# Patient Record
Sex: Female | Born: 1955
Health system: Southern US, Community
[De-identification: ages and names within clinical notes are randomized; demographics above are authoritative.]

## PROBLEM LIST (undated history)

## (undated) DIAGNOSIS — K219 Gastro-esophageal reflux disease without esophagitis: Secondary | ICD-10-CM

## (undated) DIAGNOSIS — I10 Essential (primary) hypertension: Secondary | ICD-10-CM

## (undated) DIAGNOSIS — K5900 Constipation, unspecified: Secondary | ICD-10-CM

## (undated) DIAGNOSIS — R002 Palpitations: Secondary | ICD-10-CM

## (undated) DIAGNOSIS — M549 Dorsalgia, unspecified: Secondary | ICD-10-CM

## (undated) DIAGNOSIS — E7800 Pure hypercholesterolemia, unspecified: Secondary | ICD-10-CM

## (undated) DIAGNOSIS — F32A Depression, unspecified: Secondary | ICD-10-CM

## (undated) DIAGNOSIS — N200 Calculus of kidney: Secondary | ICD-10-CM

## (undated) DIAGNOSIS — E559 Vitamin D deficiency, unspecified: Secondary | ICD-10-CM

## (undated) DIAGNOSIS — F419 Anxiety disorder, unspecified: Secondary | ICD-10-CM

## (undated) DIAGNOSIS — R609 Edema, unspecified: Secondary | ICD-10-CM

## (undated) DIAGNOSIS — R079 Chest pain, unspecified: Secondary | ICD-10-CM

## (undated) DIAGNOSIS — M2550 Pain in unspecified joint: Secondary | ICD-10-CM

## (undated) HISTORY — DX: Edema, unspecified: R60.9

## (undated) HISTORY — DX: Vitamin D deficiency, unspecified: E55.9

## (undated) HISTORY — DX: Constipation, unspecified: K59.00

## (undated) HISTORY — PX: NASAL SEPTUM SURGERY: SHX37

## (undated) HISTORY — DX: Dorsalgia, unspecified: M54.9

## (undated) HISTORY — DX: Gastro-esophageal reflux disease without esophagitis: K21.9

## (undated) HISTORY — DX: Palpitations: R00.2

## (undated) HISTORY — DX: Calculus of kidney: N20.0

## (undated) HISTORY — PX: ABDOMINAL HYSTERECTOMY: SHX81

## (undated) HISTORY — DX: Pure hypercholesterolemia, unspecified: E78.00

## (undated) HISTORY — PX: TONSILLECTOMY: SUR1361

## (undated) HISTORY — PX: FOOT SURGERY: SHX648

## (undated) HISTORY — DX: Chest pain, unspecified: R07.9

## (undated) HISTORY — DX: Essential (primary) hypertension: I10

## (undated) HISTORY — DX: Pain in unspecified joint: M25.50

## (undated) HISTORY — DX: Anxiety disorder, unspecified: F41.9

## (undated) HISTORY — DX: Depression, unspecified: F32.A

---

## 1997-06-26 ENCOUNTER — Other Ambulatory Visit: Admission: RE | Admit: 1997-06-26 | Discharge: 1997-06-26 | Payer: Self-pay | Admitting: Gynecology

## 1998-04-09 ENCOUNTER — Inpatient Hospital Stay (HOSPITAL_COMMUNITY): Admission: RE | Admit: 1998-04-09 | Discharge: 1998-04-11 | Payer: Self-pay | Admitting: Gynecology

## 2000-05-03 ENCOUNTER — Other Ambulatory Visit: Admission: RE | Admit: 2000-05-03 | Discharge: 2000-05-03 | Payer: Self-pay | Admitting: Gynecology

## 2001-05-24 ENCOUNTER — Other Ambulatory Visit: Admission: RE | Admit: 2001-05-24 | Discharge: 2001-05-24 | Payer: Self-pay | Admitting: Gynecology

## 2002-06-27 ENCOUNTER — Other Ambulatory Visit: Admission: RE | Admit: 2002-06-27 | Discharge: 2002-06-27 | Payer: Self-pay | Admitting: Gynecology

## 2004-06-05 ENCOUNTER — Other Ambulatory Visit: Admission: RE | Admit: 2004-06-05 | Discharge: 2004-06-05 | Payer: Self-pay | Admitting: Gynecology

## 2004-10-08 ENCOUNTER — Ambulatory Visit (HOSPITAL_COMMUNITY): Admission: RE | Admit: 2004-10-08 | Discharge: 2004-10-08 | Payer: Self-pay | Admitting: Urology

## 2004-10-15 ENCOUNTER — Ambulatory Visit (HOSPITAL_COMMUNITY): Admission: RE | Admit: 2004-10-15 | Discharge: 2004-10-15 | Payer: Self-pay | Admitting: Urology

## 2005-01-29 ENCOUNTER — Ambulatory Visit (HOSPITAL_COMMUNITY): Admission: RE | Admit: 2005-01-29 | Discharge: 2005-01-29 | Payer: Self-pay | Admitting: Urology

## 2005-01-30 ENCOUNTER — Ambulatory Visit: Payer: Self-pay | Admitting: Cardiology

## 2005-02-05 ENCOUNTER — Ambulatory Visit (HOSPITAL_COMMUNITY): Admission: RE | Admit: 2005-02-05 | Discharge: 2005-02-05 | Payer: Self-pay | Admitting: Urology

## 2006-03-18 ENCOUNTER — Other Ambulatory Visit: Admission: RE | Admit: 2006-03-18 | Discharge: 2006-03-18 | Payer: Self-pay | Admitting: Family Medicine

## 2006-04-06 ENCOUNTER — Ambulatory Visit: Payer: Self-pay | Admitting: Cardiology

## 2006-04-19 ENCOUNTER — Ambulatory Visit: Payer: Self-pay

## 2006-05-17 ENCOUNTER — Ambulatory Visit: Payer: Self-pay | Admitting: Cardiology

## 2006-09-27 IMAGING — CR DG ABDOMEN 1V
2 series · 2 of 2 positions shown · non-contrast
Comparison: none available

HISTORY: left renal calculus, pre lithotripsy

ABDOMEN ONE VIEW:

[view not recorded (1 of 2)]
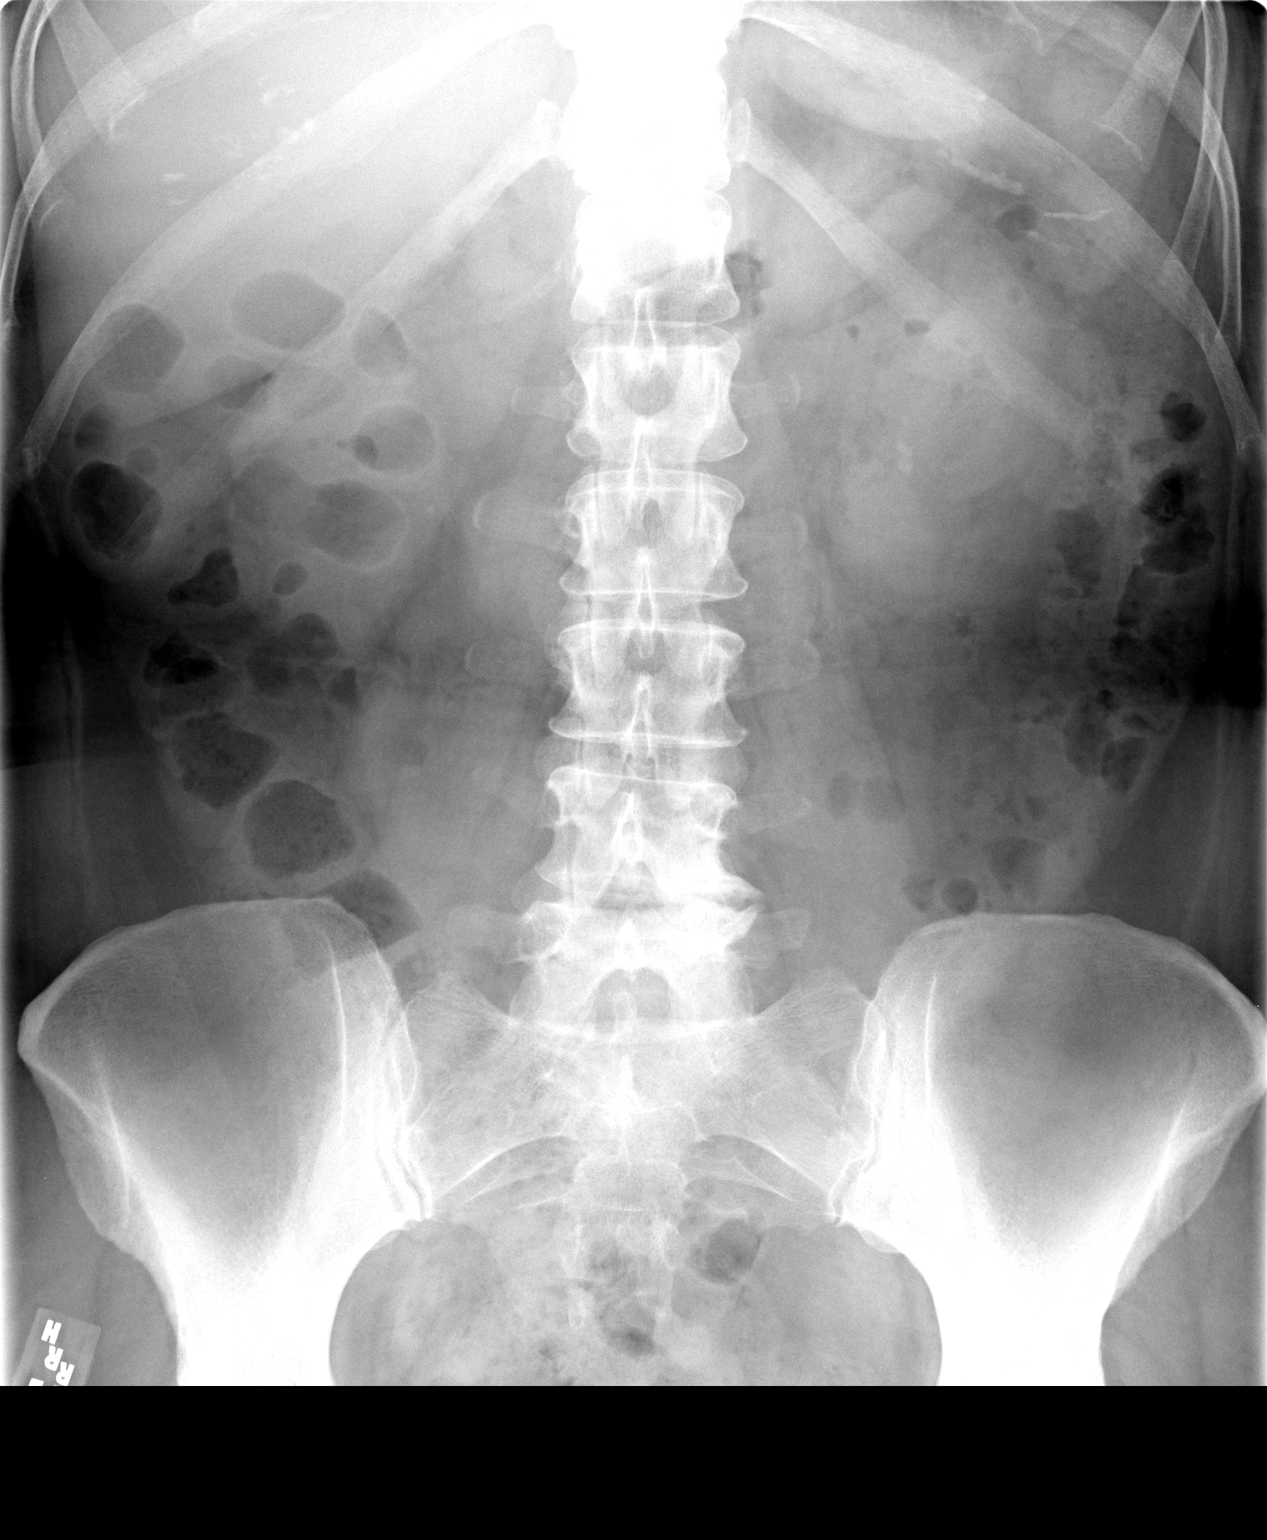

[view not recorded (2 of 2)]
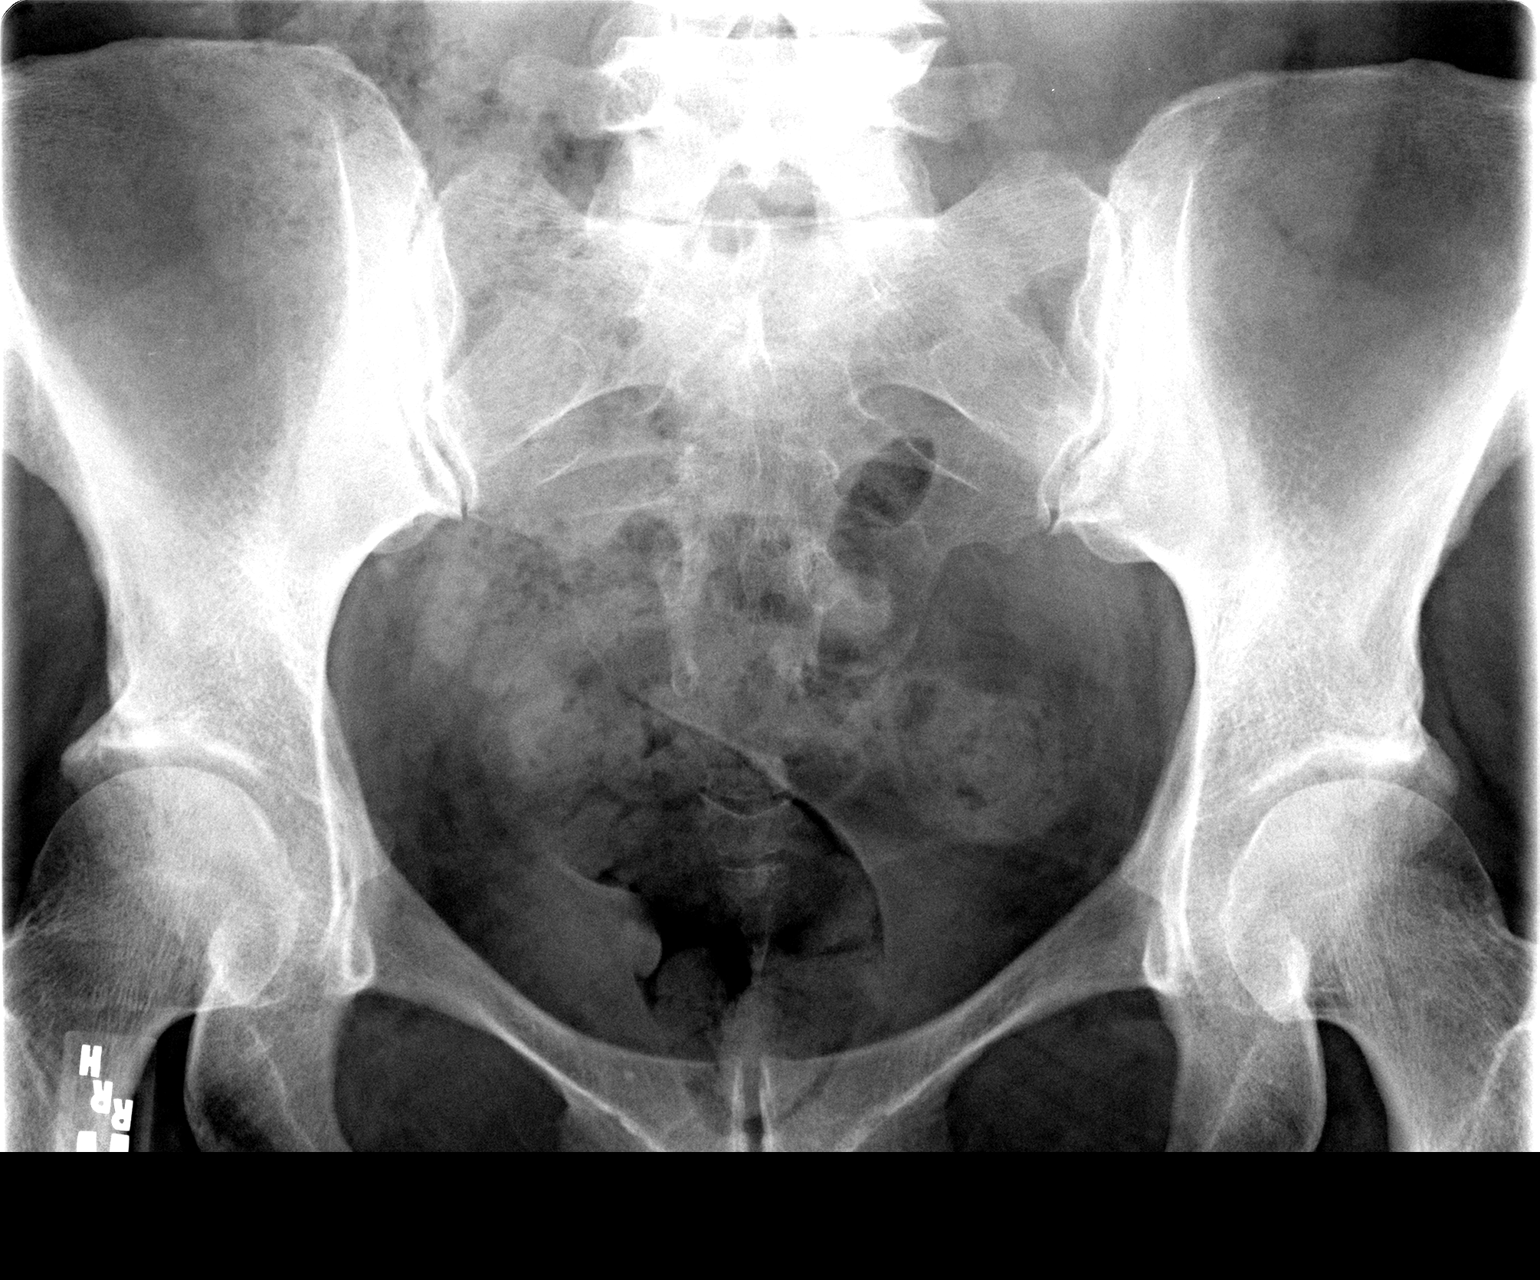

[2 of 2 positions shown; findings below may reference images not displayed]

10 x 4 mm diameter calculus lower pole left kidney.
No ureteral calcification.
Questionable tiny calcification at right renal pelvis, 1 to 2 mm diameter.
Bowel gas pattern normal.
Degenerative disc disease changes lumbar spine at L4-L5.
No acute bone lesion.
IMPRESSION: 10 x 4 mm diameter left renal calculus.
Question tiny right renal calculus.

## 2007-01-18 IMAGING — CR DG ABDOMEN 1V
1 series · 1 of 1 positions shown · non-contrast
Comparison: none

CLINICAL DATA: Left ureteral calculus.  Pre-op lithotripsy.
 ABDOMEN ? 1 VIEW ? 01/29/05:

[t abdomen supine]
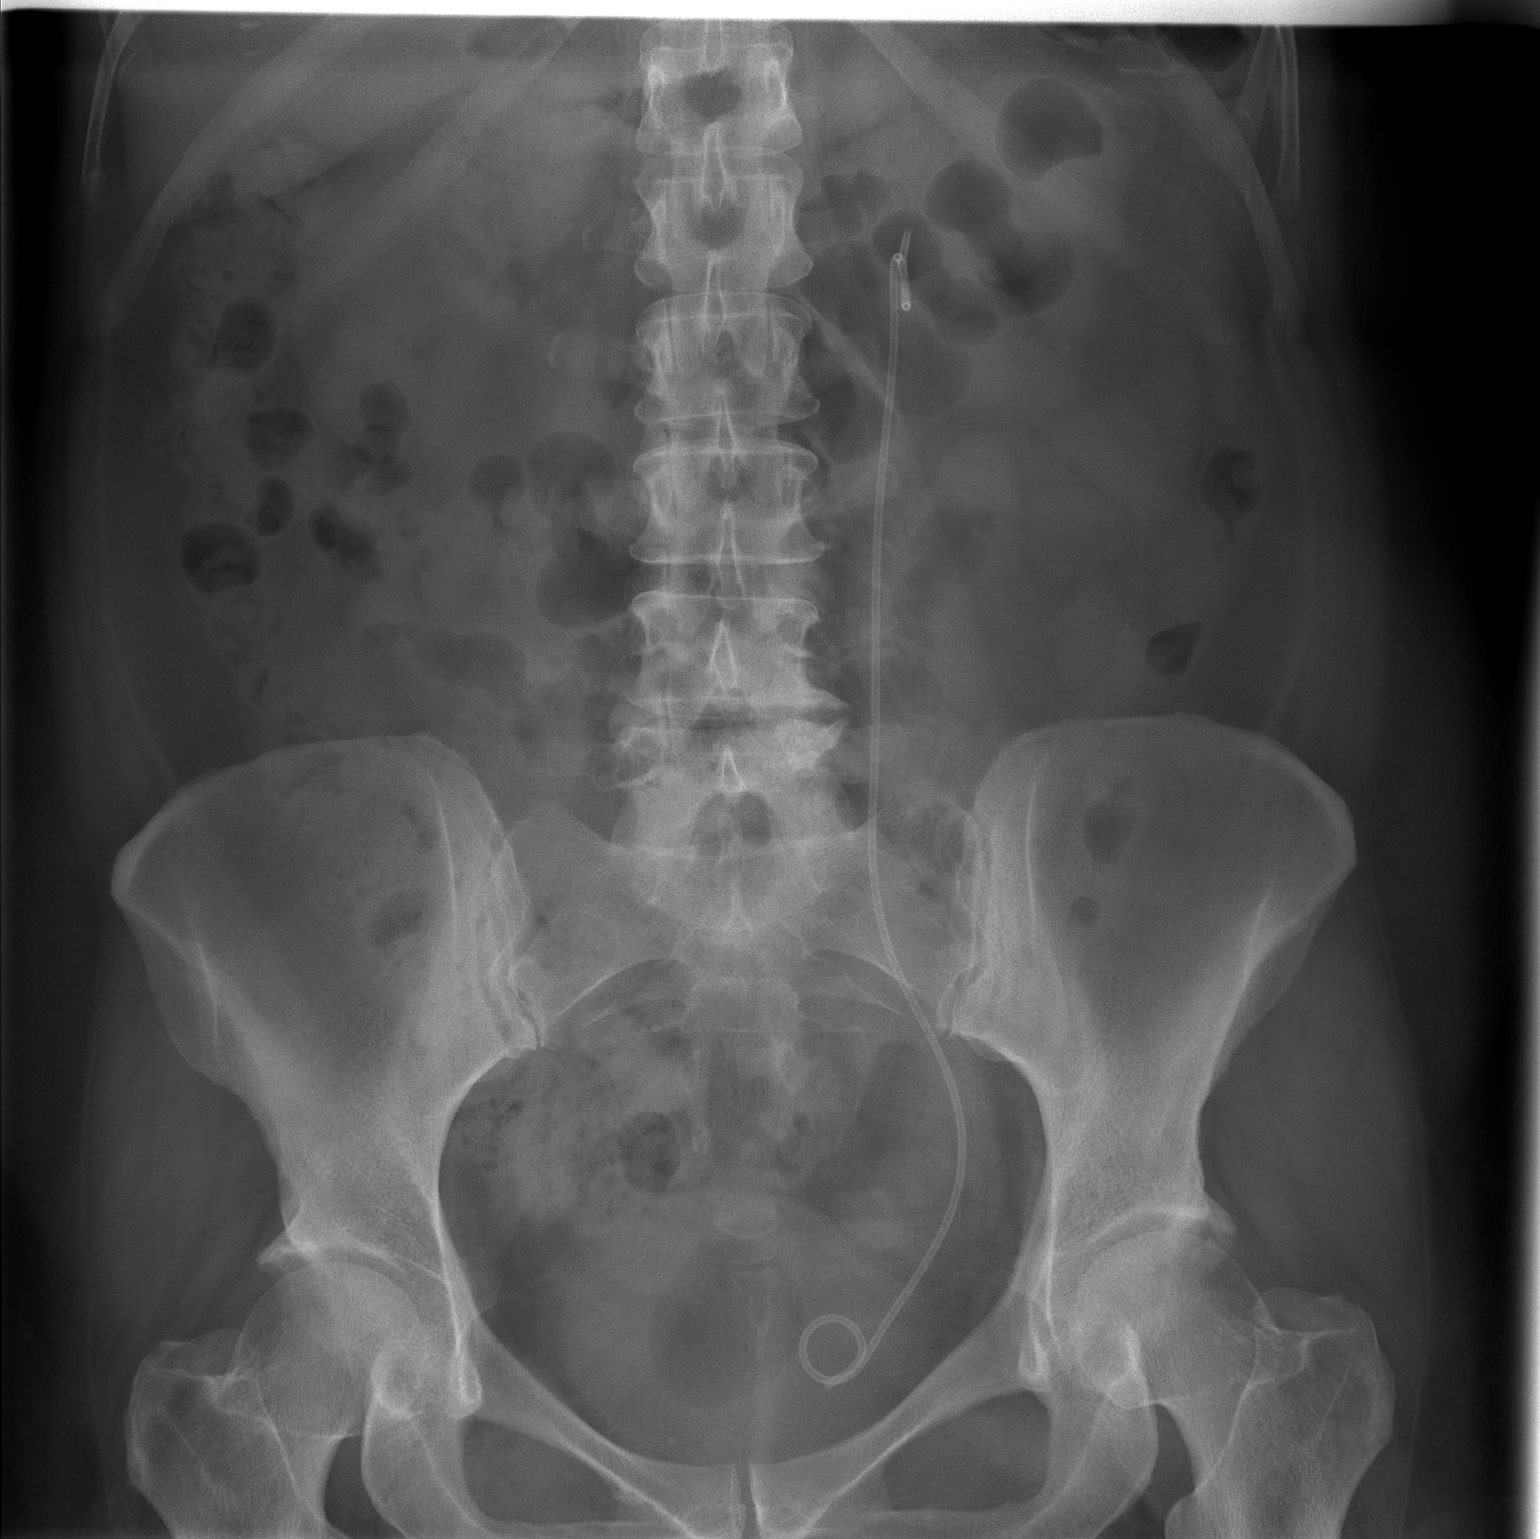

[1 of 1 positions shown; findings below may reference images not displayed]

FINDINGS: AP view of the abdomen reveals a Double-J catheter to be in position on the left.
IMPRESSION: Left ureteral catheter.

## 2007-01-18 IMAGING — RF DG RETROGRADE PYELOGRAM
1 series · 10 of 10 positions shown · non-contrast
Comparison: none

CLINICAL DATA: Left ureteral calculus.
 RETROGRADE PYELOGRAM:

[Series 1: run · 4 acquisitions, 10 frames shown]
[im 1/4]
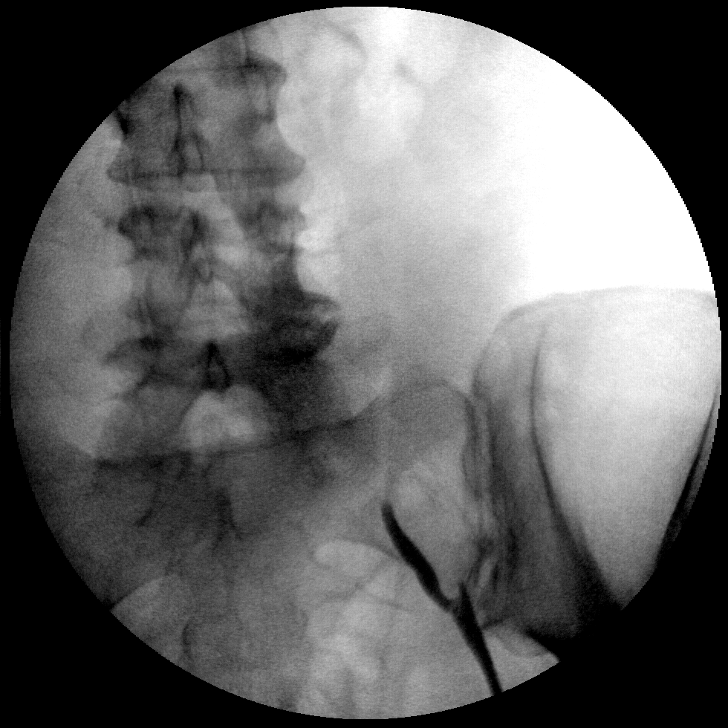
[im 1/4]
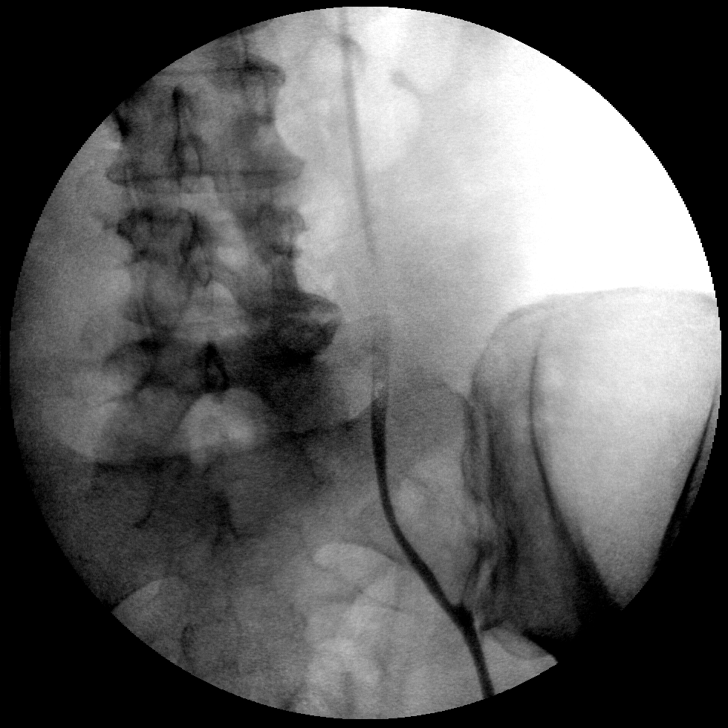
[im 1/4]
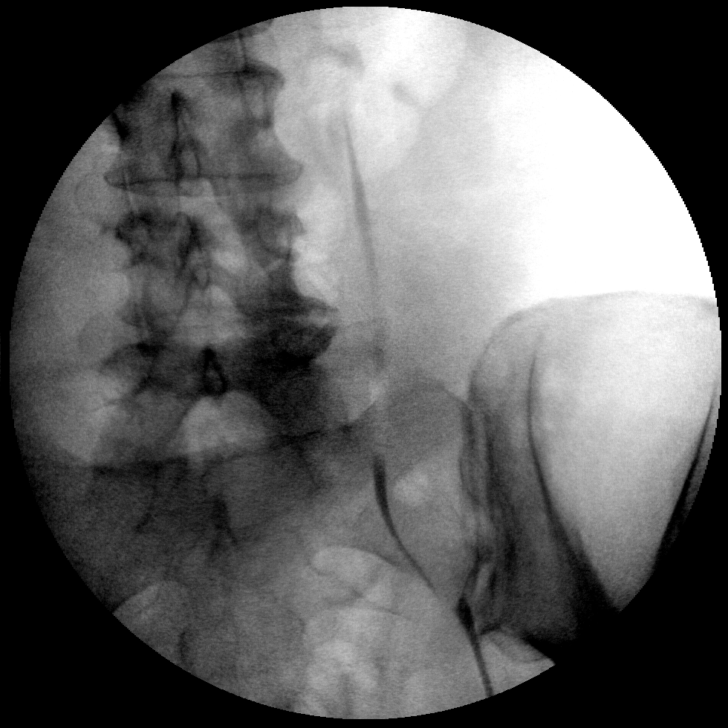
[im 1/4]
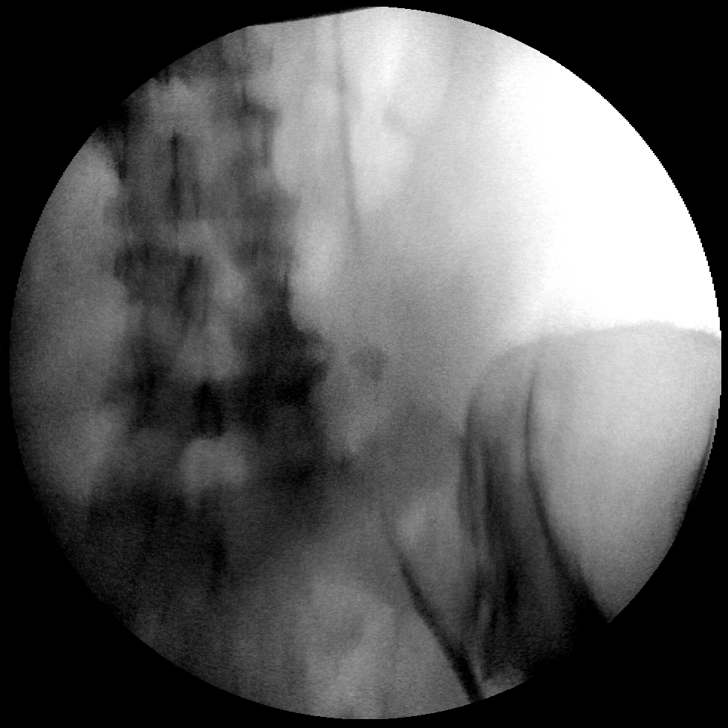
[im 2/4]
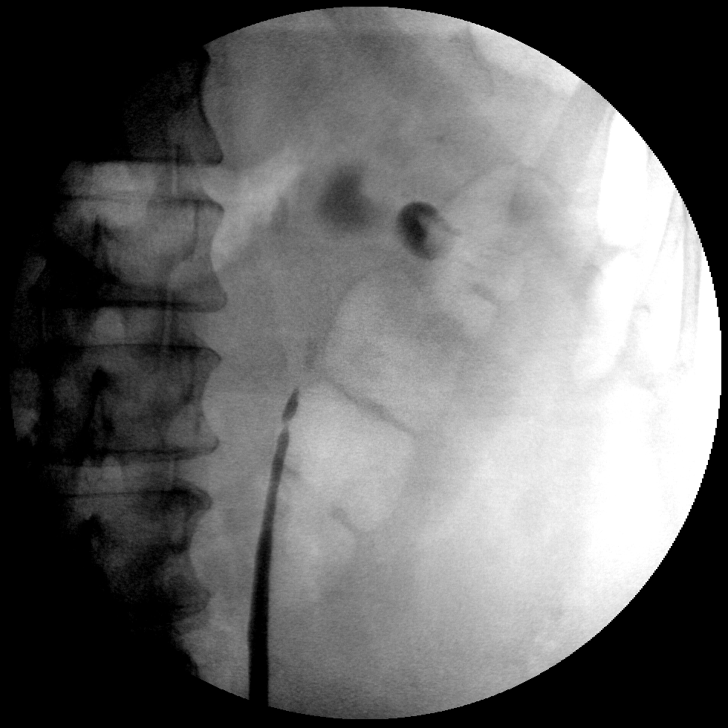
[im 2/4]
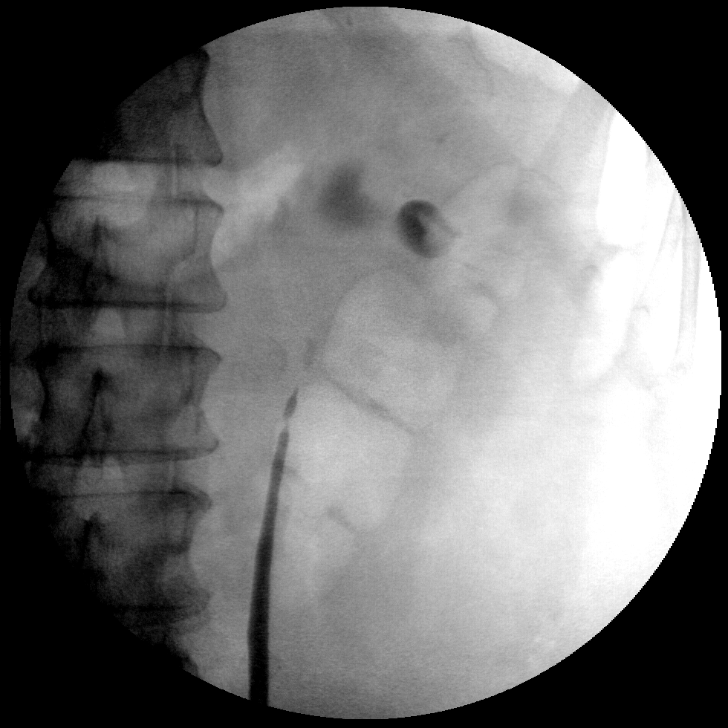
[im 2/4]
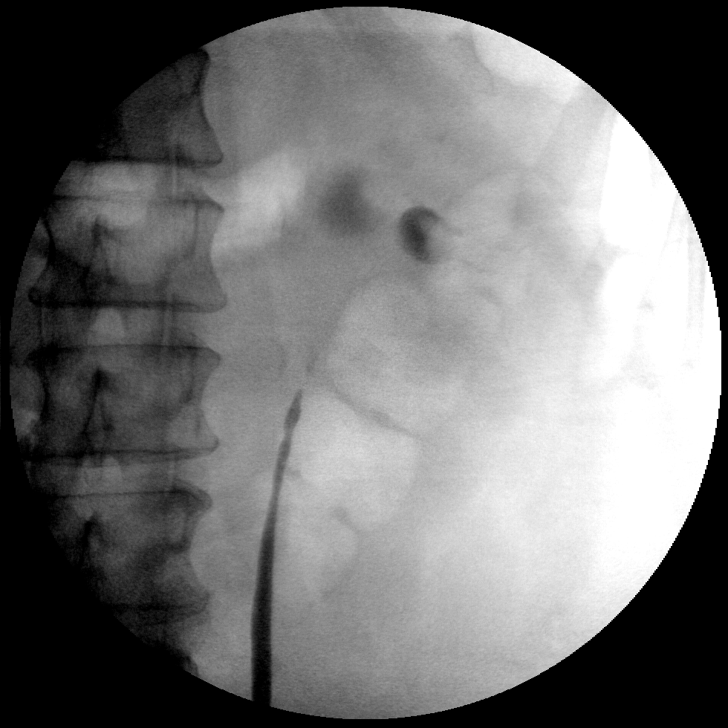
[im 2/4]
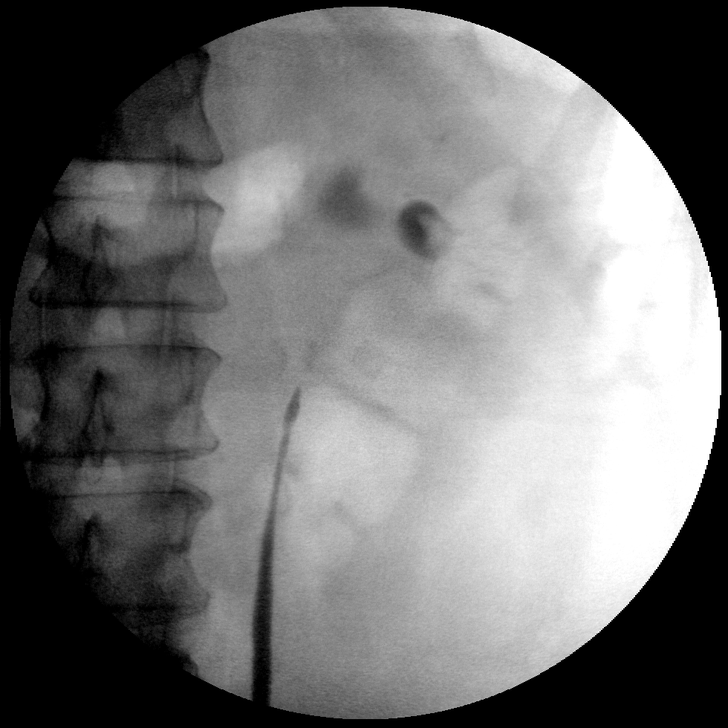
[im 3/4]
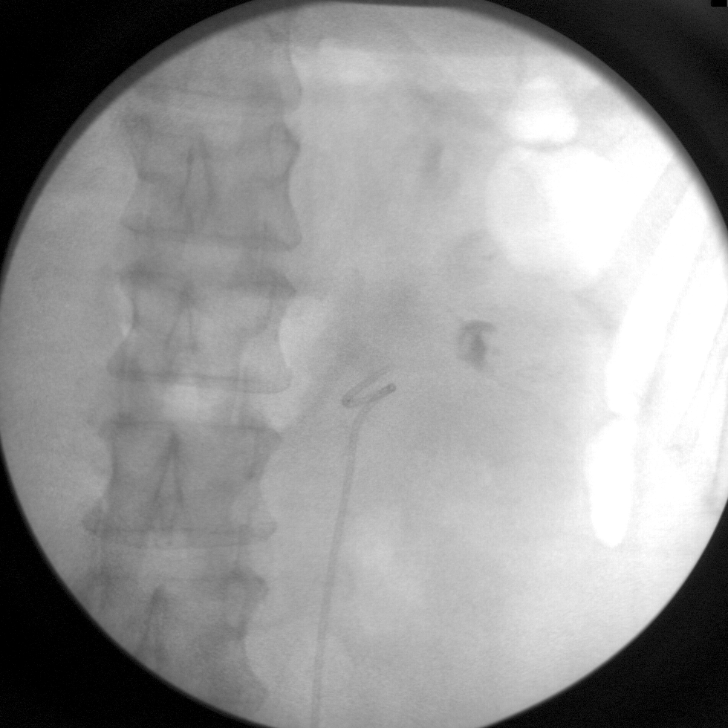
[im 4/4]
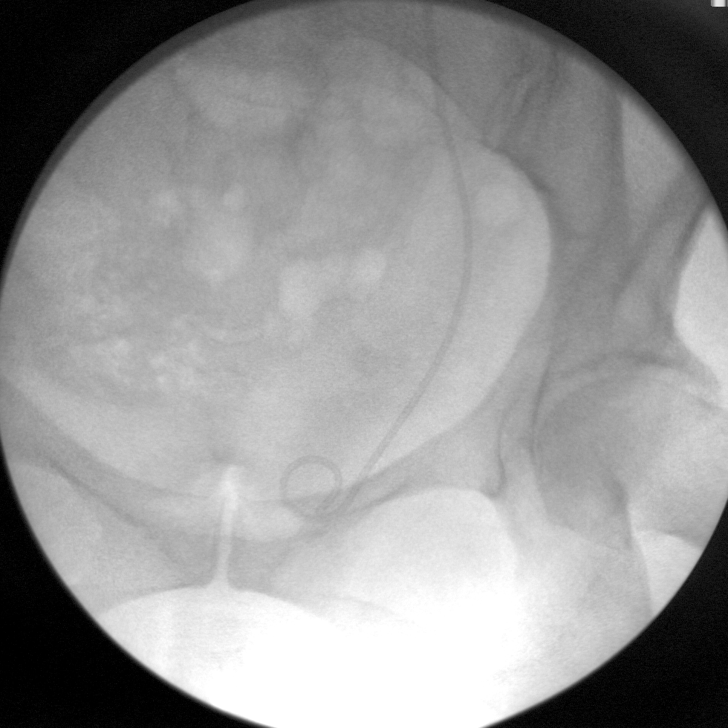

[10 of 10 positions shown; findings below may reference images not displayed]

FINDINGS: Left double-J catheter is positioned with the proximal portion of the cath being in the region of the left renal pelvis.  Contrast is noted in the left renal calyces with questionable stones.
IMPRESSION: Left double-J catheter is visualized.

## 2007-01-18 IMAGING — CR DG ABDOMEN 2V
2 series · 2 of 2 positions shown · non-contrast
Comparison: none

CLINICAL DATA: Left renal calculus.
 ABDOMEN ? 2 VIEW:

[view not recorded (1 of 2)]
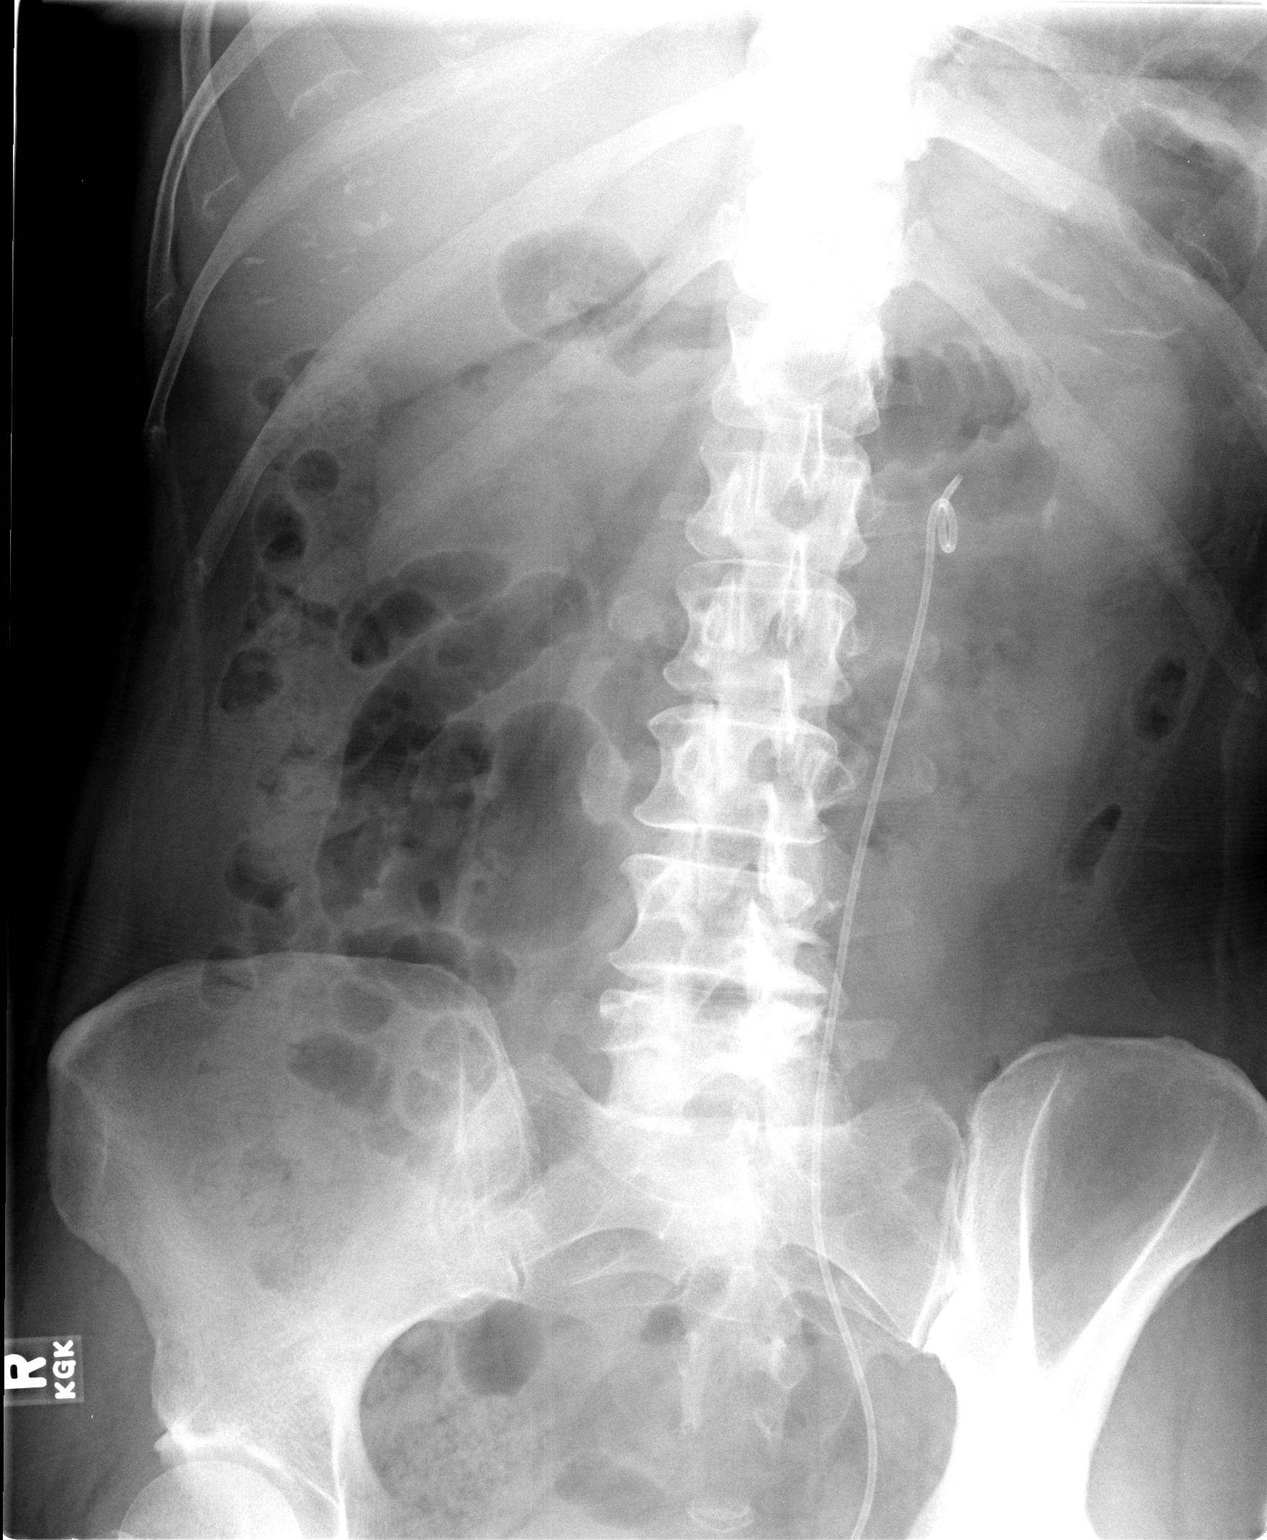

[view not recorded (2 of 2)]
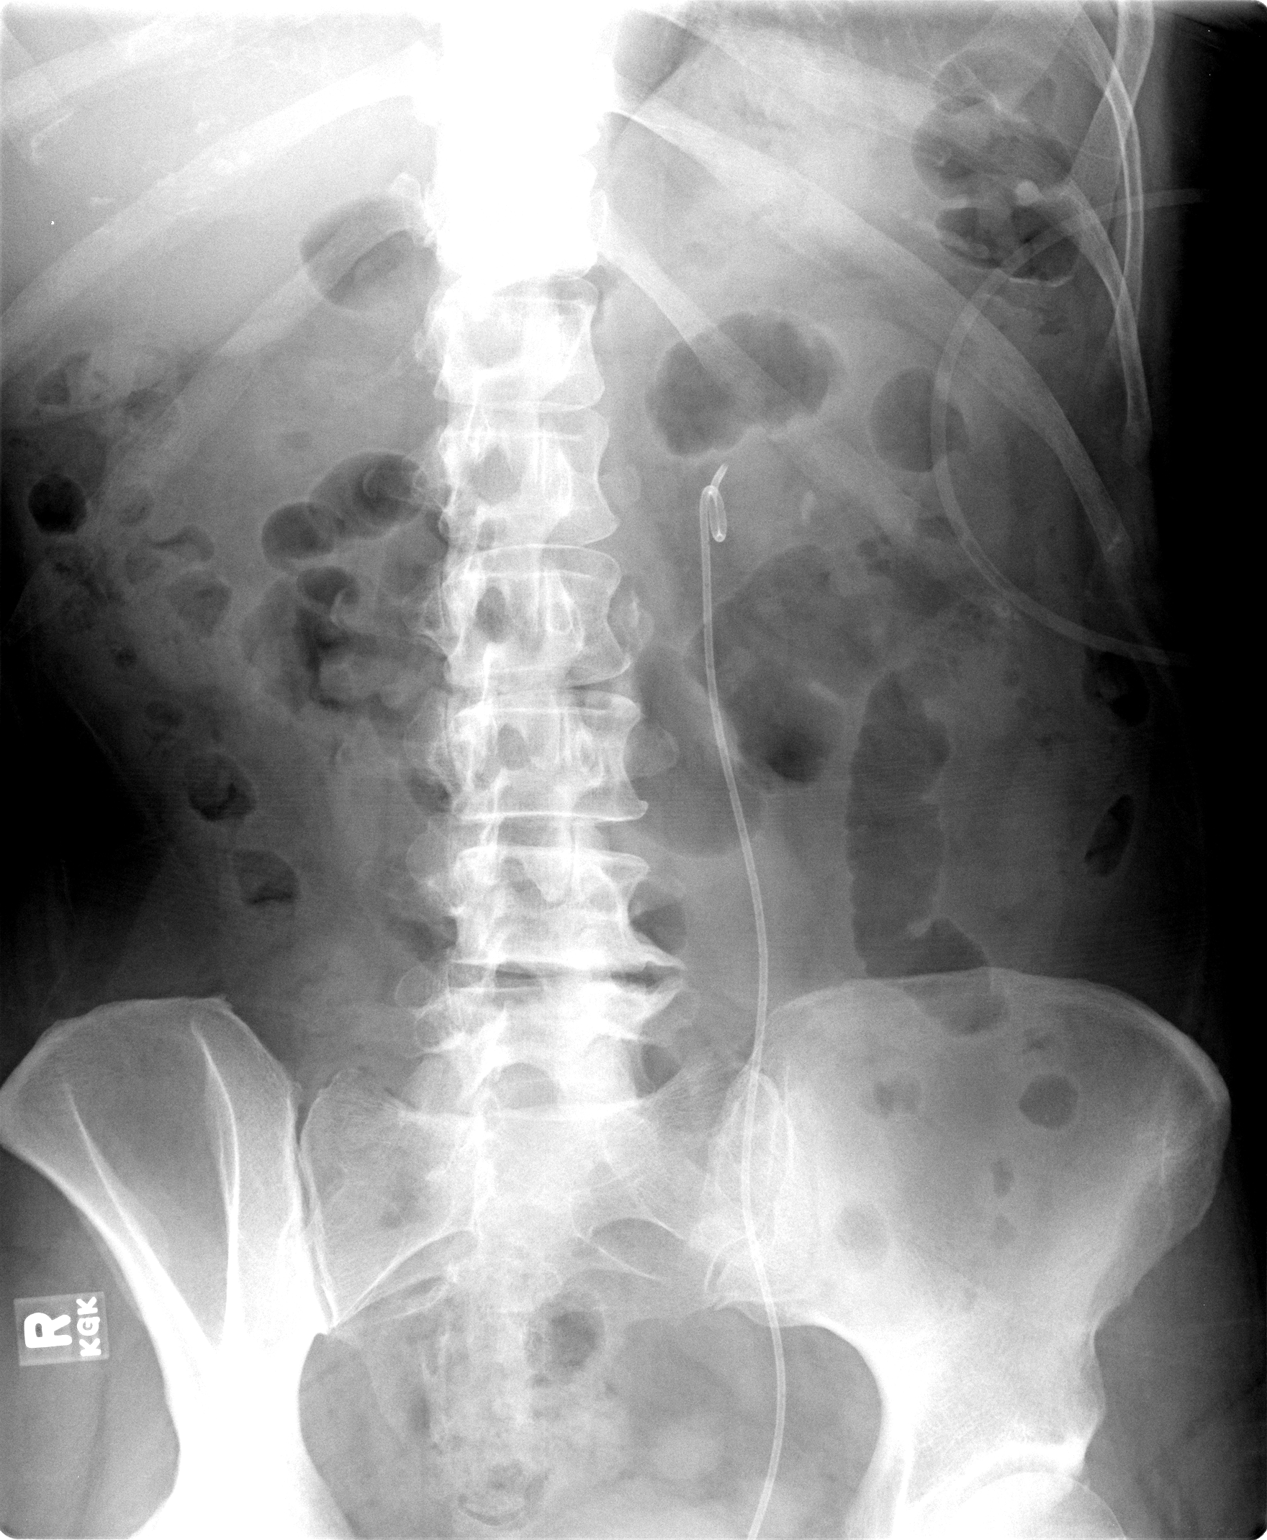

[2 of 2 positions shown; findings below may reference images not displayed]

FINDINGS: AP and left posterior oblique views reveal a 1 cm calculus overlying the left kidney.
IMPRESSION: Left renal calculus.

## 2008-03-26 ENCOUNTER — Ambulatory Visit (HOSPITAL_COMMUNITY): Admission: RE | Admit: 2008-03-26 | Discharge: 2008-03-26 | Payer: Self-pay | Admitting: Urology

## 2010-06-27 NOTE — Op Note (Signed)
NAME:  Jane Howard, Jane Howard NO.:  [id number]   MEDICAL RECORD NO.:  [id number]          PATIENT TYPE:  AMB   LOCATION:  DAY                          FACILITY:  [facility]   PHYSICIAN:  Claudette Laws, M.D.  DATE OF BIRTH:  08-16-1955   DATE OF PROCEDURE:  01/29/2005  DATE OF DISCHARGE:  01/29/2005                                 OPERATIVE REPORT   PREOPERATIVE DIAGNOSIS:  A 6 mm obstructing proximal left ureteral stone.   POSTOPERATIVE DIAGNOSIS:  A 6 mm obstructing proximal left ureteral stone.   OPERATION:  1.  Cystoscopy.  2.  Left retrograde pyeloureterogram.  3.  Insertion of a 6-French 26 cm double-J stent.   SURGEON:  Dr. Etta Grandchild   PROCEDURE:  The patient was prepped and draped in the dorsolithotomy  position under LMA anesthesia.  Cystoscopy was performed with a 22-French  rigid cystoscope.  Bladder urine was submitted for a culture.  The bladder  itself was grossly normal, no tumors, no calculi.  The left ureteral orifice  was somewhat displaced laterally.  However, I was able to intubate it with a  6-French open-ended ureteral catheter, and we took retrograde studies which  showed what appeared to be a rather high grade hydronephrosis.  Initially, I  tried to pass up a 0.038 [place] wire, and it appeared that the stone was  impacted.  Also, I switched to a 0.038 Glidewire without much success.  Therefore. I injected approximately 3-4 mL of a mixture of Xylocaine and  saline, and eventually I was able to negotiate the Glidewire by the stone  using fluoroscopic control.  I then passed up a 6-French 26 cm double-J  stent, and the double-J stent seemed to settle in the dependent portion of  her pelvis.  The distal end was curled up in the bladder.  The bladder was  emptied.  All instruments were removed, and she was taken back to the  recovery room in satisfactory condition.  She was given a B&O suppository.  She is now going to the lithotripsy truck for attempt  at lithotripsy.      Claudette Laws, M.D.  Electronically Signed     RFS/MEDQ  D:  01/29/2005  T:  02/02/2005  Job:  [id number]

## 2010-06-27 NOTE — H&P (Signed)
NAME:  JAHLISA, ROSSITTO               ACCOUNT NO.:  [id number]   MEDICAL RECORD NO.:  [id number]          PATIENT TYPE:  AMB   LOCATION:                                FACILITY:  [facility]   PHYSICIAN:  Ky Barban, M.D.DATE OF BIRTH:  02/28/1955   DATE OF ADMISSION:  DATE OF DISCHARGE:  LH                                HISTORY & PHYSICAL   CHIEF COMPLAINT:  Left renal colic.   HISTORY:  A 55 year old female was seen by me August 17 with on and off left  renal colic.  CT scan shows 8 mm stone in the left ureteropelvic junction  causing hydronephrosis.  The patient was advised to have ESL; however, she  is coming as outpatient.  Procedure, its nature, how it works, and need for  additional procedure was discussed. She understands and wants me to go ahead  and schedule.   PAST MEDICAL HISTORY:  1.  Hysterectomy for prolapse.  2.  Hypertension.   No diabetes.   FAMILY HISTORY:  One brother has kidney stones.   SOCIAL HISTORY:  Does not smoke.  Drinks occasionally.   REVIEW OF SYSTEMS:  Unremarkable.   PHYSICAL EXAMINATION:  VITAL SIGNS:  Blood pressure 116/75, temperature  98.2.  ABDOMEN:  Soft, flat.  Liver, spleen, kidneys not palpable, 1+ left CVA  tenderness.  PELVIC:  Exam is unremarkable.  CENTRAL NERVOUS SYSTEM:  No gross neurological deficits.  HEAD AND NECK:  Normal.  CHEST:  Symmetrical.   IMPRESSION:  Left renal calculus.   PLAN:  ESL left renal calculus.      Ky Barban, M.D.  Electronically Signed     MIJ/MEDQ  D:  10/07/2004  T:  10/07/2004  Job:  [id number]   cc:   Ernestina Penna, M.D.  [address] [place]  [place] [address]  Fax: [phone]

## 2010-06-27 NOTE — Assessment & Plan Note (Signed)
[facility]                            CARDIOLOGY OFFICE NOTE   NAME:Steppe, ARMONI KLUDT                      MRN:          [id number]  DATE:04/06/2006                            DOB:          22-May-1955    Mrs. Aguado is a very pleasant 55 year old female with past medical  history of hypertension, recently diagnosed hyperlipidemia,  nephrolithiasis, who presents for evaluation of an irregular heartbeat  and palpitations.  She has been seen in this office in the past for a  left bundle branch block.  She had a Myoview performed in July of 2005.  There was an ejection fraction of 69%.  There was a defect felt  consistent with patient's left bundle branch block, but there was no  ischemia.  An echocardiogram was also performed which showed normal LV  function and trace mitral and tricuspid regurgitation.  She recently  gave blood in December.  She was told that she had an irregular  heartbeat.  She subsequently noticed that she occasionally felt her  heart skip.  There is no associated syncope, chest pain or shortness  of breath.  She saw Dr. Christell Constant recently for routine evaluation and  mentioned the irregular heartbeat, and we were asked to further  evaluate.  Of note, she does not have orthopnea or PND, but she does  have a long history of mild pedal edema.  She can have pain in her arms  bilaterally when she exerts herself that is relieved with rest.  There  is no exertional chest pain.  She also has mild dyspnea on exertion.   MEDICATIONS:  1. Benicar 40 mg p.o. daily.  2. Aspirin 81 mg p.o. daily.  3. Lexapro 10 mg p.o. daily.   SHE IS ALLERGIC TO MACRODANTIN AND VICODIN.   SOCIAL HISTORY:  She does not smoke, and only rarely consumes alcohol.   FAMILY HISTORY:  Her family history is positive for coronary artery  disease in both her mother and her father.   PAST MEDICAL HISTORY:  Significant for hypertension as well as  hyperlipidemia.  There is  no diabetes mellitus.  She has a history of  nephrolithiasis.  She has had a previous hysterectomy and a  tonsillectomy.   REVIEW OF SYSTEMS:  She denies any headaches, or fevers and chills.  There is no productive cough or hemoptysis.  There is no dysphagia,  odynophagia, melena or hematochezia.  There is no dysuria, hematuria.  There is no rash or seizure activity.  There is no orthopnea or PND, but  there is mild pedal edema which is a chronic issue.  The remaining  systems are negative.   PHYSICAL EXAMINATION:  Shows a blood pressure of 136/80, and her pulse  is 91, she weighs 184 pounds.  She is well developed to mildly obese.  She is in no acute distress.  Her skin is warm and dry.  She does not appear depressed, and there is  no peripheral clubbing.  Her HEENT is unremarkable with normal eyelids.  Her neck is supple with a normal upstroke bilaterally, and I cannot  appreciate bruits.  There is no jugular venous distension, and  thyromegaly is noted.  Her chest is clear to auscultation with normal expansion.  CARDIOVASCULAR EXAM:  Reveals a regular rate and rhythm.  There are  occasional skipped beats that sound to be PVCs.  There is a 1/6 systolic  ejection murmur.  There is no S3 or S4.  There is no rub noted.  PMI is  nondisplaced.  ABDOMINAL EXAM:  Not tender or distended.  Positive bowel sounds.  No  hepatosplenomegaly, no mass appreciated.  There is no abdominal bruit.  She has 2+ femoral pulses bilaterally and no bruits.  EXTREMITIES:  Show no edema and I could palpate no cords.  She has 2+  posterior tibial pulses bilaterally.  NEUROLOGIC EXAM:  Grossly intact.   Her electrocardiogram shows a sinus rhythm with occasional PVCs.  There  is a left bundle branch block.   DIAGNOSES:  1. Palpitations secondary to PVCs.  2. Exertional arm discomfort.  3. Hypertension.  4. Hyperlipidemia.  5. History of left bundle branch block.   PLAN:  Mrs. Fischman presents for  evaluation of palpitations.  She is noted  to have PVCs on an electrocardiogram.  I think this is responsible for  what was described as an irregular heart rhythm at the time she gave  blood.  She really is not extremely symptomatic from this, as she has  only describing an occasional skipping since she was told that she had  an irregular heart rhythm.  There is no syncope, dyspnea, or chest pain  associated with these palpitations.  She does have exertional bilateral  arm discomfort that is somewhat concerning.  We will plan to risk  stratify with an adenosine Myoview.  If it shows normal perfusion, then  we will continue with medical therapy.  She will need to continue with  risk factor modification, including diet and exercise.  We will see her  back in 6 weeks to make sure that she is stable.     Madolyn Frieze Jens Som, MD, [facility]  Electronically Signed    BSC/MedQ  DD: 04/06/2006  DT: 04/06/2006  Job #: [id number]   cc:   Ernestina Penna, M.D.

## 2010-06-27 NOTE — Assessment & Plan Note (Signed)
[facility]                            CARDIOLOGY OFFICE NOTE   NAME:Schoeppner, RONYA GILCREST                      MRN:          [id number]  DATE:05/17/2006                            DOB:          October 07, 1955    Mrs. Steinhauser is a 55 year old female with hypertension, hyperlipidemia, who  I recently saw for palpitations that did not seem to be bothersome. She  also is complaining of bilateral arm discomfort with exertion that has  been going on for several years. We did schedule her to have a Myoview  which was performed on April 19, 2006. Her ejection fraction was noted  to be 68% with normal LV function and wall motion. There was a distal  anterior, mid, and distal interseptal and atypical defect that partially  improved towards the apex. This was felt to possibly be soft tissue  attenuation but as some possible small region of distal ischemia can not  be excluded. It was felt to be low risk. Since then she has not had  chest pain, dyspnea, palpitations, or syncope. She continues to have  occasional arm discomfort when she exerts herself and wonders if there  may be a neck issue. Her medications include;  1. Benicar 40 mg p.o. daily.  2. Aspirin 81 mg p.o. daily.   PHYSICAL EXAMINATION:  Shows a blood pressure of 123/77, her pulse is  89.  CHEST: Clear.  CARDIOVASCULAR EXAM: Reveals a regular rate and rhythm.  EXTREMITIES: Show no edema.   DIAGNOSES:  1. Palpitations felt secondary to PVCs improved.  2. Exertional  arm discomfort.  3. Mild abnormal nuclear study.  4. Hypertension.  5. Hyperlipidemia.   PLAN:  Mrs. Luera is continuing to have some exertional bilateral arm  discomfort. Her Myoview was low risk but there was a question of distal  anterior/apical ischemia. I feel that she would best be served by a  cardiac CT to exclude coronary disease. She is agreeable to this and we  will arrange this. Note her palpitations have improved. We will see  her  back in 6 months. She will continue the risk factor modification.     Madolyn Frieze Jens Som, MD, [facility]  Electronically Signed    BSC/MedQ  DD: 05/17/2006  DT: 05/17/2006  Job #: [id number]   cc:   Ernestina Penna, M.D.

## 2012-08-30 ENCOUNTER — Ambulatory Visit (INDEPENDENT_AMBULATORY_CARE_PROVIDER_SITE_OTHER): Payer: 59 | Admitting: Family Medicine

## 2012-08-30 ENCOUNTER — Encounter: Payer: Self-pay | Admitting: Family Medicine

## 2012-08-30 VITALS — BP 132/83 | HR 80 | Temp 98.7°F | Ht 66.0 in | Wt 190.0 lb

## 2012-08-30 DIAGNOSIS — F411 Generalized anxiety disorder: Secondary | ICD-10-CM

## 2012-08-30 DIAGNOSIS — F4321 Adjustment disorder with depressed mood: Secondary | ICD-10-CM

## 2012-08-30 MED ORDER — CLONAZEPAM 0.5 MG PO TABS: 0.5000 mg | ORAL_TABLET | Freq: Two times a day (BID) | ORAL | Status: DC | PRN

## 2012-08-30 NOTE — Progress Notes (Signed)
  Subjective:    Patient ID: Jane Howard, female    DOB: May 20, 1955, 57 y.o.   MRN: [id number]  HPI Pt presents today with chie complaint of anxiety  Pt states that her father died last week.  Since this point she has had trouble sleeping as well as generalized agitation.  Prior hx/o anxiety and depression in the past that has been asymptomatic for many years.  Has taken lexapro in the remote past but has not used.  Had to use her mother's xanax over the weekend to help with nerves.  No HI/SI    Review of Systems  All other systems reviewed and are negative.       Objective:   Physical Exam  Constitutional: She appears well-developed and well-nourished.  Mildly tearful    HENT:  Head: Normocephalic and atraumatic.  Eyes: Conjunctivae are normal. Pupils are equal, round, and reactive to light.  Neck: Normal range of motion. Neck supple.  Cardiovascular: Normal rate, regular rhythm and normal heart sounds.   Pulmonary/Chest: Effort normal and breath sounds normal.  Abdominal: Soft.  Musculoskeletal: Normal range of motion.  Neurological: She is alert.  Skin: Skin is warm.          Assessment & Plan:  Anxiety state, unspecified - Plan: clonazePAM (KLONOPIN) 0.5 MG tablet  Grief reaction  Will start on klonopin for acute treatment Discussed restarting lexapro/SSRI  Will hold off on this pending follo wup in 2 weeks.  Discussed psych red flags.

## 2012-09-13 ENCOUNTER — Ambulatory Visit: Payer: 59 | Admitting: Family Medicine

## 2012-09-16 ENCOUNTER — Telehealth: Payer: Self-pay | Admitting: Nurse Practitioner

## 2012-09-16 NOTE — Telephone Encounter (Signed)
Patient told will need to be seen

## 2012-09-17 ENCOUNTER — Ambulatory Visit (INDEPENDENT_AMBULATORY_CARE_PROVIDER_SITE_OTHER): Payer: 59 | Admitting: General Practice

## 2012-09-17 VITALS — BP 137/74 | HR 79 | Temp 99.2°F | Ht 66.0 in | Wt 192.0 lb

## 2012-09-17 DIAGNOSIS — L0390 Cellulitis, unspecified: Secondary | ICD-10-CM

## 2012-09-17 DIAGNOSIS — L0291 Cutaneous abscess, unspecified: Secondary | ICD-10-CM

## 2012-09-17 MED ORDER — CEPHALEXIN 500 MG PO CAPS: 500.0000 mg | ORAL_CAPSULE | Freq: Three times a day (TID) | ORAL | Status: DC

## 2012-09-17 NOTE — Patient Instructions (Signed)
Cellulitis Cellulitis is an infection of the skin and the tissue beneath it. The infected area is usually red and tender. Cellulitis occurs most often in the arms and lower legs.  CAUSES  Cellulitis is caused by bacteria that enter the skin through cracks or cuts in the skin. The most common types of bacteria that cause cellulitis are Staphylococcus and Streptococcus. SYMPTOMS   Redness and warmth.  Swelling.  Tenderness or pain.  Fever. DIAGNOSIS  Your caregiver can usually determine what is wrong based on a physical exam. Blood tests may also be done. TREATMENT  Treatment usually involves taking an antibiotic medicine. HOME CARE INSTRUCTIONS   Take your antibiotics as directed. Finish them even if you start to feel better.  Keep the infected arm or leg elevated to reduce swelling.  Apply a warm cloth to the affected area up to 4 times per day to relieve pain.  Only take over-the-counter or prescription medicines for pain, discomfort, or fever as directed by your caregiver.  Keep all follow-up appointments as directed by your caregiver. SEEK MEDICAL CARE IF:   You notice red streaks coming from the infected area.  Your red area gets larger or turns dark in color.  Your bone or joint underneath the infected area becomes painful after the skin has healed.  Your infection returns in the same area or another area.  You notice a swollen bump in the infected area.  You develop new symptoms. SEEK IMMEDIATE MEDICAL CARE IF:   You have a fever.  You feel very sleepy.  You develop vomiting or diarrhea.  You have a general ill feeling (malaise) with muscle aches and pains. MAKE SURE YOU:   Understand these instructions.  Will watch your condition.  Will get help right away if you are not doing well or get worse. Document Released: 11/05/2004 Document Revised: 07/28/2011 Document Reviewed: 04/13/2011 Exit[employer] Patient Information 2014 [employer].  

## 2012-09-17 NOTE — Progress Notes (Signed)
  Subjective:    Patient ID: Jane Howard, female    DOB: 26-Dec-1955, 57 y.o.   MRN: [id number]  HPI Patient presents today with complaints of redness and burning to left foot and lower leg. Reports onset was two days ago and worsening. She reports possibly being bitten on her great toe one week ago, but unsure of insect. She denies OTC medications. She reports having cellulitis in this foot in the past.     Review of Systems  Constitutional: Negative for fever and chills.  Respiratory: Negative for chest tightness and shortness of breath.   Cardiovascular: Negative for chest pain and palpitations.  Neurological: Negative for dizziness, weakness and headaches.       Objective:   Physical Exam  Constitutional: She is oriented to person, place, and time. She appears well-developed and well-nourished.  Cardiovascular: Normal rate, regular rhythm and normal heart sounds.   Pulmonary/Chest: Effort normal and breath sounds normal. No respiratory distress. She has no rales.  Neurological: She is alert and oriented to person, place, and time.  Skin: Skin is warm and dry. There is erythema.  Left lower leg are 1/2 inch x 1/2 inch, erythematous and warm to touch negative for drainage. Also left outer foot same measurement and description. Both tender to touch.  Psychiatric: She has a normal mood and affect.          Assessment & Plan:  1. Cellulitis - cephALEXin (KEFLEX) 500 MG capsule; Take 1 capsule (500 mg total) by mouth 3 (three) times daily.  Dispense: 30 capsule; Refill: 0 -discussed keep skin clean and dry and taking medications as prescribed -Cellulitis information sheets provided and discussed -RTO if symptoms worsen or unresolved -Patient verbalized understanding -Coralie Keens, FNP-C

## 2013-01-26 ENCOUNTER — Other Ambulatory Visit: Payer: Self-pay | Admitting: Obstetrics and Gynecology

## 2013-01-26 DIAGNOSIS — R928 Other abnormal and inconclusive findings on diagnostic imaging of breast: Secondary | ICD-10-CM

## 2013-02-01 ENCOUNTER — Ambulatory Visit (INDEPENDENT_AMBULATORY_CARE_PROVIDER_SITE_OTHER): Payer: 59 | Admitting: Family Medicine

## 2013-02-01 VITALS — BP 142/81 | HR 82 | Temp 99.9°F | Ht 66.0 in | Wt 201.0 lb

## 2013-02-01 DIAGNOSIS — L0390 Cellulitis, unspecified: Secondary | ICD-10-CM

## 2013-02-01 DIAGNOSIS — L0291 Cutaneous abscess, unspecified: Secondary | ICD-10-CM

## 2013-02-01 LAB — POCT GLYCOSYLATED HEMOGLOBIN (HGB A1C): Hemoglobin A1C: 5.6

## 2013-02-01 MED ORDER — DOXYCYCLINE HYCLATE 100 MG PO CAPS: 100.0000 mg | ORAL_CAPSULE | Freq: Two times a day (BID) | ORAL | Status: DC

## 2013-02-01 NOTE — Patient Instructions (Signed)
Cellulitis Cellulitis is an infection of the skin and the tissue beneath it. The infected area is usually red and tender. Cellulitis occurs most often in the arms and lower legs.  CAUSES  Cellulitis is caused by bacteria that enter the skin through cracks or cuts in the skin. The most common types of bacteria that cause cellulitis are Staphylococcus and Streptococcus. SYMPTOMS   Redness and warmth.  Swelling.  Tenderness or pain.  Fever. DIAGNOSIS  Your caregiver can usually determine what is wrong based on a physical exam. Blood tests may also be done. TREATMENT  Treatment usually involves taking an antibiotic medicine. HOME CARE INSTRUCTIONS   Take your antibiotics as directed. Finish them even if you start to feel better.  Keep the infected arm or leg elevated to reduce swelling.  Apply a warm cloth to the affected area up to 4 times per day to relieve pain.  Only take over-the-counter or prescription medicines for pain, discomfort, or fever as directed by your caregiver.  Keep all follow-up appointments as directed by your caregiver. SEEK MEDICAL CARE IF:   You notice red streaks coming from the infected area.  Your red area gets larger or turns dark in color.  Your bone or joint underneath the infected area becomes painful after the skin has healed.  Your infection returns in the same area or another area.  You notice a swollen bump in the infected area.  You develop new symptoms. SEEK IMMEDIATE MEDICAL CARE IF:   You have a fever.  You feel very sleepy.  You develop vomiting or diarrhea.  You have a general ill feeling (malaise) with muscle aches and pains. MAKE SURE YOU:   Understand these instructions.  Will watch your condition.  Will get help right away if you are not doing well or get worse. Document Released: 11/05/2004 Document Revised: 07/28/2011 Document Reviewed: 04/13/2011 Exit[employer] Patient Information 2014 [employer].  

## 2013-02-01 NOTE — Progress Notes (Signed)
   Subjective:    Patient ID: Jane Howard, female    DOB: 05/10/1955, 57 y.o.   MRN: [id number]  HPI Pt presents today with chief complaint of cellulitis L lower leg Has hx/o cellulitis in this leg in the past.  Non diabetic.  No fever or chills.  Noticed erythema and mild tenderness yesterday.  L ankle mildly swollen.  Baseline hx/o gout. Ankle pain not consistent with previous got flares.     Review of Systems  All other systems reviewed and are negative.       Objective:   Physical Exam  Constitutional: She appears well-developed and well-nourished.  HENT:  Head: Normocephalic and atraumatic.  Eyes: Conjunctivae are normal. Pupils are equal, round, and reactive to light.  Neck: Normal range of motion. Neck supple.  Cardiovascular: Normal rate and regular rhythm.   Pulmonary/Chest: Effort normal and breath sounds normal.  Abdominal: Soft.  Skin:  Mild erythema on medial aspect of L ankle. Mild TTP.            Assessment & Plan:  Cellulitis - Plan: POCT A1C, Comprehensive metabolic panel, CBC, doxycycline (VIBRAMYCIN) 100 MG capsule  Will place on doxy for soft tissue coverage  Will also check baseline metabolic labs including a1c to rule out underlying derangement.  Ddx also includes atypical gout flare.  COnsider course of prednisone if pain worsens despite treatment vs. ER evaluation over the holidays.  Discussed infectious and derm red flags.  Follow up as needed.

## 2013-02-02 LAB — CBC
Hemoglobin: 14.8 g/dL (ref 11.1–15.9)
MCH: 29.6 pg (ref 26.6–33.0)
MCHC: 34.4 g/dL (ref 31.5–35.7)
Platelets: 283 10*3/uL (ref 150–379)
RDW: 12.9 % (ref 12.3–15.4)

## 2013-02-02 LAB — COMPREHENSIVE METABOLIC PANEL
ALT: 33 IU/L — ABNORMAL HIGH (ref 0–32)
AST: 23 IU/L (ref 0–40)
Albumin/Globulin Ratio: 1.6 (ref 1.1–2.5)
Alkaline Phosphatase: 95 IU/L (ref 39–117)
BUN/Creatinine Ratio: 21 (ref 9–23)
Calcium: 10 mg/dL (ref 8.7–10.2)
GFR calc non Af Amer: 93 mL/min/{1.73_m2} (ref >59)
Globulin, Total: 2.7 g/dL (ref 1.5–4.5)
Potassium: 4.5 mmol/L (ref 3.5–5.2)
Sodium: 142 mmol/L (ref 134–144)
Total Bilirubin: <0.2 mg/dL (ref 0.0–1.2)

## 2013-02-03 ENCOUNTER — Ambulatory Visit
Admission: RE | Admit: 2013-02-03 | Discharge: 2013-02-03 | Disposition: A | Discharge status: Home / Self Care | Payer: 59 | Source: Ambulatory Visit | Attending: Obstetrics and Gynecology | Admitting: Obstetrics and Gynecology

## 2013-02-03 DIAGNOSIS — R928 Other abnormal and inconclusive findings on diagnostic imaging of breast: Secondary | ICD-10-CM

## 2013-07-24 ENCOUNTER — Other Ambulatory Visit: Payer: Self-pay | Admitting: Family Medicine

## 2013-07-27 ENCOUNTER — Other Ambulatory Visit: Payer: Self-pay | Admitting: Family Medicine

## 2013-08-17 ENCOUNTER — Telehealth: Payer: Self-pay | Admitting: Family Medicine

## 2013-08-22 NOTE — Telephone Encounter (Signed)
Spoke with patient and informed her that Dr. Christell ConstantMoore is not taking any new patients but that we would be glad to get her in to se Dr. Hyacinth MeekerMiller or one of the other providers here at the office. Patient stated she would talk to her urologist and give us a call back

## 2013-09-21 ENCOUNTER — Telehealth: Payer: Self-pay | Admitting: Family Medicine

## 2013-09-21 NOTE — Telephone Encounter (Signed)
Patient has decided to go to urgent care tonight since we do not have any available appts 1st thing in the am

## 2013-11-17 ENCOUNTER — Ambulatory Visit (INDEPENDENT_AMBULATORY_CARE_PROVIDER_SITE_OTHER): Payer: 59 | Admitting: Nurse Practitioner

## 2013-11-17 ENCOUNTER — Encounter: Payer: Self-pay | Admitting: Nurse Practitioner

## 2013-11-17 VITALS — BP 159/87 | HR 84 | Temp 98.5°F | Ht 66.0 in | Wt 202.4 lb

## 2013-11-17 DIAGNOSIS — N3000 Acute cystitis without hematuria: Secondary | ICD-10-CM

## 2013-11-17 DIAGNOSIS — M109 Gout, unspecified: Secondary | ICD-10-CM | POA: Insufficient documentation

## 2013-11-17 DIAGNOSIS — R300 Dysuria: Secondary | ICD-10-CM

## 2013-11-17 DIAGNOSIS — Z6832 Body mass index (BMI) 32.0-32.9, adult: Secondary | ICD-10-CM

## 2013-11-17 DIAGNOSIS — F411 Generalized anxiety disorder: Secondary | ICD-10-CM

## 2013-11-17 DIAGNOSIS — I10 Essential (primary) hypertension: Secondary | ICD-10-CM

## 2013-11-17 DIAGNOSIS — M1A9XX0 Chronic gout, unspecified, without tophus (tophi): Secondary | ICD-10-CM

## 2013-11-17 LAB — POCT UA - MICROSCOPIC ONLY
Casts, Ur, LPF, POC: NEGATIVE
Crystals, Ur, HPF, POC: NEGATIVE
Mucus, UA: NEGATIVE
Yeast, UA: NEGATIVE

## 2013-11-17 LAB — POCT URINALYSIS DIPSTICK
BILIRUBIN UA: NEGATIVE
GLUCOSE UA: NEGATIVE
Ketones, UA: NEGATIVE
NITRITE UA: POSITIVE
SPEC GRAV UA: 1.015
UROBILINOGEN UA: NEGATIVE
pH, UA: 6.5

## 2013-11-17 MED ORDER — LISINOPRIL 20 MG PO TABS: 20.0000 mg | ORAL_TABLET | Freq: Every day | ORAL | Status: DC

## 2013-11-17 MED ORDER — ALLOPURINOL 100 MG PO TABS: 100.0000 mg | ORAL_TABLET | Freq: Every day | ORAL | Status: DC

## 2013-11-17 MED ORDER — CLONAZEPAM 0.5 MG PO TABS: 0.5000 mg | ORAL_TABLET | Freq: Two times a day (BID) | ORAL | Status: DC | PRN

## 2013-11-17 MED ORDER — SULFAMETHOXAZOLE-TMP DS 800-160 MG PO TABS: 1.0000 | ORAL_TABLET | Freq: Two times a day (BID) | ORAL | Status: DC

## 2013-11-17 NOTE — Patient Instructions (Signed)

## 2013-11-17 NOTE — Progress Notes (Signed)
Subjective:    Patient ID: Jane Howard, female    DOB: 12/04/1955, 58 y.o.   MRN: [id number]  Patient here today for follow up of chronic medical problems.   Hypertension This is a chronic problem. The problem is uncontrolled. Pertinent negatives include no anxiety, chest pain, headaches, palpitations, peripheral edema or shortness of breath. There are no associated agents to hypertension. Risk factors for coronary artery disease include dyslipidemia, obesity and post-menopausal state. Past treatments include diuretics. The current treatment provides moderate improvement. Compliance problems include diet and exercise.   GOUT No recent flareups- takes allopurinol on regular basis. GAD Has been having trouble with anxiety since her dad diae last year- gets anxious and cannot sleep- Klonopin works well to help her sleep.  * C/O slight dysuria and frequency   Review of Systems  Respiratory: Negative for shortness of breath.   Cardiovascular: Negative for chest pain and palpitations.  Genitourinary: Positive for dysuria, urgency and frequency.  Neurological: Negative for headaches.       Objective:   Physical Exam  Constitutional: She is oriented to person, place, and time. She appears well-developed and well-nourished.  HENT:  Nose: Nose normal.  Mouth/Throat: Oropharynx is clear and moist.  Eyes: EOM are normal.  Neck: Trachea normal, normal range of motion and full passive range of motion without pain. Neck supple. No JVD present. Carotid bruit is not present. No thyromegaly present.  Cardiovascular: Normal rate, regular rhythm, normal heart sounds and intact distal pulses.  Exam reveals no gallop and no friction rub.   No murmur heard. Pulmonary/Chest: Effort normal and breath sounds normal.  Abdominal: Soft. Bowel sounds are normal. She exhibits no distension and no mass. There is no tenderness.  Musculoskeletal: Normal range of motion.  Lymphadenopathy:    She has no  cervical adenopathy.  Neurological: She is alert and oriented to person, place, and time. She has normal reflexes.  Skin: Skin is warm and dry.  Psychiatric: She has a normal mood and affect. Her behavior is normal. Judgment and thought content normal.   BP 159/87  Pulse 84  Temp(Src) 98.5 F (36.9 C) (Oral)  Ht _0  (1.676 m)  Wt 202 lb 6.4 oz (91.808 kg)  BMI 32.68 kg/m2        Assessment & Plan:  1. Chronic gout without tophus, unspecified cause, unspecified site Low purine diet - allopurinol (ZYLOPRIM) 100 MG tablet; Take 1 tablet (100 mg total) by mouth daily.  Dispense: 90 tablet; Refill: 1  2. Essential hypertension, benign Low NA+ diet - lisinopril (PRINIVIL,ZESTRIL) 20 MG tablet; Take 1 tablet (20 mg total) by mouth daily.  Dispense: 90 tablet; Refill: 1 - CMP14+EGFR - NMR, lipoprofile  3. BMI 32.0-32.9,adult Discussed diet and exercise for person with BMI >25 Will recheck weight in 3-6 months  4. GAD (generalized anxiety disorder)  5. Anxiety state Stress management - clonazePAM (KLONOPIN) 0.5 MG tablet; Take 1 tablet (0.5 mg total) by mouth 2 (two) times daily as needed for anxiety.  Dispense: 30 tablet; Refill: 1  6. Dysuria - POCT urinalysis dipstick - POCT UA - Microscopic Only  7. Acute cystitis without hematuria Force fluids- cranberry juice - Urine culture - sulfamethoxazole-trimethoprim (BACTRIM DS) 800-160 MG per tablet; Take 1 tablet by mouth 2 (two) times daily.  Dispense: 20 tablet; Refill: 0   Labs pending Health maintenance reviewed Diet and exercise encouraged Continue all meds Follow up  In 3 month   Dora, FNP

## 2013-11-18 LAB — NMR, LIPOPROFILE
CHOLESTEROL: 221 mg/dL — AB (ref 100–199)
HDL Cholesterol by NMR: 41 mg/dL (ref >39)
HDL Particle Number: 32.9 umol/L (ref >=30.5)
LDL PARTICLE NUMBER: 1854 nmol/L — AB (ref <1000)
LDL SIZE: 20.6 nm (ref >20.5)
LDLC SERPL CALC-MCNC: 109 mg/dL — AB (ref 0–99)
LP-IR SCORE: 77 — AB (ref <=45)
Small LDL Particle Number: 834 nmol/L — ABNORMAL HIGH (ref <=527)
TRIGLYCERIDES BY NMR: 354 mg/dL — AB (ref 0–149)

## 2013-11-18 LAB — CMP14+EGFR
A/G RATIO: 1.6 (ref 1.1–2.5)
ALK PHOS: 96 IU/L (ref 39–117)
ALT: 28 IU/L (ref 0–32)
AST: 21 IU/L (ref 0–40)
Albumin: 4.2 g/dL (ref 3.5–5.5)
BUN / CREAT RATIO: 23 (ref 9–23)
BUN: 18 mg/dL (ref 6–24)
CALCIUM: 9 mg/dL (ref 8.7–10.2)
CO2: 25 mmol/L (ref 18–29)
CREATININE: 0.77 mg/dL (ref 0.57–1.00)
Chloride: 101 mmol/L (ref 97–108)
GFR, EST AFRICAN AMERICAN: 99 mL/min/{1.73_m2} (ref >59)
GFR, EST NON AFRICAN AMERICAN: 86 mL/min/{1.73_m2} (ref >59)
GLOBULIN, TOTAL: 2.6 g/dL (ref 1.5–4.5)
Glucose: 105 mg/dL — ABNORMAL HIGH (ref 65–99)
Potassium: 4.1 mmol/L (ref 3.5–5.2)
SODIUM: 141 mmol/L (ref 134–144)
Total Bilirubin: <0.2 mg/dL (ref 0.0–1.2)
Total Protein: 6.8 g/dL (ref 6.0–8.5)

## 2013-11-19 LAB — URINE CULTURE

## 2013-11-24 ENCOUNTER — Telehealth: Payer: Self-pay | Admitting: Nurse Practitioner

## 2013-11-24 NOTE — Telephone Encounter (Signed)
Results are not available at this time. Thyroid was added to labs yesterday evening. Explained that it will be Monday at the earliest before we have the results. Patient will f/u Monday evening if we haven't called.

## 2013-11-25 LAB — THYROID PANEL WITH TSH
Free Thyroxine Index: 2.5 (ref 1.2–4.9)
T3 Uptake Ratio: 29 % (ref 24–39)
T4 TOTAL: 8.5 ug/dL (ref 4.5–12.0)
TSH: 1.28 u[IU]/mL (ref 0.450–4.500)

## 2013-11-25 LAB — SPECIMEN STATUS REPORT

## 2013-11-27 ENCOUNTER — Telehealth: Payer: Self-pay | Admitting: *Deleted

## 2013-11-27 NOTE — Telephone Encounter (Signed)
Message copied by Almeta MonasSTONE, JANIE M on Mon Nov 27, 2013  4:47 PM ------      Message from: Bennie PieriniMARTIN, MARY-MARGARET      Created: Sun Nov 26, 2013  2:39 PM       Thyroid panel in normal range ------

## 2013-11-27 NOTE — Telephone Encounter (Signed)
Aware,wife's thyroid level is normal.

## 2013-11-29 ENCOUNTER — Telehealth: Payer: Self-pay | Admitting: Nurse Practitioner

## 2014-01-12 LAB — HM PAP SMEAR

## 2014-01-16 ENCOUNTER — Other Ambulatory Visit: Payer: Self-pay | Admitting: Obstetrics and Gynecology

## 2014-01-16 DIAGNOSIS — N951 Menopausal and female climacteric states: Secondary | ICD-10-CM

## 2014-01-25 ENCOUNTER — Other Ambulatory Visit: Payer: Self-pay

## 2014-01-25 DIAGNOSIS — F411 Generalized anxiety disorder: Secondary | ICD-10-CM

## 2014-01-25 MED ORDER — CLONAZEPAM 0.5 MG PO TABS: 0.5000 mg | ORAL_TABLET | Freq: Two times a day (BID) | ORAL | Status: DC | PRN

## 2014-01-25 NOTE — Telephone Encounter (Signed)
Last seen 11/17/13 MMM This is for mail order  IF approved print and route to nurse

## 2014-01-25 NOTE — Telephone Encounter (Signed)
Please call in xanax with 0 refills 

## 2014-02-08 ENCOUNTER — Other Ambulatory Visit: Payer: Self-pay | Admitting: Obstetrics and Gynecology

## 2014-02-08 DIAGNOSIS — Z1231 Encounter for screening mammogram for malignant neoplasm of breast: Secondary | ICD-10-CM

## 2014-02-13 ENCOUNTER — Other Ambulatory Visit: Payer: 59

## 2014-02-20 ENCOUNTER — Ambulatory Visit: Payer: 59

## 2014-02-21 ENCOUNTER — Ambulatory Visit
Admission: RE | Admit: 2014-02-21 | Discharge: 2014-02-21 | Disposition: A | Discharge status: Home / Self Care | Payer: 59 | Source: Ambulatory Visit | Attending: Obstetrics and Gynecology | Admitting: Obstetrics and Gynecology

## 2014-02-21 ENCOUNTER — Ambulatory Visit
Admission: RE | Admit: 2014-02-21 | Discharge: 2014-02-21 | Disposition: A | Discharge status: Home / Self Care | Payer: Self-pay | Source: Ambulatory Visit | Attending: Obstetrics and Gynecology | Admitting: Obstetrics and Gynecology

## 2014-02-21 DIAGNOSIS — N951 Menopausal and female climacteric states: Secondary | ICD-10-CM

## 2014-02-21 DIAGNOSIS — Z1231 Encounter for screening mammogram for malignant neoplasm of breast: Secondary | ICD-10-CM

## 2014-04-11 NOTE — Telephone Encounter (Signed)
Pt aware.

## 2014-04-13 ENCOUNTER — Telehealth: Payer: Self-pay | Admitting: Nurse Practitioner

## 2014-04-13 NOTE — Telephone Encounter (Signed)
Please advise 

## 2014-04-15 NOTE — Telephone Encounter (Signed)
Dr. Lupton 

## 2014-04-16 NOTE — Telephone Encounter (Signed)
Patient aware.

## 2014-04-25 ENCOUNTER — Other Ambulatory Visit: Payer: Self-pay | Admitting: Nurse Practitioner

## 2014-08-22 ENCOUNTER — Other Ambulatory Visit: Payer: Self-pay | Admitting: Nurse Practitioner

## 2014-08-22 NOTE — Telephone Encounter (Signed)
no more refills without being seen  

## 2014-08-22 NOTE — Telephone Encounter (Signed)
Pt notified [employer] before further refills

## 2014-09-17 ENCOUNTER — Encounter: Payer: Self-pay | Admitting: Family

## 2014-09-17 ENCOUNTER — Ambulatory Visit (INDEPENDENT_AMBULATORY_CARE_PROVIDER_SITE_OTHER): Payer: Commercial Managed Care - HMO | Admitting: Family

## 2014-09-17 VITALS — BP 113/70 | HR 82 | Temp 97.6°F | Ht 66.0 in | Wt 198.4 lb

## 2014-09-17 DIAGNOSIS — M1A9XX0 Chronic gout, unspecified, without tophus (tophi): Secondary | ICD-10-CM | POA: Diagnosis not present

## 2014-09-17 DIAGNOSIS — J069 Acute upper respiratory infection, unspecified: Secondary | ICD-10-CM

## 2014-09-17 DIAGNOSIS — F411 Generalized anxiety disorder: Secondary | ICD-10-CM | POA: Diagnosis not present

## 2014-09-17 DIAGNOSIS — I10 Essential (primary) hypertension: Secondary | ICD-10-CM

## 2014-09-17 MED ORDER — LISINOPRIL 20 MG PO TABS: 20.0000 mg | ORAL_TABLET | Freq: Every day | ORAL | Status: DC

## 2014-09-17 MED ORDER — HYDROCODONE-HOMATROPINE 5-1.5 MG/5ML PO SYRP: 5.0000 mL | ORAL_SOLUTION | Freq: Three times a day (TID) | ORAL | Status: DC | PRN

## 2014-09-17 MED ORDER — CLONAZEPAM 0.5 MG PO TABS: 0.5000 mg | ORAL_TABLET | Freq: Two times a day (BID) | ORAL | Status: DC | PRN

## 2014-09-17 MED ORDER — BENZONATATE 200 MG PO CAPS: 200.0000 mg | ORAL_CAPSULE | Freq: Three times a day (TID) | ORAL | Status: DC | PRN

## 2014-09-17 NOTE — Patient Instructions (Signed)

## 2014-09-17 NOTE — Progress Notes (Signed)
Subjective:    Patient ID: Jane Howard, female    DOB: 1955-05-18, 59 y.o.   MRN: [id number]  Pt presents to the office today for chronic follow up. Pt is currently taking allopurinol for kidney stones. Pt states she is followed by her Urologists yearly. Hypertension This is a chronic problem. The current episode started more than 1 year ago. The problem has been resolved since onset. The problem is controlled. Associated symptoms include anxiety and peripheral edema ("at times"). Pertinent negatives include no chest pain, headaches, malaise/fatigue, palpitations or shortness of breath. Risk factors for coronary artery disease include family history, obesity, post-menopausal state and sedentary lifestyle. Past treatments include ACE inhibitors. The current treatment provides significant improvement. There is no history of kidney disease, CAD/MI, CVA, heart failure or a thyroid problem. There is no history of sleep apnea.  Anxiety Presents for follow-up visit. Onset was 1 to 6 months ago. The problem has been waxing and waning. Patient reports no chest pain, depressed mood, excessive worry, insomnia, irritability, nervous/anxious behavior, palpitations or shortness of breath. Symptoms occur occasionally. The quality of sleep is good.   Her past medical history is significant for anxiety/panic attacks. Past treatments include benzodiazephines.  Cough This is a new problem. The current episode started 1 to 4 weeks ago. The problem has been gradually improving. The problem occurs every few hours. The cough is non-productive. Pertinent negatives include no chest pain, ear pain, headaches, postnasal drip or shortness of breath. The symptoms are aggravated by lying down. She has tried rest ([facility] was [facility] and given antibiotic and cough surup) for the symptoms. The treatment provided moderate relief.      Review of Systems  Constitutional: Negative.  Negative for malaise/fatigue  and irritability.  HENT: Negative.  Negative for ear pain and postnasal drip.   Eyes: Negative.   Respiratory: Positive for cough. Negative for shortness of breath.   Cardiovascular: Negative.  Negative for chest pain and palpitations.  Gastrointestinal: Negative.   Endocrine: Negative.   Genitourinary: Negative.   Musculoskeletal: Negative.   Neurological: Negative.  Negative for headaches.  Hematological: Negative.   Psychiatric/Behavioral: Negative.  The patient is not nervous/anxious and does not have insomnia.   All other systems reviewed and are negative.      Objective:   Physical Exam  Constitutional: She is oriented to person, place, and time. She appears well-developed and well-nourished. No distress.  HENT:  Head: Normocephalic and atraumatic.  Right Ear: External ear normal.  Left Ear: External ear normal.  Nose: Nose normal.  Mouth/Throat: Oropharynx is clear and moist.  Eyes: Pupils are equal, round, and reactive to light.  Neck: Normal range of motion. Neck supple. No thyromegaly present.  Cardiovascular: Normal rate, regular rhythm, normal heart sounds and intact distal pulses.   No murmur heard. Pulmonary/Chest: Effort normal and breath sounds normal. No respiratory distress. She has no wheezes.  Abdominal: Soft. Bowel sounds are normal. She exhibits no distension. There is no tenderness.  Musculoskeletal: Normal range of motion. She exhibits edema (trace amt in left foot). She exhibits no tenderness.  Neurological: She is alert and oriented to person, place, and time. She has normal reflexes. No cranial nerve deficit.  Skin: Skin is warm and dry.  Psychiatric: She has a normal mood and affect. Her behavior is normal. Judgment and thought content normal.  Vitals reviewed.   BP 113/70 mmHg  Pulse 82  Temp(Src) 97.6 F (36.4 C) (Oral)  Ht _0  (1.676 m)  Wt 198 lb 6.4 oz (89.994 kg)  BMI 32.04 kg/m2       Assessment & Plan:  1. GAD (generalized  anxiety disorder) - CMP14+EGFR - clonazePAM (KLONOPIN) 0.5 MG tablet; Take 1 tablet (0.5 mg total) by mouth 2 (two) times daily as needed for anxiety.  Dispense: 180 tablet; Refill: 0  2. Chronic gout without tophus, unspecified cause, unspecified site - CMP14+EGFR  3. Essential hypertension, benign  - CMP14+EGFR - lisinopril (PRINIVIL,ZESTRIL) 20 MG tablet; Take 1 tablet (20 mg total) by mouth daily.  Dispense: 90 tablet; Refill: 3  4. Anxiety state  5. Acute upper respiratory infection -- Take meds as prescribed - Use a cool mist humidifier  -Use saline nose sprays frequently -Saline irrigations of the nose can be very helpful if done frequently.  * 4X daily for 1 week*  * Use of a nettie pot can be helpful with this. Follow directions with this* -Force fluids -For any cough or congestion  Use plain Mucinex- regular strength or max strength is fine   * Children- consult with Pharmacist for dosing -For fever or aces or pains- take tylenol or ibuprofen appropriate for age and weight.  * for fevers greater than 101 orally you may alternate ibuprofen and tylenol every  3 hours. -Throat lozenges if help - HYDROcodone-homatropine (HYCODAN) 5-1.5 MG/5ML syrup; Take 5 mLs by mouth every 8 (eight) hours as needed for cough.  Dispense: 120 mL; Refill: 0 - benzonatate (TESSALON) 200 MG capsule; Take 1 capsule (200 mg total) by mouth 3 (three) times daily as needed.  Dispense: 30 capsule; Refill: 1   Continue all meds Labs pending Health Maintenance reviewed Diet and exercise encouraged RTO 1 year  Evelina Dun, FNP

## 2014-09-18 LAB — CMP14+EGFR
ALK PHOS: 84 IU/L (ref 39–117)
ALT: 27 IU/L (ref 0–32)
AST: 17 IU/L (ref 0–40)
Albumin/Globulin Ratio: 1.7 (ref 1.1–2.5)
Albumin: 3.9 g/dL (ref 3.5–5.5)
BUN/Creatinine Ratio: 23 (ref 9–23)
BUN: 18 mg/dL (ref 6–24)
CO2: 26 mmol/L (ref 18–29)
Calcium: 9.3 mg/dL (ref 8.7–10.2)
Chloride: 101 mmol/L (ref 97–108)
Creatinine, Ser: 0.77 mg/dL (ref 0.57–1.00)
GFR calc Af Amer: 98 mL/min/{1.73_m2} (ref >59)
GFR calc non Af Amer: 85 mL/min/{1.73_m2} (ref >59)
Globulin, Total: 2.3 g/dL (ref 1.5–4.5)
Glucose: 149 mg/dL — ABNORMAL HIGH (ref 65–99)
POTASSIUM: 4.4 mmol/L (ref 3.5–5.2)
Sodium: 142 mmol/L (ref 134–144)
Total Protein: 6.2 g/dL (ref 6.0–8.5)

## 2014-11-07 ENCOUNTER — Telehealth: Payer: Self-pay | Admitting: Family

## 2014-11-07 NOTE — Telephone Encounter (Signed)
Called and clarified

## 2014-11-16 ENCOUNTER — Encounter: Payer: Self-pay | Admitting: Family Medicine

## 2014-11-16 ENCOUNTER — Telehealth: Payer: Self-pay | Admitting: Nurse Practitioner

## 2014-11-16 ENCOUNTER — Ambulatory Visit (INDEPENDENT_AMBULATORY_CARE_PROVIDER_SITE_OTHER): Payer: Commercial Managed Care - HMO | Admitting: Family Medicine

## 2014-11-16 VITALS — BP 134/82 | HR 90 | Temp 98.8°F | Ht 66.0 in | Wt 191.0 lb

## 2014-11-16 DIAGNOSIS — L03116 Cellulitis of left lower limb: Secondary | ICD-10-CM | POA: Diagnosis not present

## 2014-11-16 MED ORDER — DOXYCYCLINE HYCLATE 100 MG PO TABS: 100.0000 mg | ORAL_TABLET | Freq: Two times a day (BID) | ORAL | Status: DC

## 2014-11-16 NOTE — Progress Notes (Signed)
   HPI  Patient presents today with concerns for cellulitis.  She explains that she developed left lower extremity tingling last night. Findings this morning she developed a small circular area with a few red bumps which has slowly worsened and continued throughout the day. She states that it is red, hot, and has a burning type pain.  She has had cellulitis at this site previously.  PMH: Smoking status noted ROS: Per HPI  Objective: BP 134/82 mmHg  Pulse 90  Temp(Src) 98.8 F (37.1 C) (Oral)  Ht  (1.676 m)  Wt 191 lb (86.637 kg)  BMI 30.84 kg/m2 Gen: NAD, alert, cooperative with exam HEENT: NCAT CV: RRR, good S1/S2, no murmur Resp: CTABL, no wheezes, non-labored Ext: Lower extremity with slight swelling, an area measuring 14 cm x 1.5 cm of warmth and erythematous papules on the background, no areas of fluctuance or induration Neuro: Alert and oriented, No gross deficits  Assessment and plan:  # Cellulitis Treatment doxycycline Consider recurrent shingles outbreak, however he does not strictly dermatomal distribution If these become more frequent would consider a trial of Valtrex suppression Red flags for return were provided Estimated illness  Meds ordered this encounter  Medications  . doxycycline (VIBRA-TABS) 100 MG tablet    Sig: Take 1 tablet (100 mg total) by mouth 2 (two) times daily. 1 po bid    Dispense:  20 tablet    Refill:  0    Murtis Sink, MD [facility] 11/16/2014, 6:57 PM

## 2014-11-16 NOTE — Telephone Encounter (Signed)
Appt made for evening clinic tonight so that the correct abx is chosen after being looked at to treat this.

## 2014-11-16 NOTE — Patient Instructions (Signed)
Great to meet you!  I have prescribed doxycycline for your infection.   Cellulitis Cellulitis is an infection of the skin and the tissue beneath it. The infected area is usually red and tender. Cellulitis occurs most often in the arms and lower legs.  CAUSES  Cellulitis is caused by bacteria that enter the skin through cracks or cuts in the skin. The most common types of bacteria that cause cellulitis are staphylococci and streptococci. SIGNS AND SYMPTOMS   Redness and warmth.  Swelling.  Tenderness or pain.  Fever. DIAGNOSIS  Your health care provider can usually determine what is wrong based on a physical exam. Blood tests may also be done. TREATMENT  Treatment usually involves taking an antibiotic medicine. HOME CARE INSTRUCTIONS   Take your antibiotic medicine as directed by your health care provider. Finish the antibiotic even if you start to feel better.  Keep the infected arm or leg elevated to reduce swelling.  Apply a warm cloth to the affected area up to 4 times per day to relieve pain.  Take medicines only as directed by your health care provider.  Keep all follow-up visits as directed by your health care provider. SEEK MEDICAL CARE IF:   You notice red streaks coming from the infected area.  Your red area gets larger or turns dark in color.  Your bone or joint underneath the infected area becomes painful after the skin has healed.  Your infection returns in the same area or another area.  You notice a swollen bump in the infected area.  You develop new symptoms.  You have a fever. SEEK IMMEDIATE MEDICAL CARE IF:   You feel very sleepy.  You develop vomiting or diarrhea.  You have a general ill feeling (malaise) with muscle aches and pains.   This information is not intended to replace advice given to you by your health care provider. Make sure you discuss any questions you have with your health care provider.   Document Released: 11/05/2004  Document Revised: 10/17/2014 Document Reviewed: 04/13/2011 Elsevier Interactive Patient Education [employer]

## 2014-11-29 ENCOUNTER — Ambulatory Visit (INDEPENDENT_AMBULATORY_CARE_PROVIDER_SITE_OTHER): Payer: Commercial Managed Care - HMO | Admitting: Nurse Practitioner

## 2014-11-29 ENCOUNTER — Encounter: Payer: Self-pay | Admitting: Nurse Practitioner

## 2014-11-29 ENCOUNTER — Telehealth: Payer: Self-pay | Admitting: Family Medicine

## 2014-11-29 VITALS — BP 128/77 | HR 80 | Temp 98.1°F | Ht 66.0 in | Wt 189.0 lb

## 2014-11-29 DIAGNOSIS — R609 Edema, unspecified: Secondary | ICD-10-CM

## 2014-11-29 DIAGNOSIS — M79605 Pain in left leg: Secondary | ICD-10-CM | POA: Diagnosis not present

## 2014-11-29 MED ORDER — CIPROFLOXACIN HCL 500 MG PO TABS: 500.0000 mg | ORAL_TABLET | Freq: Two times a day (BID) | ORAL | Status: DC

## 2014-11-29 NOTE — Telephone Encounter (Signed)
Patient states her leg is some better but still slightly red and swollen. She wants to know if she should take another round of antibiotics? Please advise

## 2014-11-29 NOTE — Progress Notes (Signed)
   Subjective:    Patient ID: Jane BroomsJeanette D Mccants, female    DOB: 09-06-1955, 59 y.o.   MRN: [id number]  HPI Patient called 11/16/14 say ing that she had cellulitis in left leg and could not come in for appointment. She was told she had to be seen- Came in and saw Dr. Ermalinda MemosBradshaw and was given doxycycline. Today she says that she has a burning sensation in the her calf and radiates up to groin area. Some pain with walking.    Review of Systems  Constitutional: Negative.   HENT: Negative.   Respiratory: Negative.   Cardiovascular: Negative.   Genitourinary: Negative.   Neurological: Negative.   Psychiatric/Behavioral: Negative.   All other systems reviewed and are negative.      Objective:   Physical Exam  Constitutional: She is oriented to person, place, and time. She appears well-developed and well-nourished.  Cardiovascular: Normal rate, regular rhythm and normal heart sounds.   Pulmonary/Chest: Effort normal and breath sounds normal.  Musculoskeletal: She exhibits edema (Mild edema-  non pitting- left lower leg- slight erythema but cool to touch.).  - homan sign  Neurological: She is alert and oriented to person, place, and time.  Skin: Skin is warm.  Psychiatric: She has a normal mood and affect. Her behavior is normal. Judgment and thought content normal.    BP 128/77 mmHg  Pulse 80  Temp(Src) 98.1 F (36.7 C) (Oral)  Ht 5\' 6"  (1.676 m)  Wt 189 lb (85.73 kg)  BMI 30.52 kg/m2       Assessment & Plan:   1. Left leg pain   2. Peripheral edema    Orders Placed This Encounter  Procedures  . US Venous Img Lower Unilateral Left    Standing Status: Future     Number of Occurrences:      Standing Expiration Date: 01/29/2016    Order Specific Question:  Reason for Exam (SYMPTOM  OR DIAGNOSIS REQUIRED)    Answer:  left leg edema    Order Specific Question:  Preferred imaging location?    Answer:  [facility]   Meds ordered this encounter  Medications  .  ciprofloxacin (CIPRO) 500 MG tablet    Sig: Take 1 tablet (500 mg total) by mouth 2 (two) times daily.    Dispense:  20 tablet    Refill:  0    Order Specific Question:  Supervising Provider    Answer:  Ernestina PennaMOORE, DONALD W [[id number]]    Rest Elevate leg Will call with doppler results Baby ASA as rx RTO prn  Mary-Margaret Daphine DeutscherMartin, FNP

## 2014-11-29 NOTE — Telephone Encounter (Signed)
If it is still tender and red she should likely be seen again, it could be that she needs more antibiotics but also could be that something different is going on.   Murtis SinkSam Bradshaw, MD [facility] 11/29/2014, 11:43 AM

## 2014-11-30 ENCOUNTER — Other Ambulatory Visit: Payer: Self-pay | Admitting: Nurse Practitioner

## 2014-11-30 ENCOUNTER — Ambulatory Visit (HOSPITAL_COMMUNITY)
Admission: RE | Admit: 2014-11-30 | Discharge: 2014-11-30 | Disposition: A | Discharge status: Home / Self Care | Payer: Commercial Managed Care - HMO | Source: Ambulatory Visit | Attending: Nurse Practitioner | Admitting: Nurse Practitioner

## 2014-11-30 ENCOUNTER — Telehealth: Payer: Self-pay | Admitting: Nurse Practitioner

## 2014-11-30 DIAGNOSIS — L03116 Cellulitis of left lower limb: Secondary | ICD-10-CM | POA: Diagnosis not present

## 2014-11-30 DIAGNOSIS — R609 Edema, unspecified: Secondary | ICD-10-CM

## 2014-11-30 DIAGNOSIS — R600 Localized edema: Secondary | ICD-10-CM | POA: Diagnosis not present

## 2014-11-30 NOTE — Telephone Encounter (Signed)
Pt aware of appointment date and time;  11/30/2014 at 4pm [facility]

## 2014-11-30 NOTE — Progress Notes (Signed)
*  PRELIMINARY RESULTS* Vascular Ultrasound Left lower extremity venous duplex has been completed.  Preliminary findings: No evidence of DVT or baker's cyst.  [facility]   Farrel DemarkJill Eunice, RDMS, RVT  11/30/2014, 4:21 PM

## 2015-03-05 ENCOUNTER — Other Ambulatory Visit: Payer: Self-pay | Admitting: *Deleted

## 2015-03-05 DIAGNOSIS — I10 Essential (primary) hypertension: Secondary | ICD-10-CM

## 2015-03-05 MED ORDER — LISINOPRIL 20 MG PO TABS: 20.0000 mg | ORAL_TABLET | Freq: Every day | ORAL | Status: DC

## 2015-03-28 ENCOUNTER — Ambulatory Visit (INDEPENDENT_AMBULATORY_CARE_PROVIDER_SITE_OTHER): Payer: Managed Care, Other (non HMO) | Admitting: Nurse Practitioner

## 2015-03-28 VITALS — BP 129/80 | HR 77 | Temp 97.0°F | Ht 66.0 in | Wt 186.0 lb

## 2015-03-28 DIAGNOSIS — R9389 Abnormal findings on diagnostic imaging of other specified body structures: Secondary | ICD-10-CM

## 2015-03-28 DIAGNOSIS — J069 Acute upper respiratory infection, unspecified: Secondary | ICD-10-CM

## 2015-03-28 DIAGNOSIS — H109 Unspecified conjunctivitis: Secondary | ICD-10-CM

## 2015-03-28 DIAGNOSIS — R938 Abnormal findings on diagnostic imaging of other specified body structures: Secondary | ICD-10-CM | POA: Diagnosis not present

## 2015-03-28 MED ORDER — TOBRAMYCIN-DEXAMETHASONE 0.3-0.1 % OP SUSP: 2.0000 [drp] | OPHTHALMIC | Status: DC

## 2015-03-28 MED ORDER — AZITHROMYCIN 250 MG PO TABS: ORAL_TABLET | ORAL | Status: DC

## 2015-03-28 NOTE — Patient Instructions (Signed)

## 2015-03-28 NOTE — Progress Notes (Signed)
   Subjective:    Patient ID: Jane Howard, female    DOB: Mar 10, 1955, 60 y.o.   MRN: [id number]  HPI Head congestion with red eyes. Discharge from right eye this morning yellowish in color. Dry cough. Right ear stopped up. Has had cough and congestion for over 3 weeks and has used several OTC meds with no rellief    Review of Systems  Constitutional: Positive for fatigue.  HENT: Positive for congestion, ear pain (right ear), mouth sores, postnasal drip, sinus pressure, sneezing and sore throat.   Eyes: Positive for pain, discharge, redness and itching.  Respiratory: Positive for cough and chest tightness.   Cardiovascular: Negative.   Gastrointestinal: Negative.   Endocrine: Negative.   Genitourinary: Negative.   Musculoskeletal: Negative.   Skin: Negative.   Allergic/Immunologic: Negative.   Neurological: Negative.   Hematological: Negative.   Psychiatric/Behavioral: Negative.   All other systems reviewed and are negative.      Objective:   Physical Exam  Constitutional: She is oriented to person, place, and time. She appears well-developed and well-nourished.  HENT:  Right Ear: Hearing, tympanic membrane, external ear and ear canal normal.  Left Ear: Hearing, tympanic membrane, external ear and ear canal normal.  Nose: Mucosal edema and rhinorrhea present. Right sinus exhibits no maxillary sinus tenderness and no frontal sinus tenderness. Left sinus exhibits no maxillary sinus tenderness and no frontal sinus tenderness.  Mouth/Throat: Uvula is midline, oropharynx is clear and moist and mucous membranes are normal.  Scratch in right ear canal  Eyes: Pupils are equal, round, and reactive to light. Right eye exhibits discharge. Left eye exhibits discharge.  Scleral injection with erythematous conjunctivia  Cardiovascular: Normal rate, regular rhythm and normal heart sounds.   Pulmonary/Chest: Effort normal and breath sounds normal.  Neurological: She is alert and oriented  to person, place, and time.  Skin: Skin is warm and dry.  Psychiatric: She has a normal mood and affect. Her behavior is normal. Judgment and thought content normal.   BP 129/80 mmHg  Pulse 77  Temp(Src) 97 F (36.1 C) (Oral)  Ht  (1.676 m)  Wt 186 lb (84.369 kg)  BMI 30.04 kg/m2        Assessment & Plan:  1. Bilateral conjunctivitis Good hand washing  avoid rubbing eyes No contacts until clears- 5 days - tobramycin-dexamethasone (TOBRADEX) ophthalmic solution; Place 2 drops into both eyes every 4 (four) hours while awake.  Dispense: 5 mL; Refill: 0  2. Acute upper respiratory infection 1. Take meds as prescribed 2. Use a cool mist humidifier especially during the winter months and when heat has been humid. 3. Use saline nose sprays frequently 4. Saline irrigations of the nose can be very helpful if done frequently.  * 4X daily for 1 week*  * Use of a nettie pot can be helpful with this. Follow directions with this* 5. Drink plenty of fluids 6. Keep thermostat turn down low 7.For any cough or congestion  Use plain Mucinex- regular strength or max strength is fine   * Children- consult with Pharmacist for dosing 8. For fever or aces or pains- take tylenol or ibuprofen appropriate for age and weight.  * for fevers greater than 101 orally you may alternate ibuprofen and tylenol every  3 hours.  - azithromycin (ZITHROMAX Z-PAK) 250 MG tablet; As directed  Dispense: 1 each; Refill: 0  Mary-Margaret Daphine Deutscher, FNP

## 2015-04-02 ENCOUNTER — Encounter: Payer: Self-pay | Admitting: *Deleted

## 2015-04-09 ENCOUNTER — Ambulatory Visit
Admission: RE | Admit: 2015-04-09 | Discharge: 2015-04-09 | Disposition: A | Discharge status: Home / Self Care | Payer: 59 | Source: Ambulatory Visit | Attending: Nurse Practitioner | Admitting: Nurse Practitioner

## 2015-04-09 DIAGNOSIS — R9389 Abnormal findings on diagnostic imaging of other specified body structures: Secondary | ICD-10-CM

## 2015-05-14 ENCOUNTER — Other Ambulatory Visit: Payer: Self-pay | Admitting: Nurse Practitioner

## 2015-07-05 ENCOUNTER — Other Ambulatory Visit: Payer: Self-pay | Admitting: Family

## 2015-07-05 MED ORDER — POLYMYXIN B-TRIMETHOPRIM 10000-0.1 UNIT/ML-% OP SOLN: 1.0000 [drp] | Freq: Four times a day (QID) | OPHTHALMIC | Status: DC

## 2015-07-05 NOTE — Telephone Encounter (Signed)
Patient aware.

## 2015-07-05 NOTE — Telephone Encounter (Signed)
Prescription sent to pharmacy.

## 2015-08-20 ENCOUNTER — Other Ambulatory Visit: Payer: Self-pay | Admitting: Family

## 2015-11-06 ENCOUNTER — Ambulatory Visit (INDEPENDENT_AMBULATORY_CARE_PROVIDER_SITE_OTHER): Payer: Managed Care, Other (non HMO) | Admitting: Family

## 2015-11-06 ENCOUNTER — Encounter: Payer: Self-pay | Admitting: Family

## 2015-11-06 VITALS — BP 127/78 | HR 73 | Temp 98.0°F | Ht 66.0 in | Wt 190.0 lb

## 2015-11-06 DIAGNOSIS — E669 Obesity, unspecified: Secondary | ICD-10-CM

## 2015-11-06 DIAGNOSIS — Z13220 Encounter for screening for lipoid disorders: Secondary | ICD-10-CM

## 2015-11-06 DIAGNOSIS — M1A9XX0 Chronic gout, unspecified, without tophus (tophi): Secondary | ICD-10-CM

## 2015-11-06 DIAGNOSIS — Z136 Encounter for screening for cardiovascular disorders: Secondary | ICD-10-CM | POA: Diagnosis not present

## 2015-11-06 DIAGNOSIS — I10 Essential (primary) hypertension: Secondary | ICD-10-CM | POA: Diagnosis not present

## 2015-11-06 DIAGNOSIS — IMO0001 Reserved for inherently not codable concepts without codable children: Secondary | ICD-10-CM

## 2015-11-06 DIAGNOSIS — F411 Generalized anxiety disorder: Secondary | ICD-10-CM

## 2015-11-06 DIAGNOSIS — Z1159 Encounter for screening for other viral diseases: Secondary | ICD-10-CM

## 2015-11-06 DIAGNOSIS — Z23 Encounter for immunization: Secondary | ICD-10-CM | POA: Diagnosis not present

## 2015-11-06 MED ORDER — LISINOPRIL-HYDROCHLOROTHIAZIDE 20-12.5 MG PO TABS: 1.0000 | ORAL_TABLET | Freq: Every day | ORAL | 3 refills | Status: DC

## 2015-11-06 NOTE — Addendum Note (Signed)
Addended by: Almeta MonasSTONE, Omesha Bowerman M on: 11/06/2015 10:36 AM   Modules accepted: Orders

## 2015-11-06 NOTE — Progress Notes (Addendum)
Subjective:    Patient ID: Jane Howard, female    DOB: Dec 16, 1955, 60 y.o.   MRN: [id number]  Pt presents to the office today for chronic follow up. Pt is currently taking allopurinol for kidney stones. Pt states she is followed by her Urologists yearly. Pt states her last visit it was recommended that she start on a small dose fluid pill with her BP med.  Medication Refill   Hypertension  This is a chronic problem. The current episode started more than 1 year ago. The problem has been resolved since onset. The problem is controlled. Associated symptoms include anxiety and peripheral edema ("at times"). Pertinent negatives include no malaise/fatigue or palpitations. Risk factors for coronary artery disease include family history, obesity, post-menopausal state and sedentary lifestyle. Past treatments include ACE inhibitors. The current treatment provides significant improvement. There is no history of kidney disease, CAD/MI, CVA, heart failure or a thyroid problem. There is no history of sleep apnea.  Anxiety  Presents for follow-up visit. Onset was 1 to 6 months ago. The problem has been waxing and waning. Symptoms include nervous/anxious behavior. Patient reports no depressed mood, excessive worry, insomnia, irritability or palpitations. Symptoms occur occasionally. The quality of sleep is good.   Her past medical history is significant for anxiety/panic attacks. Past treatments include benzodiazephines.      Review of Systems  Constitutional: Negative.  Negative for irritability and malaise/fatigue.  HENT: Negative.   Eyes: Negative.   Cardiovascular: Negative.  Negative for palpitations.  Gastrointestinal: Negative.   Endocrine: Negative.   Genitourinary: Negative.   Musculoskeletal: Negative.   Neurological: Negative.   Hematological: Negative.   Psychiatric/Behavioral: The patient is nervous/anxious. The patient does not have insomnia.   All other systems reviewed and are  negative.      Objective:   Physical Exam  Constitutional: She is oriented to person, place, and time. She appears well-developed and well-nourished. No distress.  HENT:  Head: Normocephalic and atraumatic.  Right Ear: External ear normal.  Left Ear: External ear normal.  Nose: Nose normal.  Mouth/Throat: Oropharynx is clear and moist.  Eyes: Pupils are equal, round, and reactive to light.  Neck: Normal range of motion. Neck supple. No thyromegaly present.  Cardiovascular: Normal rate, regular rhythm, normal heart sounds and intact distal pulses.   No murmur heard. Pulmonary/Chest: Effort normal and breath sounds normal. No respiratory distress. She has no wheezes.  Abdominal: Soft. Bowel sounds are normal. She exhibits no distension. There is no tenderness.  Musculoskeletal: Normal range of motion. She exhibits edema (trace amt in left foot). She exhibits no tenderness.  Neurological: She is alert and oriented to person, place, and time. She has normal reflexes. No cranial nerve deficit.  Skin: Skin is warm and dry.  Psychiatric: She has a normal mood and affect. Her behavior is normal. Judgment and thought content normal.  Vitals reviewed.   BP 127/78   Pulse 73   Temp 98 F (36.7 C) (Oral)   Ht '5\' 6"'  (1.676 m)   Wt 190 lb (86.2 kg)   BMI 30.67 kg/m        Assessment & Plan:  1. Essential hypertension, benign -Added HCTZ 12.5 mg today per Urologists  -Pt told to monitor BP at home - CMP14+EGFR - lisinopril-hydrochlorothiazide (ZESTORETIC) 20-12.5 MG tablet; Take 1 tablet by mouth daily.  Dispense: 90 tablet; Refill: 3  2. GAD (generalized anxiety disorder) - CMP14+EGFR  3. Chronic gout without tophus, unspecified cause, unspecified site -  CMP14+EGFR  4. Obesity (BMI 30-39.9) - CMP14+EGFR  5. Encounter for cholesteral screening for cardiovascular disease - CMP14+EGFR - Lipid panel  6. Need for hepatitis C screening test - Hepatitis C antibody  Continue  all meds Labs pending Health Maintenance reviewed Diet and exercise encouraged RTO 6 months  Evelina Dun, FNP

## 2015-11-06 NOTE — Addendum Note (Signed)
Addended by: Jannifer RodneyHAWKS, Loralie Malta A on: 11/06/2015 09:25 AM   Modules accepted: Orders

## 2015-11-06 NOTE — Patient Instructions (Signed)
Health Maintenance, Female Adopting a healthy lifestyle and getting preventive care can go a long way to promote health and wellness. Talk with your health care provider about what schedule of regular examinations is right for you. This is a good chance for you to check in with your provider about disease prevention and staying healthy. In between checkups, there are plenty of things you can do on your own. Experts have done a lot of research about which lifestyle changes and preventive measures are most likely to keep you healthy. Ask your health care provider for more information. WEIGHT AND DIET  Eat a healthy diet  Be sure to include plenty of vegetables, fruits, low-fat dairy products, and lean protein.  Do not eat a lot of foods high in solid fats, added sugars, or salt.  Get regular exercise. This is one of the most important things you can do for your health.  Most adults should exercise for at least 150 minutes each week. The exercise should increase your heart rate and make you sweat (moderate-intensity exercise).  Most adults should also do strengthening exercises at least twice a week. This is in addition to the moderate-intensity exercise.  Maintain a healthy weight  Body mass index (BMI) is a measurement that can be used to identify possible weight problems. It estimates body fat based on height and weight. Your health care provider can help determine your BMI and help you achieve or maintain a healthy weight.  For females 20 years of age and older:   A BMI below 18.5 is considered underweight.  A BMI of 18.5 to 24.9 is normal.  A BMI of 25 to 29.9 is considered overweight.  A BMI of 30 and above is considered obese.  Watch levels of cholesterol and blood lipids  You should start having your blood tested for lipids and cholesterol at 60 years of age, then have this test every 5 years.  You may need to have your cholesterol levels checked more often if:  Your lipid  or cholesterol levels are high.  You are older than 60 years of age.  You are at high risk for heart disease.  CANCER SCREENING   Lung Cancer  Lung cancer screening is recommended for adults 55-80 years old who are at high risk for lung cancer because of a history of smoking.  A yearly low-dose CT scan of the lungs is recommended for people who:  Currently smoke.  Have quit within the past 15 years.  Have at least a 30-pack-year history of smoking. A pack year is smoking an average of one pack of cigarettes a day for 1 year.  Yearly screening should continue until it has been 15 years since you quit.  Yearly screening should stop if you develop a health problem that would prevent you from having lung cancer treatment.  Breast Cancer  Practice breast self-awareness. This means understanding how your breasts normally appear and feel.  It also means doing regular breast self-exams. Let your health care provider know about any changes, no matter how small.  If you are in your 20s or 30s, you should have a clinical breast exam (CBE) by a health care provider every 1-3 years as part of a regular health exam.  If you are 40 or older, have a CBE every year. Also consider having a breast X-ray (mammogram) every year.  If you have a family history of breast cancer, talk to your health care provider about genetic screening.  If you   are at high risk for breast cancer, talk to your health care provider about having an MRI and a mammogram every year.  Breast cancer gene (BRCA) assessment is recommended for women who have family members with BRCA-related cancers. BRCA-related cancers include:  Breast.  Ovarian.  Tubal.  Peritoneal cancers.  Results of the assessment will determine the need for genetic counseling and BRCA1 and BRCA2 testing. Cervical Cancer Your health care provider may recommend that you be screened regularly for cancer of the pelvic organs (ovaries, uterus, and  vagina). This screening involves a pelvic examination, including checking for microscopic changes to the surface of your cervix (Pap test). You may be encouraged to have this screening done every 3 years, beginning at age 21.  For women ages 30-65, health care providers may recommend pelvic exams and Pap testing every 3 years, or they may recommend the Pap and pelvic exam, combined with testing for human papilloma virus (HPV), every 5 years. Some types of HPV increase your risk of cervical cancer. Testing for HPV may also be done on women of any age with unclear Pap test results.  Other health care providers may not recommend any screening for nonpregnant women who are considered low risk for pelvic cancer and who do not have symptoms. Ask your health care provider if a screening pelvic exam is right for you.  If you have had past treatment for cervical cancer or a condition that could lead to cancer, you need Pap tests and screening for cancer for at least 20 years after your treatment. If Pap tests have been discontinued, your risk factors (such as having a new sexual partner) need to be reassessed to determine if screening should resume. Some women have medical problems that increase the chance of getting cervical cancer. In these cases, your health care provider may recommend more frequent screening and Pap tests. Colorectal Cancer  This type of cancer can be detected and often prevented.  Routine colorectal cancer screening usually begins at 60 years of age and continues through 60 years of age.  Your health care provider may recommend screening at an earlier age if you have risk factors for colon cancer.  Your health care provider may also recommend using home test kits to check for hidden blood in the stool.  A small camera at the end of a tube can be used to examine your colon directly (sigmoidoscopy or colonoscopy). This is done to check for the earliest forms of colorectal  cancer.  Routine screening usually begins at age 50.  Direct examination of the colon should be repeated every 5-10 years through 60 years of age. However, you may need to be screened more often if early forms of precancerous polyps or small growths are found. Skin Cancer  Check your skin from head to toe regularly.  Tell your health care provider about any new moles or changes in moles, especially if there is a change in a mole's shape or color.  Also tell your health care provider if you have a mole that is larger than the size of a pencil eraser.  Always use sunscreen. Apply sunscreen liberally and repeatedly throughout the day.  Protect yourself by wearing long sleeves, pants, a wide-brimmed hat, and sunglasses whenever you are outside. HEART DISEASE, DIABETES, AND HIGH BLOOD PRESSURE   High blood pressure causes heart disease and increases the risk of stroke. High blood pressure is more likely to develop in:  People who have blood pressure in the high end   of the normal range (130-139/85-89 mm Hg).  People who are overweight or obese.  People who are African American.  If you are 38-23 years of age, have your blood pressure checked every 3-5 years. If you are 61 years of age or older, have your blood pressure checked every year. You should have your blood pressure measured twice--once when you are at a hospital or clinic, and once when you are not at a hospital or clinic. Record the average of the two measurements. To check your blood pressure when you are not at a hospital or clinic, you can use:  An automated blood pressure machine at a pharmacy.  A home blood pressure monitor.  If you are between 45 years and 39 years old, ask your health care provider if you should take aspirin to prevent strokes.  Have regular diabetes screenings. This involves taking a blood sample to check your fasting blood sugar level.  If you are at a normal weight and have a low risk for diabetes,  have this test once every three years after 60 years of age.  If you are overweight and have a high risk for diabetes, consider being tested at a younger age or more often. PREVENTING INFECTION  Hepatitis B  If you have a higher risk for hepatitis B, you should be screened for this virus. You are considered at high risk for hepatitis B if:  You were born in a country where hepatitis B is common. Ask your health care provider which countries are considered high risk.  Your parents were born in a high-risk country, and you have not been immunized against hepatitis B (hepatitis B vaccine).  You have HIV or AIDS.  You use needles to inject street drugs.  You live with someone who has hepatitis B.  You have had sex with someone who has hepatitis B.  You get hemodialysis treatment.  You take certain medicines for conditions, including cancer, organ transplantation, and autoimmune conditions. Hepatitis C  Blood testing is recommended for:  Everyone born from [birth year] through 1965.  Anyone with known risk factors for hepatitis C. Sexually transmitted infections (STIs)  You should be screened for sexually transmitted infections (STIs) including gonorrhea and chlamydia if:  You are sexually active and are younger than 60 years of age.  You are older than 60 years of age and your health care provider tells you that you are at risk for this type of infection.  Your sexual activity has changed since you were last screened and you are at an increased risk for chlamydia or gonorrhea. Ask your health care provider if you are at risk.  If you do not have HIV, but are at risk, it may be recommended that you take a prescription medicine daily to prevent HIV infection. This is called pre-exposure prophylaxis (PrEP). You are considered at risk if:  You are sexually active and do not regularly use condoms or know the HIV status of your partner(s).  You take drugs by injection.  You are sexually  active with a partner who has HIV. Talk with your health care provider about whether you are at high risk of being infected with HIV. If you choose to begin PrEP, you should first be tested for HIV. You should then be tested every 3 months for as long as you are taking PrEP.  PREGNANCY   If you are premenopausal and you may become pregnant, ask your health care provider about preconception counseling.  If you may  become pregnant, take 400 to 800 micrograms (mcg) of folic acid every day.  If you want to prevent pregnancy, talk to your health care provider about birth control (contraception). OSTEOPOROSIS AND MENOPAUSE   Osteoporosis is a disease in which the bones lose minerals and strength with aging. This can result in serious bone fractures. Your risk for osteoporosis can be identified using a bone density scan.  If you are 61 years of age or older, or if you are at risk for osteoporosis and fractures, ask your health care provider if you should be screened.  Ask your health care provider whether you should take a calcium or vitamin D supplement to lower your risk for osteoporosis.  Menopause may have certain physical symptoms and risks.  Hormone replacement therapy may reduce some of these symptoms and risks. Talk to your health care provider about whether hormone replacement therapy is right for you.  HOME CARE INSTRUCTIONS   Schedule regular health, dental, and eye exams.  Stay current with your immunizations.   Do not use any tobacco products including cigarettes, chewing tobacco, or electronic cigarettes.  If you are pregnant, do not drink alcohol.  If you are breastfeeding, limit how much and how often you drink alcohol.  Limit alcohol intake to no more than 1 drink per day for nonpregnant women. One drink equals 12 ounces of beer, 5 ounces of wine, or 1 ounces of hard liquor.  Do not use street drugs.  Do not share needles.  Ask your health care provider for help if  you need support or information about quitting drugs.  Tell your health care provider if you often feel depressed.  Tell your health care provider if you have ever been abused or do not feel safe at home.   This information is not intended to replace advice given to you by your health care provider. Make sure you discuss any questions you have with your health care provider.   Document Released: 08/11/2010 Document Revised: 02/16/2014 Document Reviewed: 12/28/2012 Elsevier Interactive Patient Education [employer].

## 2015-11-07 ENCOUNTER — Other Ambulatory Visit: Payer: Self-pay | Admitting: Family

## 2015-11-07 ENCOUNTER — Telehealth: Payer: Self-pay | Admitting: Family

## 2015-11-07 LAB — CMP14+EGFR
A/G RATIO: 1.6 (ref 1.2–2.2)
ALT: 19 IU/L (ref 0–32)
AST: 19 IU/L (ref 0–40)
Albumin: 4.3 g/dL (ref 3.5–5.5)
Alkaline Phosphatase: 76 IU/L (ref 39–117)
BUN / CREAT RATIO: 19 (ref 9–23)
BUN: 13 mg/dL (ref 6–24)
Bilirubin Total: 0.3 mg/dL (ref 0.0–1.2)
CALCIUM: 9.3 mg/dL (ref 8.7–10.2)
CO2: 23 mmol/L (ref 18–29)
CREATININE: 0.7 mg/dL (ref 0.57–1.00)
Chloride: 100 mmol/L (ref 96–106)
GFR, EST AFRICAN AMERICAN: 110 mL/min/{1.73_m2} (ref >59)
GFR, EST NON AFRICAN AMERICAN: 95 mL/min/{1.73_m2} (ref >59)
GLUCOSE: 92 mg/dL (ref 65–99)
Globulin, Total: 2.7 g/dL (ref 1.5–4.5)
POTASSIUM: 4.7 mmol/L (ref 3.5–5.2)
SODIUM: 139 mmol/L (ref 134–144)
Total Protein: 7 g/dL (ref 6.0–8.5)

## 2015-11-07 LAB — HEPATITIS C ANTIBODY: HEP C VIRUS AB: 0.1 {s_co_ratio} (ref 0.0–0.9)

## 2015-11-07 LAB — LIPID PANEL
CHOL/HDL RATIO: 6 ratio — AB (ref 0.0–4.4)
Cholesterol, Total: 268 mg/dL — ABNORMAL HIGH (ref 100–199)
HDL: 45 mg/dL (ref >39)
LDL CALC: 150 mg/dL — AB (ref 0–99)
Triglycerides: 363 mg/dL — ABNORMAL HIGH (ref 0–149)
VLDL Cholesterol Cal: 73 mg/dL — ABNORMAL HIGH (ref 5–40)

## 2015-11-07 MED ORDER — ATORVASTATIN CALCIUM 20 MG PO TABS: 20.0000 mg | ORAL_TABLET | Freq: Every day | ORAL | 3 refills | Status: DC

## 2015-11-19 ENCOUNTER — Telehealth: Payer: Self-pay | Admitting: Family

## 2015-11-20 NOTE — Telephone Encounter (Signed)
Labs mailed

## 2015-12-27 ENCOUNTER — Other Ambulatory Visit: Payer: Self-pay | Admitting: Family

## 2015-12-27 MED ORDER — SULFAMETHOXAZOLE-TRIMETHOPRIM 800-160 MG PO TABS: 1.0000 | ORAL_TABLET | Freq: Two times a day (BID) | ORAL | 0 refills | Status: DC

## 2015-12-27 NOTE — Telephone Encounter (Signed)
Patient aware.

## 2015-12-27 NOTE — Telephone Encounter (Signed)
Patient is having back pain, burning with urination and strong urine odor beginning yesterday.  We have no appointments available today and so she has [facility]  Please advise.

## 2015-12-27 NOTE — Telephone Encounter (Signed)
Bactrim Prescription sent to pharmacy  °

## 2015-12-28 ENCOUNTER — Ambulatory Visit: Payer: Managed Care, Other (non HMO) | Admitting: Family

## 2016-02-24 ENCOUNTER — Encounter: Payer: Self-pay | Admitting: Family Medicine

## 2016-02-24 ENCOUNTER — Ambulatory Visit (INDEPENDENT_AMBULATORY_CARE_PROVIDER_SITE_OTHER): Payer: Managed Care, Other (non HMO) | Admitting: Family Medicine

## 2016-02-24 VITALS — BP 117/67 | HR 78 | Temp 97.3°F | Ht 66.0 in | Wt 194.0 lb

## 2016-02-24 DIAGNOSIS — N3000 Acute cystitis without hematuria: Secondary | ICD-10-CM

## 2016-02-24 LAB — URINALYSIS, COMPLETE
Bilirubin, UA: NEGATIVE
Glucose, UA: NEGATIVE
Nitrite, UA: NEGATIVE
PH UA: 5.5 (ref 5.0–7.5)
PROTEIN UA: NEGATIVE
RBC UA: NEGATIVE
Specific Gravity, UA: 1.02 (ref 1.005–1.030)
Urobilinogen, Ur: 0.2 mg/dL (ref 0.2–1.0)

## 2016-02-24 LAB — MICROSCOPIC EXAMINATION: WBC, UA: >30 /hpf — AB (ref 0–?)

## 2016-02-24 MED ORDER — SULFAMETHOXAZOLE-TRIMETHOPRIM 800-160 MG PO TABS: 1.0000 | ORAL_TABLET | Freq: Two times a day (BID) | ORAL | 0 refills | Status: DC

## 2016-02-24 NOTE — Progress Notes (Signed)
BP 117/67 (BP Location: Left Arm)   Pulse 78   Temp 97.3 F (36.3 C) (Oral)   Ht 5\' 6"  (1.676 m)   Wt 194 lb (88 kg)   BMI 31.31 kg/m    Subjective:    Patient ID: Jane Howard, female    DOB: 1955-06-02, 61 y.o.   MRN: [id number]  HPI: Jane Howard is a 61 y.o. female presenting on 02/24/2016 for Urinary Tract Infection   HPI Dysuria Patient has been having dysuria that's been going on for the past 2 days. She also complains of nocturia and urinary frequency. She denies any blood or abdominal pain or fevers. She has been getting these frequently about every couple of months. She is most recently treated 1 month ago with Bactrim and that cleared up. She has been having darker urine more recently. She is also been trying to use some cranberry supplements and Tylenol but have not been helping much.  Relevant past medical, surgical, family and social history reviewed and updated as indicated. Interim medical history since our last visit reviewed. Allergies and medications reviewed and updated.  Review of Systems  Constitutional: Negative for chills and fever.  Respiratory: Negative for chest tightness and shortness of breath.   Cardiovascular: Negative for chest pain and leg swelling.  Genitourinary: Positive for dysuria, frequency and urgency. Negative for difficulty urinating, hematuria, vaginal bleeding, vaginal discharge and vaginal pain.  Musculoskeletal: Negative for back pain and gait problem.  Skin: Negative for rash.  Neurological: Negative for light-headedness and headaches.  Psychiatric/Behavioral: Negative for agitation and behavioral problems.  All other systems reviewed and are negative.   Per HPI unless specifically indicated above     Objective:    BP 117/67 (BP Location: Left Arm)   Pulse 78   Temp 97.3 F (36.3 C) (Oral)   Ht 5\' 6"  (1.676 m)   Wt 194 lb (88 kg)   BMI 31.31 kg/m   Wt Readings from Last 3 Encounters:  02/24/16 194 lb (88 kg)    11/06/15 190 lb (86.2 kg)  03/28/15 186 lb (84.4 kg)    Physical Exam  Constitutional: She is oriented to person, place, and time. She appears well-developed and well-nourished. No distress.  Eyes: Conjunctivae are normal.  Cardiovascular: Normal rate, regular rhythm, normal heart sounds and intact distal pulses.   No murmur heard. Pulmonary/Chest: Effort normal and breath sounds normal. No respiratory distress. She has no wheezes. She has no rales.  Abdominal: Soft. Bowel sounds are normal. She exhibits no distension. There is no tenderness (No abdominal tenderness or CVA tenderness on exam). There is no rebound and no guarding.  Musculoskeletal: Normal range of motion. She exhibits no edema or tenderness.  Neurological: She is alert and oriented to person, place, and time. Coordination normal.  Skin: Skin is warm and dry. No rash noted. She is not diaphoretic.  Psychiatric: She has a normal mood and affect. Her behavior is normal.  Nursing note and vitals reviewed.  Urinalysis: Greater than 30 WBCs, 0-2 RBCs, 0-10 epithelial cells, 0-10 renal epithelial cells, few bacteria    Assessment & Plan:   Problem List Items Addressed This Visit    None    Visit Diagnoses    Acute cystitis without hematuria    -  Primary   Relevant Medications   sulfamethoxazole-trimethoprim (BACTRIM DS) 800-160 MG tablet   Other Relevant Orders   Urinalysis, Complete (Completed)   Urine culture       Follow  up plan: Return if symptoms worsen or fail to improve.  Counseling provided for all of the vaccine components Orders Placed This Encounter  Procedures  . Microscopic Examination  . Urine culture  . Urinalysis, Complete    Arville Care, MD [facility] 02/24/2016, 5:43 PM

## 2016-02-26 LAB — URINE CULTURE

## 2016-06-02 ENCOUNTER — Ambulatory Visit (INDEPENDENT_AMBULATORY_CARE_PROVIDER_SITE_OTHER): Payer: PRIVATE HEALTH INSURANCE | Admitting: Family Medicine

## 2016-06-02 ENCOUNTER — Encounter: Payer: Self-pay | Admitting: Family Medicine

## 2016-06-02 VITALS — BP 113/67 | HR 90 | Temp 98.5°F | Ht 66.0 in | Wt 192.0 lb

## 2016-06-02 DIAGNOSIS — N3090 Cystitis, unspecified without hematuria: Secondary | ICD-10-CM | POA: Diagnosis not present

## 2016-06-02 DIAGNOSIS — R399 Unspecified symptoms and signs involving the genitourinary system: Secondary | ICD-10-CM | POA: Diagnosis not present

## 2016-06-02 LAB — URINALYSIS
Bilirubin, UA: NEGATIVE
GLUCOSE, UA: NEGATIVE
Ketones, UA: NEGATIVE
Nitrite, UA: POSITIVE — AB
PH UA: 6 (ref 5.0–7.5)
PROTEIN UA: NEGATIVE
Specific Gravity, UA: <1.005 — ABNORMAL LOW (ref 1.005–1.030)
Urobilinogen, Ur: 0.2 mg/dL (ref 0.2–1.0)

## 2016-06-02 MED ORDER — CIPROFLOXACIN HCL 250 MG PO TABS: 250.0000 mg | ORAL_TABLET | Freq: Two times a day (BID) | ORAL | 0 refills | Status: DC

## 2016-06-02 NOTE — Addendum Note (Signed)
Addended by: Margurite Auerbach on: 06/02/2016 06:12 PM   Modules accepted: Orders

## 2016-06-02 NOTE — Progress Notes (Signed)
Subjective:  Patient ID: Jane Howard, female    DOB: 18-Dec-1955  Age: 61 y.o. MRN: [id number]  CC: Urinary Tract Infection (pt here today c/o dysuria and urinary urgency)   HPI Jane Howard presents for burning with urination and frequency for several days. Denies fever . No flank pain. No nausea, vomiting.   History Jane Howard has a past medical history of Anxiety and Nephrolithiasis.   Jane Howard has a past surgical history that includes Tonsillectomy; Foot surgery (Bilateral); Abdominal hysterectomy; and Nasal septum surgery.   Her family history includes Cancer in her father; Heart disease in her father and mother.Jane Howard reports that Jane Howard has never smoked. Jane Howard has never used smokeless tobacco. Jane Howard reports that Jane Howard does not drink alcohol or use drugs.  Current Outpatient Prescriptions on File Prior to Visit  Medication Sig Dispense Refill  . allopurinol (ZYLOPRIM) 100 MG tablet Take 1 tablet (100 mg total) by mouth daily. 90 tablet 1  . atorvastatin (LIPITOR) 20 MG tablet Take 1 tablet (20 mg total) by mouth daily. 90 tablet 3  . clonazePAM (KLONOPIN) 0.5 MG tablet Take 1 tablet (0.5 mg total) by mouth 2 (two) times daily as needed for anxiety. 180 tablet 0  . lisinopril-hydrochlorothiazide (ZESTORETIC) 20-12.5 MG tablet Take 1 tablet by mouth daily. 90 tablet 3   No current facility-administered medications on file prior to visit.     ROS Review of Systems  Constitutional: Negative for chills, diaphoresis and fever.  HENT: Negative for congestion.   Eyes: Negative for visual disturbance.  Respiratory: Negative for cough and shortness of breath.   Cardiovascular: Negative for chest pain and palpitations.  Gastrointestinal: Negative for constipation, diarrhea and nausea.  Genitourinary: Positive for dysuria, frequency and urgency. Negative for decreased urine volume, flank pain, hematuria, menstrual problem and pelvic pain.  Musculoskeletal: Negative for arthralgias and joint  swelling.  Skin: Negative for rash.  Neurological: Negative for dizziness and numbness.    Objective:  BP 113/67   Pulse 90   Temp 98.5 F (36.9 C) (Oral)   Ht  (1.676 m)   Wt 192 lb (87.1 kg)   BMI 30.99 kg/m   Physical Exam  Constitutional: Jane Howard is oriented to person, place, and time. Jane Howard appears well-developed and well-nourished.  HENT:  Head: Normocephalic and atraumatic.  Cardiovascular: Normal rate and regular rhythm.   No murmur heard. Pulmonary/Chest: Effort normal and breath sounds normal.  Abdominal: Soft. Bowel sounds are normal. Jane Howard exhibits no mass. There is no tenderness. There is no rebound and no guarding.  Musculoskeletal: Jane Howard exhibits no tenderness.  Neurological: Jane Howard is alert and oriented to person, place, and time.  Skin: Skin is warm and dry.  Psychiatric: Jane Howard has a normal mood and affect. Her behavior is normal.    Assessment & Plan:   Jane Howard was seen today for urinary tract infection.  Diagnoses and all orders for this visit:  UTI symptoms -     Urinalysis  Cystitis -     ciprofloxacin (CIPRO) 250 MG tablet; Take 1 tablet (250 mg total) by mouth 2 (two) times daily.   I have discontinued Jane Howard sulfamethoxazole-trimethoprim. I am also having her start on ciprofloxacin. Additionally, I am having her maintain her allopurinol, clonazePAM, lisinopril-hydrochlorothiazide, and atorvastatin.  Meds ordered this encounter  Medications  . ciprofloxacin (CIPRO) 250 MG tablet    Sig: Take 1 tablet (250 mg total) by mouth 2 (two) times daily.    Dispense:  10 tablet  Refill:  0     Follow-up: Return if symptoms worsen or fail to improve.  Claretta Fraise, M.D.

## 2016-06-05 ENCOUNTER — Telehealth: Payer: Self-pay | Admitting: Family Medicine

## 2016-06-05 ENCOUNTER — Other Ambulatory Visit: Payer: Self-pay | Admitting: Family Medicine

## 2016-06-05 LAB — URINE CULTURE

## 2016-06-05 MED ORDER — SULFAMETHOXAZOLE-TRIMETHOPRIM 800-160 MG PO TABS: 1.0000 | ORAL_TABLET | Freq: Two times a day (BID) | ORAL | 0 refills | Status: DC

## 2016-06-05 NOTE — Telephone Encounter (Signed)
Med was changed and pt called - we will call her when culture is back as well.

## 2016-06-05 NOTE — Telephone Encounter (Signed)
What symptoms do you have? Going to the bathroom a lot and pain  How long have you been sick? Since last Saturday  Have you been seen for this problem? yes  If your provider decides to give you a prescription, which pharmacy would you like for it to be sent to? [facility] Will finish ABX 4-28 and the symptoms are the same. No better. Wants another ABX called in and something for the burning.   Patient informed that this information will be sent to the clinical staff for review and that they should receive a follow up call.

## 2016-06-05 NOTE — Telephone Encounter (Signed)
Should have 2 days of cipro left  Final culture not back yet

## 2016-06-05 NOTE — Telephone Encounter (Signed)
Please contact the patient : I sent in a replacement antibiotic. Hopefully will have the culture back soon.

## 2016-07-10 ENCOUNTER — Telehealth: Payer: Self-pay | Admitting: Family

## 2016-07-10 NOTE — Telephone Encounter (Signed)
[facility] has at [facility]

## 2016-07-25 ENCOUNTER — Telehealth: Payer: Self-pay | Admitting: Family

## 2016-07-25 ENCOUNTER — Other Ambulatory Visit: Payer: Self-pay | Admitting: Family Medicine

## 2016-07-25 MED ORDER — CIPROFLOXACIN HCL 250 MG PO TABS
250.0000 mg | ORAL_TABLET | Freq: Two times a day (BID) | ORAL | 0 refills | Status: DC
Start: 2016-07-25 — End: 2016-11-27

## 2016-07-25 NOTE — Telephone Encounter (Signed)
Pt aware.

## 2016-07-25 NOTE — Telephone Encounter (Signed)
[facility]

## 2016-07-25 NOTE — Telephone Encounter (Signed)
What symptoms do you have? Starting to burn, feeling like she needs to go to restroom every few minutes, urine smell  How long have you been sick? Smell for a few days, and feeling like she needs to go frequently just started this morning  Have you been seen for this problem? No, she is at work wants to know if med can be sent to pharmacy  If your provider decides to give you a prescription, which pharmacy would you [facility] be [facility]   Patient informed that this information will be sent to the clinical staff for review and that they should receive a follow up call.

## 2016-11-27 ENCOUNTER — Encounter: Payer: Self-pay | Admitting: Family

## 2016-11-27 ENCOUNTER — Ambulatory Visit (INDEPENDENT_AMBULATORY_CARE_PROVIDER_SITE_OTHER): Payer: PRIVATE HEALTH INSURANCE | Admitting: Family

## 2016-11-27 VITALS — BP 128/75 | HR 88 | Temp 97.9°F | Ht 66.0 in | Wt 195.2 lb

## 2016-11-27 DIAGNOSIS — Z0000 Encounter for general adult medical examination without abnormal findings: Secondary | ICD-10-CM

## 2016-11-27 DIAGNOSIS — F411 Generalized anxiety disorder: Secondary | ICD-10-CM

## 2016-11-27 DIAGNOSIS — E785 Hyperlipidemia, unspecified: Secondary | ICD-10-CM | POA: Insufficient documentation

## 2016-11-27 DIAGNOSIS — M1A9XX0 Chronic gout, unspecified, without tophus (tophi): Secondary | ICD-10-CM

## 2016-11-27 DIAGNOSIS — I10 Essential (primary) hypertension: Secondary | ICD-10-CM

## 2016-11-27 DIAGNOSIS — Z23 Encounter for immunization: Secondary | ICD-10-CM

## 2016-11-27 DIAGNOSIS — R300 Dysuria: Secondary | ICD-10-CM

## 2016-11-27 DIAGNOSIS — E669 Obesity, unspecified: Secondary | ICD-10-CM

## 2016-11-27 LAB — MICROSCOPIC EXAMINATION

## 2016-11-27 LAB — URINALYSIS, COMPLETE
Bilirubin, UA: NEGATIVE
GLUCOSE, UA: NEGATIVE
KETONES UA: NEGATIVE
NITRITE UA: NEGATIVE
Protein, UA: NEGATIVE
SPEC GRAV UA: 1.015 (ref 1.005–1.030)
UUROB: 0.2 mg/dL (ref 0.2–1.0)
pH, UA: 5.5 (ref 5.0–7.5)

## 2016-11-27 MED ORDER — LISINOPRIL-HYDROCHLOROTHIAZIDE 20-12.5 MG PO TABS: 1.0000 | ORAL_TABLET | Freq: Every day | ORAL | 3 refills | Status: DC

## 2016-11-27 MED ORDER — SULFAMETHOXAZOLE-TRIMETHOPRIM 800-160 MG PO TABS: 1.0000 | ORAL_TABLET | Freq: Two times a day (BID) | ORAL | 0 refills | Status: DC

## 2016-11-27 MED ORDER — ATORVASTATIN CALCIUM 20 MG PO TABS: 20.0000 mg | ORAL_TABLET | Freq: Every day | ORAL | 3 refills | Status: DC

## 2016-11-27 MED ORDER — ALLOPURINOL 100 MG PO TABS: 100.0000 mg | ORAL_TABLET | Freq: Every day | ORAL | 1 refills | Status: DC

## 2016-11-27 MED ORDER — CLONAZEPAM 0.5 MG PO TABS: 0.5000 mg | ORAL_TABLET | Freq: Two times a day (BID) | ORAL | 5 refills | Status: DC | PRN

## 2016-11-27 MED ORDER — ESCITALOPRAM OXALATE 10 MG PO TABS
10.0000 mg | ORAL_TABLET | Freq: Every day | ORAL | 3 refills | Status: DC
Start: 2016-11-27 — End: 2017-02-19

## 2016-11-27 NOTE — Progress Notes (Signed)
Subjective:    Patient ID: Jane Howard, female    DOB: 05-24-1955, 61 y.o.   MRN: [id number]  PT presents to the office today for CPE without pap.  Hypertension  This is a chronic problem. The current episode started more than 1 year ago. The problem has been resolved since onset. The problem is controlled. Associated symptoms include anxiety and malaise/fatigue. Pertinent negatives include no peripheral edema or shortness of breath. Risk factors for coronary artery disease include dyslipidemia, obesity, sedentary lifestyle and family history. The current treatment provides moderate improvement. There is no history of kidney disease, CAD/MI, CVA or heart failure.  Anxiety  Presents for follow-up visit. Symptoms include depressed mood, excessive worry, irritability, nausea and nervous/anxious behavior. Patient reports no shortness of breath. Symptoms occur occasionally. The severity of symptoms is moderate. The quality of sleep is good.    Dysuria   This is a recurrent problem. The current episode started yesterday. The problem has been waxing and waning. The quality of the pain is described as burning. The pain is at a severity of 6/10. Associated symptoms include frequency, nausea and urgency. She has tried increased fluids for the symptoms. The treatment provided no relief.  Gout  PT states her last "flare up" was years ago.     Review of Systems  Constitutional: Positive for irritability and malaise/fatigue.  Respiratory: Negative for shortness of breath.   Gastrointestinal: Positive for nausea.  Genitourinary: Positive for dysuria, frequency and urgency.  Psychiatric/Behavioral: The patient is nervous/anxious.   All other systems reviewed and are negative.      Objective:   Physical Exam  Constitutional: She is oriented to person, place, and time. She appears well-developed and well-nourished. No distress.  HENT:  Head: Normocephalic and atraumatic.  Right Ear: External  ear normal.  Left Ear: External ear normal.  Nose: Nose normal.  Mouth/Throat: Oropharynx is clear and moist.  Eyes: Pupils are equal, round, and reactive to light.  Neck: Normal range of motion. Neck supple. No thyromegaly present.  Cardiovascular: Normal rate, regular rhythm, normal heart sounds and intact distal pulses.   No murmur heard. Pulmonary/Chest: Effort normal and breath sounds normal. No respiratory distress. She has no wheezes.  Abdominal: Soft. Bowel sounds are normal. She exhibits no distension. There is no tenderness.  Musculoskeletal: Normal range of motion. She exhibits no edema or tenderness.  Neurological: She is alert and oriented to person, place, and time.  Skin: Skin is warm and dry.  Psychiatric: She has a normal mood and affect. Her behavior is normal. Judgment and thought content normal.  Vitals reviewed.     BP 128/75   Pulse 88   Temp 97.9 F (36.6 C) (Oral)   Ht '5\' 6"'  (1.676 m)   Wt 195 lb 3.2 oz (88.5 kg)   BMI 31.51 kg/m      Assessment & Plan:  1. Need for immunization against influenza - Flu Vaccine QUAD 36+ mos IM - CMP14+EGFR  2. GAD (generalized anxiety disorder)  - CMP14+EGFR - escitalopram (LEXAPRO) 10 MG tablet; Take 1 tablet (10 mg total) by mouth daily.  Dispense: 90 tablet; Refill: 3 - clonazePAM (KLONOPIN) 0.5 MG tablet; Take 1 tablet (0.5 mg total) by mouth 2 (two) times daily as needed for anxiety.  Dispense: 60 tablet; Refill: 5  3. Chronic gout without tophus, unspecified cause, unspecified site - CMP14+EGFR - Uric acid - allopurinol (ZYLOPRIM) 100 MG tablet; Take 1 tablet (100 mg total) by mouth daily.  Dispense: 90 tablet; Refill: 1  4. Essential hypertension, benign  - CMP14+EGFR - lisinopril-hydrochlorothiazide (ZESTORETIC) 20-12.5 MG tablet; Take 1 tablet by mouth daily.  Dispense: 90 tablet; Refill: 3  5. Obesity (BMI 30-39.9) - CMP14+EGFR  6. Annual physical exam  - CMP14+EGFR - Lipid panel - Anemia  Profile B - TSH - Uric acid - VITAMIN D 25 Hydroxy (Vit-D Deficiency, Fractures)  7. Hyperlipidemia, unspecified hyperlipidemia type  - atorvastatin (LIPITOR) 20 MG tablet; Take 1 tablet (20 mg total) by mouth daily.  Dispense: 90 tablet; Refill: 3  8. Dysuria - Urinalysis, Complete - Urine Culture - sulfamethoxazole-trimethoprim (BACTRIM DS) 800-160 MG tablet; Take 1 tablet by mouth 2 (two) times daily.  Dispense: 14 tablet; Refill: 0   Continue all meds Labs pending Health Maintenance reviewed Diet and exercise encouraged RTO 6 weeks to recheck GAD  Evelina Dun, FNP

## 2016-11-27 NOTE — Patient Instructions (Signed)

## 2016-11-28 LAB — ANEMIA PROFILE B
BASOS ABS: 0.1 10*3/uL (ref 0.0–0.2)
Basos: 1 %
EOS (ABSOLUTE): 0.2 10*3/uL (ref 0.0–0.4)
Eos: 2 %
Ferritin: 299 ng/mL — ABNORMAL HIGH (ref 15–150)
Folate: 8.1 ng/mL (ref >3.0)
Hematocrit: 37.9 % (ref 34.0–46.6)
Hemoglobin: 12.8 g/dL (ref 11.1–15.9)
IRON SATURATION: 17 % (ref 15–55)
IRON: 51 ug/dL (ref 27–159)
Immature Grans (Abs): 0 10*3/uL (ref 0.0–0.1)
Immature Granulocytes: 0 %
LYMPHS ABS: 3.3 10*3/uL — AB (ref 0.7–3.1)
Lymphs: 36 %
MCH: 29.8 pg (ref 26.6–33.0)
MCHC: 33.8 g/dL (ref 31.5–35.7)
MCV: 88 fL (ref 79–97)
MONOS ABS: 0.6 10*3/uL (ref 0.1–0.9)
Monocytes: 6 %
NEUTROS ABS: 5 10*3/uL (ref 1.4–7.0)
NEUTROS PCT: 55 %
PLATELETS: 311 10*3/uL (ref 150–379)
RBC: 4.3 x10E6/uL (ref 3.77–5.28)
RDW: 13 % (ref 12.3–15.4)
Retic Ct Pct: 2.2 % (ref 0.6–2.6)
Total Iron Binding Capacity: 304 ug/dL (ref 250–450)
UIBC: 253 ug/dL (ref 131–425)
WBC: 9.1 10*3/uL (ref 3.4–10.8)

## 2016-11-28 LAB — CMP14+EGFR
A/G RATIO: 1.9 (ref 1.2–2.2)
ALT: 29 IU/L (ref 0–32)
AST: 20 IU/L (ref 0–40)
Albumin: 4.5 g/dL (ref 3.6–4.8)
Alkaline Phosphatase: 72 IU/L (ref 39–117)
BUN/Creatinine Ratio: 23 (ref 12–28)
BUN: 23 mg/dL (ref 8–27)
Bilirubin Total: 0.2 mg/dL (ref 0.0–1.2)
CHLORIDE: 95 mmol/L — AB (ref 96–106)
CO2: 25 mmol/L (ref 20–29)
Calcium: 9.4 mg/dL (ref 8.7–10.3)
Creatinine, Ser: 1.01 mg/dL — ABNORMAL HIGH (ref 0.57–1.00)
GFR calc Af Amer: 70 mL/min/{1.73_m2} (ref >59)
GFR calc non Af Amer: 61 mL/min/{1.73_m2} (ref >59)
GLOBULIN, TOTAL: 2.4 g/dL (ref 1.5–4.5)
Glucose: 97 mg/dL (ref 65–99)
POTASSIUM: 4.7 mmol/L (ref 3.5–5.2)
SODIUM: 135 mmol/L (ref 134–144)
Total Protein: 6.9 g/dL (ref 6.0–8.5)

## 2016-11-28 LAB — LIPID PANEL
CHOLESTEROL TOTAL: 263 mg/dL — AB (ref 100–199)
Chol/HDL Ratio: 6 ratio — ABNORMAL HIGH (ref 0.0–4.4)
HDL: 44 mg/dL (ref >39)
TRIGLYCERIDES: 423 mg/dL — AB (ref 0–149)

## 2016-11-28 LAB — VITAMIN D 25 HYDROXY (VIT D DEFICIENCY, FRACTURES): Vit D, 25-Hydroxy: 15.4 ng/mL — ABNORMAL LOW (ref 30.0–100.0)

## 2016-11-28 LAB — TSH: TSH: 1.07 u[IU]/mL (ref 0.450–4.500)

## 2016-11-28 LAB — URIC ACID: Uric Acid: 4.3 mg/dL (ref 2.5–7.1)

## 2016-11-29 LAB — URINE CULTURE

## 2016-12-01 ENCOUNTER — Telehealth: Payer: Self-pay | Admitting: *Deleted

## 2016-12-01 ENCOUNTER — Other Ambulatory Visit: Payer: Self-pay | Admitting: Family

## 2016-12-01 DIAGNOSIS — E781 Pure hyperglyceridemia: Secondary | ICD-10-CM

## 2016-12-01 DIAGNOSIS — E559 Vitamin D deficiency, unspecified: Secondary | ICD-10-CM | POA: Insufficient documentation

## 2016-12-01 MED ORDER — FENOFIBRATE 145 MG PO TABS: 145.0000 mg | ORAL_TABLET | Freq: Every day | ORAL | 1 refills | Status: DC

## 2016-12-01 MED ORDER — VITAMIN D (ERGOCALCIFEROL) 1.25 MG (50000 UNIT) PO CAPS: 50000.0000 [IU] | ORAL_CAPSULE | ORAL | 3 refills | Status: DC

## 2016-12-01 NOTE — Telephone Encounter (Signed)
Notified that prescription is available at the front desk for pickup with a photo ID. 

## 2016-12-02 ENCOUNTER — Other Ambulatory Visit: Payer: Self-pay | Admitting: Family

## 2016-12-02 ENCOUNTER — Telehealth: Payer: Self-pay | Admitting: Family

## 2016-12-02 MED ORDER — CIPROFLOXACIN HCL 500 MG PO TABS: 500.0000 mg | ORAL_TABLET | Freq: Two times a day (BID) | ORAL | 0 refills | Status: DC

## 2016-12-02 NOTE — Telephone Encounter (Signed)
Patient aware.

## 2016-12-02 NOTE — Telephone Encounter (Signed)
Refer to lab note- rx was never sent in. Please send in RX.

## 2016-12-02 NOTE — Telephone Encounter (Signed)
Prescription sent to pharmacy.

## 2017-02-19 ENCOUNTER — Encounter: Payer: Self-pay | Admitting: Nurse Practitioner

## 2017-02-19 ENCOUNTER — Ambulatory Visit (INDEPENDENT_AMBULATORY_CARE_PROVIDER_SITE_OTHER): Payer: PRIVATE HEALTH INSURANCE | Admitting: Nurse Practitioner

## 2017-02-19 ENCOUNTER — Other Ambulatory Visit: Payer: Self-pay | Admitting: *Deleted

## 2017-02-19 VITALS — BP 130/74 | HR 70 | Temp 97.6°F | Ht 66.0 in | Wt 202.0 lb

## 2017-02-19 DIAGNOSIS — N390 Urinary tract infection, site not specified: Secondary | ICD-10-CM | POA: Diagnosis not present

## 2017-02-19 DIAGNOSIS — N3000 Acute cystitis without hematuria: Secondary | ICD-10-CM | POA: Diagnosis not present

## 2017-02-19 DIAGNOSIS — R300 Dysuria: Secondary | ICD-10-CM | POA: Diagnosis not present

## 2017-02-19 DIAGNOSIS — F411 Generalized anxiety disorder: Secondary | ICD-10-CM

## 2017-02-19 LAB — URINALYSIS
BILIRUBIN UA: NEGATIVE
GLUCOSE, UA: NEGATIVE
Nitrite, UA: NEGATIVE
Specific Gravity, UA: 1.02 (ref 1.005–1.030)
UUROB: 1 mg/dL (ref 0.2–1.0)
pH, UA: 7 (ref 5.0–7.5)

## 2017-02-19 MED ORDER — ESCITALOPRAM OXALATE 10 MG PO TABS: 10.0000 mg | ORAL_TABLET | Freq: Every day | ORAL | 0 refills | Status: DC

## 2017-02-19 MED ORDER — CIPROFLOXACIN HCL 500 MG PO TABS: 500.0000 mg | ORAL_TABLET | Freq: Two times a day (BID) | ORAL | 0 refills | Status: DC

## 2017-02-19 NOTE — Progress Notes (Signed)
   Subjective:    Patient ID: Gust BroomsJeanette D Christmas, female    DOB: Apr 12, 1955, 62 y.o.   MRN: [id number]  HPI  Patient comes in today c/o dysuria and frequency that started early this morning. Has been getting UTI every 2 months for over a year.   Review of Systems  Constitutional: Negative.   HENT: Negative.   Respiratory: Negative.   Cardiovascular: Negative.   Gastrointestinal: Negative.   Genitourinary: Positive for dysuria, frequency and urgency. Negative for pelvic pain.  Neurological: Negative.   Psychiatric/Behavioral: Negative.   All other systems reviewed and are negative.      Objective:   Physical Exam  Constitutional: She is oriented to person, place, and time. She appears well-developed and well-nourished. No distress.  Cardiovascular: Normal rate and regular rhythm.  Pulmonary/Chest: Effort normal and breath sounds normal.  Neurological: She is alert and oriented to person, place, and time.  Psychiatric: She has a normal mood and affect. Her behavior is normal. Judgment and thought content normal.   BP 130/74   Pulse 70   Temp 97.6 F (36.4 C) (Oral)   Ht 5\' 6"  (1.676 m)   Wt 202 lb (91.6 kg)   BMI 32.60 kg/m        Assessment & Plan:   1. Dysuria   2. Acute cystitis without hematuria   3. Recurrent UTI    Meds ordered this encounter  Medications  . ciprofloxacin (CIPRO) 500 MG tablet    Sig: Take 1 tablet (500 mg total) by mouth 2 (two) times daily.    Dispense:  10 tablet    Refill:  0    Order Specific Question:   Supervising Provider    Answer:   Oswaldo DoneVINCENT, CAROL L [[id number]]   Take medication as prescribe Cotton underwear Take shower not bath Cranberry juice, yogurt Force fluids AZO over the counter X2 days Culture pending RTO prn  Orders Placed This Encounter  Procedures  . Urine Culture    Cannot see [facility] because insurance will not pay  . Urinalysis  . Ambulatory referral to Urology    Referral Priority:   Routine   Referral Type:   Consultation    Referral Reason:   Specialty Services Required    Requested Specialty:   Urology    Number of Visits Requested:   1   Mary-Margaret Daphine DeutscherMartin, [place]NP

## 2017-02-22 LAB — URINE CULTURE

## 2017-03-26 ENCOUNTER — Encounter: Payer: Self-pay | Admitting: Family Medicine

## 2017-03-26 ENCOUNTER — Ambulatory Visit (INDEPENDENT_AMBULATORY_CARE_PROVIDER_SITE_OTHER): Payer: PRIVATE HEALTH INSURANCE | Admitting: Family Medicine

## 2017-03-26 VITALS — BP 122/70 | HR 97 | Temp 98.6°F | Ht 66.0 in | Wt 200.6 lb

## 2017-03-26 DIAGNOSIS — J0100 Acute maxillary sinusitis, unspecified: Secondary | ICD-10-CM

## 2017-03-26 MED ORDER — AZITHROMYCIN 250 MG PO TABS: ORAL_TABLET | ORAL | 0 refills | Status: DC

## 2017-03-26 MED ORDER — BENZONATATE 200 MG PO CAPS: 200.0000 mg | ORAL_CAPSULE | Freq: Two times a day (BID) | ORAL | 0 refills | Status: DC | PRN

## 2017-03-26 MED ORDER — FLUTICASONE PROPIONATE 50 MCG/ACT NA SUSP: 1.0000 | Freq: Two times a day (BID) | NASAL | 6 refills | Status: DC | PRN

## 2017-03-26 NOTE — Progress Notes (Signed)
BP 122/70 (BP Location: Left Arm, Patient Position: Sitting, Cuff Size: Normal)   Pulse 97   Temp 98.6 F (37 C) (Oral)   Ht 5\' 6"  (1.676 m)   Wt 200 lb 9.6 oz (91 kg)   BMI 32.38 kg/m    Subjective:    Patient ID: Jane BroomsJeanette D Somoza, female    DOB: 1955/04/27, 62 y.o.   MRN: [id number]  HPI: Jane Howard is a 62 y.o. female presenting on 03/26/2017 for URI (head and chest congestion x 1 wk, drainage, occ prod cough, R ear pain)   HPI Sinus congestion chest congestion and drainage and cough Patient comes in complaining of sinus congestion and chest congestion and headache and cough and postnasal drainage is been going on for the past 1 week.  She says that she has been trying to use NyQuil at night and Claritin-D during the day and it was helping initially but that has worsened over the past couple days.  Now she has pressure in her right ear that goes down her right jaw along with the right sided sinus pressure.  She denies any fevers or chills or shortness of breath or wheezing.  She denies any specific sick contacts that she knows of although many colds been going around her office.  Relevant past medical, surgical, family and social history reviewed and updated as indicated. Interim medical history since our last visit reviewed. Allergies and medications reviewed and updated.  Review of Systems  Constitutional: Negative for chills and fever.  HENT: Positive for congestion, ear pain, postnasal drip, rhinorrhea, sinus pressure, sneezing and sore throat. Negative for ear discharge.   Eyes: Negative for pain, redness and visual disturbance.  Respiratory: Positive for cough. Negative for chest tightness and shortness of breath.   Cardiovascular: Negative for chest pain and leg swelling.  Genitourinary: Negative for difficulty urinating and dysuria.  Musculoskeletal: Negative for back pain and gait problem.  Skin: Negative for rash.  Neurological: Negative for light-headedness and  headaches.  Psychiatric/Behavioral: Negative for agitation and behavioral problems.  All other systems reviewed and are negative.   Per HPI unless specifically indicated above        Objective:    BP 122/70 (BP Location: Left Arm, Patient Position: Sitting, Cuff Size: Normal)   Pulse 97   Temp 98.6 F (37 C) (Oral)   Ht 5\' 6"  (1.676 m)   Wt 200 lb 9.6 oz (91 kg)   BMI 32.38 kg/m   Wt Readings from Last 3 Encounters:  03/26/17 200 lb 9.6 oz (91 kg)  02/19/17 202 lb (91.6 kg)  11/27/16 195 lb 3.2 oz (88.5 kg)    Physical Exam  Constitutional: She is oriented to person, place, and time. She appears well-developed and well-nourished. No distress.  HENT:  Right Ear: Tympanic membrane, external ear and ear canal normal.  Left Ear: Tympanic membrane, external ear and ear canal normal.  Nose: Mucosal edema and rhinorrhea present. No epistaxis. Right sinus exhibits maxillary sinus tenderness. Right sinus exhibits no frontal sinus tenderness. Left sinus exhibits no maxillary sinus tenderness and no frontal sinus tenderness.  Mouth/Throat: Uvula is midline and mucous membranes are normal. Posterior oropharyngeal edema and posterior oropharyngeal erythema present. No oropharyngeal exudate or tonsillar abscesses.  Eyes: Conjunctivae and EOM are normal.  Neck: Neck supple. No thyromegaly present.  Cardiovascular: Normal rate, regular rhythm, normal heart sounds and intact distal pulses.  No murmur heard. Pulmonary/Chest: Effort normal and breath sounds normal. No respiratory distress. She  has no wheezes. She has no rales.  Musculoskeletal: Normal range of motion. She exhibits no edema or tenderness.  Lymphadenopathy:    She has no cervical adenopathy.  Neurological: She is alert and oriented to person, place, and time. Coordination normal.  Skin: Skin is warm and dry. No rash noted. She is not diaphoretic.  Psychiatric: She has a normal mood and affect. Her behavior is normal.  Vitals  reviewed.      Assessment & Plan:   Problem List Items Addressed This Visit    None    Visit Diagnoses    Acute non-recurrent maxillary sinusitis    -  Primary   Relevant Medications   fluticasone (FLONASE) 50 MCG/ACT nasal spray   benzonatate (TESSALON) 200 MG capsule   azithromycin (ZITHROMAX) 250 MG tablet       Follow up plan: Return if symptoms worsen or fail to improve.  Counseling provided for all of the vaccine components No orders of the defined types were placed in this encounter.   Arville Care, MD [facility] 03/26/2017, 5:39 PM

## 2017-05-21 ENCOUNTER — Other Ambulatory Visit: Payer: Self-pay | Admitting: Family

## 2017-05-21 NOTE — Telephone Encounter (Signed)
Last lipid 11/27/16 

## 2017-05-22 ENCOUNTER — Other Ambulatory Visit: Payer: Self-pay | Admitting: Family

## 2017-05-22 DIAGNOSIS — M1A9XX0 Chronic gout, unspecified, without tophus (tophi): Secondary | ICD-10-CM

## 2017-08-12 ENCOUNTER — Other Ambulatory Visit: Payer: Self-pay | Admitting: Family

## 2017-08-13 NOTE — Telephone Encounter (Signed)
Last lipid 11/27/16 

## 2017-08-19 ENCOUNTER — Other Ambulatory Visit: Payer: Self-pay | Admitting: Family

## 2017-08-19 DIAGNOSIS — M1A9XX0 Chronic gout, unspecified, without tophus (tophi): Secondary | ICD-10-CM

## 2017-10-28 ENCOUNTER — Other Ambulatory Visit: Payer: Self-pay | Admitting: Family

## 2017-11-12 ENCOUNTER — Other Ambulatory Visit: Payer: Self-pay | Admitting: Family

## 2017-11-12 DIAGNOSIS — E785 Hyperlipidemia, unspecified: Secondary | ICD-10-CM

## 2017-11-12 DIAGNOSIS — M1A9XX0 Chronic gout, unspecified, without tophus (tophi): Secondary | ICD-10-CM

## 2017-11-15 NOTE — Telephone Encounter (Signed)
Patient NTBS for follow up and lab work  

## 2017-11-15 NOTE — Telephone Encounter (Signed)
Left message NTBS 

## 2017-11-15 NOTE — Telephone Encounter (Signed)
Last seen 03/26/17  Last lipid 11/27/16

## 2017-11-16 ENCOUNTER — Encounter (HOSPITAL_COMMUNITY): Payer: Self-pay | Admitting: Emergency Medicine

## 2017-11-16 ENCOUNTER — Other Ambulatory Visit: Payer: Self-pay

## 2017-11-16 ENCOUNTER — Emergency Department (HOSPITAL_COMMUNITY)
Admission: EM | Admit: 2017-11-16 | Discharge: 2017-11-16 | Disposition: A | Discharge status: Home / Self Care | Payer: No Typology Code available for payment source | Attending: Emergency Medicine | Admitting: Emergency Medicine

## 2017-11-16 DIAGNOSIS — Z79899 Other long term (current) drug therapy: Secondary | ICD-10-CM | POA: Diagnosis not present

## 2017-11-16 DIAGNOSIS — D72829 Elevated white blood cell count, unspecified: Secondary | ICD-10-CM | POA: Diagnosis not present

## 2017-11-16 DIAGNOSIS — N3000 Acute cystitis without hematuria: Secondary | ICD-10-CM | POA: Diagnosis not present

## 2017-11-16 DIAGNOSIS — R55 Syncope and collapse: Secondary | ICD-10-CM

## 2017-11-16 DIAGNOSIS — Z7982 Long term (current) use of aspirin: Secondary | ICD-10-CM | POA: Diagnosis not present

## 2017-11-16 LAB — CBC WITH DIFFERENTIAL/PLATELET
ABS IMMATURE GRANULOCYTES: 0.14 10*3/uL — AB (ref 0.00–0.07)
BASOS ABS: 0.1 10*3/uL (ref 0.0–0.1)
Basophils Relative: 0 %
Eosinophils Absolute: 0 10*3/uL (ref 0.0–0.5)
Eosinophils Relative: 0 %
HEMATOCRIT: 41.6 % (ref 36.0–46.0)
Hemoglobin: 13.7 g/dL (ref 12.0–15.0)
IMMATURE GRANULOCYTES: 1 %
LYMPHS ABS: 2 10*3/uL (ref 0.7–4.0)
Lymphocytes Relative: 10 %
MCH: 29.3 pg (ref 26.0–34.0)
MCHC: 32.9 g/dL (ref 30.0–36.0)
MCV: 88.9 fL (ref 80.0–100.0)
MONOS PCT: 6 %
Monocytes Absolute: 1.2 10*3/uL — ABNORMAL HIGH (ref 0.1–1.0)
NEUTROS ABS: 16.7 10*3/uL — AB (ref 1.7–7.7)
NEUTROS PCT: 83 %
NRBC: 0 % (ref 0.0–0.2)
PLATELETS: 275 10*3/uL (ref 150–400)
RBC: 4.68 MIL/uL (ref 3.87–5.11)
RDW: 12.2 % (ref 11.5–15.5)
WBC: 20.1 10*3/uL — ABNORMAL HIGH (ref 4.0–10.5)

## 2017-11-16 LAB — URINALYSIS, ROUTINE W REFLEX MICROSCOPIC
BILIRUBIN URINE: NEGATIVE
GLUCOSE, UA: NEGATIVE mg/dL
HGB URINE DIPSTICK: NEGATIVE
Ketones, ur: NEGATIVE mg/dL
NITRITE: NEGATIVE
Protein, ur: 30 mg/dL — AB
SPECIFIC GRAVITY, URINE: 1.011 (ref 1.005–1.030)
pH: 6 (ref 5.0–8.0)

## 2017-11-16 LAB — COMPREHENSIVE METABOLIC PANEL
ALBUMIN: 3.6 g/dL (ref 3.5–5.0)
ALT: 26 U/L (ref 0–44)
ANION GAP: 13 (ref 5–15)
AST: 26 U/L (ref 15–41)
Alkaline Phosphatase: 75 U/L (ref 38–126)
BUN: 21 mg/dL (ref 8–23)
CHLORIDE: 101 mmol/L (ref 98–111)
CO2: 22 mmol/L (ref 22–32)
Calcium: 9.2 mg/dL (ref 8.9–10.3)
Creatinine, Ser: 1.07 mg/dL — ABNORMAL HIGH (ref 0.44–1.00)
GFR calc Af Amer: >60 mL/min (ref >60)
GFR calc non Af Amer: 55 mL/min — ABNORMAL LOW (ref >60)
GLUCOSE: 114 mg/dL — AB (ref 70–99)
POTASSIUM: 3 mmol/L — AB (ref 3.5–5.1)
SODIUM: 136 mmol/L (ref 135–145)
Total Bilirubin: 0.4 mg/dL (ref 0.3–1.2)
Total Protein: 6.9 g/dL (ref 6.5–8.1)

## 2017-11-16 LAB — LIPASE, BLOOD: Lipase: 30 U/L (ref 11–51)

## 2017-11-16 MED ORDER — KETOROLAC TROMETHAMINE 30 MG/ML IJ SOLN
30.0000 mg | Freq: Once | INTRAMUSCULAR | Status: AC
  Administered 2017-11-16: 30 mg via INTRAVENOUS
  Filled 2017-11-16: qty 1

## 2017-11-16 MED ORDER — PHENAZOPYRIDINE HCL 200 MG PO TABS: 200.0000 mg | ORAL_TABLET | Freq: Three times a day (TID) | ORAL | 0 refills | Status: DC

## 2017-11-16 MED ORDER — SODIUM CHLORIDE 0.9 % IV SOLN
1.0000 g | Freq: Once | INTRAVENOUS | Status: AC
  Administered 2017-11-16: 1 g via INTRAVENOUS
  Filled 2017-11-16: qty 10

## 2017-11-16 MED ORDER — CEPHALEXIN 500 MG PO CAPS: 500.0000 mg | ORAL_CAPSULE | Freq: Three times a day (TID) | ORAL | 0 refills | Status: DC

## 2017-11-16 MED ORDER — NAPROXEN 375 MG PO TABS: 375.0000 mg | ORAL_TABLET | Freq: Two times a day (BID) | ORAL | Status: DC

## 2017-11-16 MED ORDER — NAPROXEN 375 MG PO TABS: 375.0000 mg | ORAL_TABLET | Freq: Two times a day (BID) | ORAL | 0 refills | Status: AC

## 2017-11-16 MED ORDER — SODIUM CHLORIDE 0.9 % IV BOLUS
500.0000 mL | Freq: Once | INTRAVENOUS | Status: AC
  Administered 2017-11-16: 500 mL via INTRAVENOUS

## 2017-11-16 MED ORDER — SODIUM CHLORIDE 0.9 % IV SOLN
INTRAVENOUS | Status: DC
  Administered 2017-11-16: 16:00:00 via INTRAVENOUS

## 2017-11-16 MED ORDER — POTASSIUM CHLORIDE CRYS ER 20 MEQ PO TBCR
40.0000 meq | EXTENDED_RELEASE_TABLET | Freq: Once | ORAL | Status: AC
  Administered 2017-11-16: 40 meq via ORAL
  Filled 2017-11-16: qty 2

## 2017-11-16 NOTE — Discharge Instructions (Addendum)
Follow up with your doctor to be rechecked later tomorrow or the next day, return to the ED sooner if the symptoms are getting worse, or if you start having fevers

## 2017-11-16 NOTE — ED Provider Notes (Signed)
[facility] EMERGENCY DEPARTMENT Provider Note   CSN: [id number] Arrival date & time: 11/16/17  1528     History   Chief Complaint Chief Complaint  Patient presents with  . Near syncopal  . Abdominal Pain    HPI Jane Howard is a 62 y.o. female.  HPI Pt started having pain in the lower midline abdomen at noon.   The pain was sharp and it became severe.  She then started to feel lightheaded and felt like she was going to pass out.  No nausea or vomiting.  No diarrhea.  THe sx have eased off but the pain still comes and goes.  No urinary issues.  No fever.  No CP or SOB,. Past Medical History:  Diagnosis Date  . Anxiety   . Nephrolithiasis     Patient Active Problem List   Diagnosis Date Noted  . Vitamin D deficiency 12/01/2016  . Hypertriglyceridemia 12/01/2016  . Hyperlipemia 11/27/2016  . Obesity (BMI 30-39.9) 11/06/2015  . Gout 11/17/2013  . Essential hypertension, benign 11/17/2013  . GAD (generalized anxiety disorder) 11/17/2013    Past Surgical History:  Procedure Laterality Date  . ABDOMINAL HYSTERECTOMY    . FOOT SURGERY Bilateral   . NASAL SEPTUM SURGERY    . TONSILLECTOMY       OB History   None      Home Medications    Prior to Admission medications   Medication Sig Start Date End Date Taking? Authorizing Provider  allopurinol (ZYLOPRIM) 100 MG tablet TAKE 1 TABLET BY MOUTH EVERY DAY Patient taking differently: Take 100 mg by mouth daily.  11/15/17  Yes Hawks, Christy A, FNP  aspirin EC 81 MG tablet Take 81 mg by mouth daily.   Yes [provider]  atorvastatin (LIPITOR) 20 MG tablet TAKE 1 TABLET BY MOUTH EVERY DAY Patient taking differently: Take 20 mg by mouth daily at 6 PM.  11/15/17  Yes Hawks, Christy A, FNP  clonazePAM (KLONOPIN) 0.5 MG tablet Take 1 tablet (0.5 mg total) by mouth 2 (two) times daily as needed for anxiety. 11/27/16  Yes Hawks, Christy A, FNP  escitalopram (LEXAPRO) 10 MG tablet Take 1 tablet  (10 mg total) by mouth daily. Patient taking differently: Take 10 mg by mouth as needed.  02/19/17  Yes Hawks, Christy A, FNP  fluticasone (FLONASE) 50 MCG/ACT nasal spray Place 1 spray into both nostrils 2 (two) times daily as needed for allergies or rhinitis. 03/26/17  Yes Dettinger, Elige Radon, MD  lisinopril-hydrochlorothiazide (ZESTORETIC) 20-12.5 MG tablet Take 1 tablet by mouth daily. 11/27/16  Yes Hawks, Christy A, FNP  Multiple Vitamin (MULTI-DAY PO) Take 1 tablet by mouth daily. L-Lysine   Yes [provider]  azithromycin (ZITHROMAX) 250 MG tablet Take 2 the first day and then one each day after. Patient not taking: Reported on 11/16/2017 03/26/17   Dettinger, Elige Radon, MD  benzonatate (TESSALON) 200 MG capsule Take 1 capsule (200 mg total) by mouth 2 (two) times daily as needed for cough. Patient not taking: Reported on 11/16/2017 03/26/17   Dettinger, Elige Radon, MD  cephALEXin (KEFLEX) 500 MG capsule Take 1 capsule (500 mg total) by mouth 3 (three) times daily. 11/16/17   Linwood Dibbles, MD  ciprofloxacin (CIPRO) 500 MG tablet Take 1 tablet (500 mg total) by mouth 2 (two) times daily. Patient not taking: Reported on 11/16/2017 02/19/17   Bennie Pierini, FNP  fenofibrate (TRICOR) 145 MG tablet TAKE 1 TABLET BY MOUTH EVERY DAY  Patient not taking: Reported on 11/16/2017 11/15/17   Jannifer Rodney A, FNP  naproxen (NAPROSYN) 375 MG tablet Take 1 tablet (375 mg total) by mouth 2 (two) times daily with a meal for 14 days. As needed for pain 11/16/17 11/30/17  Linwood Dibbles, MD  phenazopyridine (PYRIDIUM) 200 MG tablet Take 1 tablet (200 mg total) by mouth 3 (three) times daily. 11/16/17   Linwood Dibbles, MD  Vitamin D, Ergocalciferol, (DRISDOL) 50000 units CAPS capsule Take 1 capsule (50,000 Units total) by mouth every 7 (seven) days. Patient not taking: Reported on 11/16/2017 12/01/16   Junie Spencer, FNP    Family History Family History  Problem Relation Age of Onset  . Heart disease Mother     . Cancer Father        prostate  . Heart disease Father     Social History Social History   Tobacco Use  . Smoking status: Never Smoker  . Smokeless tobacco: Never Used  Substance Use Topics  . Alcohol use: No  . Drug use: No     Allergies   Macrodantin [nitrofurantoin macrocrystal] and Vicodin [hydrocodone-acetaminophen]   Review of Systems Review of Systems  All other systems reviewed and are negative.    Physical Exam Updated Vital Signs BP (!) 146/66 (BP Location: Left Arm)   Pulse 89   Temp 98.7 F (37.1 C) (Oral)   Resp 20   Ht 1.676 m (5\' 6" )   Wt (!) 230.7 kg   SpO2 100%   BMI 82.09 kg/m   Physical Exam  Constitutional: She appears well-developed and well-nourished. No distress.  HENT:  Head: Normocephalic and atraumatic.  Right Ear: External ear normal.  Left Ear: External ear normal.  Eyes: Conjunctivae are normal. Right eye exhibits no discharge. Left eye exhibits no discharge. No scleral icterus.  Neck: Neck supple. No tracheal deviation present.  Cardiovascular: Normal rate, regular rhythm and intact distal pulses.  Pulmonary/Chest: Effort normal and breath sounds normal. No stridor. No respiratory distress. She has no wheezes. She has no rales.  Abdominal: Soft. Bowel sounds are normal. She exhibits no distension. There is no tenderness. There is no rebound and no guarding.  Musculoskeletal: She exhibits no edema or tenderness.  Neurological: She is alert. She has normal strength. No cranial nerve deficit (no facial droop, extraocular movements intact, no slurred speech) or sensory deficit. She exhibits normal muscle tone. She displays no seizure activity. Coordination normal.  Skin: Skin is warm and dry. No rash noted.  Psychiatric: She has a normal mood and affect.  Nursing note and vitals reviewed.    ED Treatments / Results  Labs (all labs ordered are listed, but only abnormal results are displayed) Labs Reviewed  COMPREHENSIVE  METABOLIC PANEL - Abnormal; Notable for the following components:      Result Value   Potassium 3.0 (*)    Glucose, Bld 114 (*)    Creatinine, Ser 1.07 (*)    GFR calc non Af Amer 55 (*)    All other components within normal limits  CBC WITH DIFFERENTIAL/PLATELET - Abnormal; Notable for the following components:   WBC 20.1 (*)    Neutro Abs 16.7 (*)    Monocytes Absolute 1.2 (*)    Abs Immature Granulocytes 0.14 (*)    All other components within normal limits  URINALYSIS, ROUTINE W REFLEX MICROSCOPIC - Abnormal; Notable for the following components:   APPearance HAZY (*)    Protein, ur 30 (*)    Leukocytes, UA  LARGE (*)    Bacteria, UA MANY (*)    All other components within normal limits  URINE CULTURE  LIPASE, BLOOD    EKG EKG Interpretation  Date/Time:  Tuesday November 16 2017 15:37:13 EDT Ventricular Rate:  96 PR Interval:    QRS Duration: 143 QT Interval:  411 QTC Calculation: 520 R Axis:   -51 Text Interpretation:  Sinus rhythm Left bundle branch block No significant change since last tracing Confirmed by Linwood Dibbles [phone]) on 11/16/2017 3:45:45 PM   Radiology No results found.  Procedures Procedures (including critical care time)  Medications Ordered in ED Medications  sodium chloride 0.9 % bolus 500 mL (0 mLs Intravenous Stopped 11/16/17 1825)    And  0.9 %  sodium chloride infusion ( Intravenous New Bag/Given 11/16/17 1613)  cefTRIAXone (ROCEPHIN) 1 g in sodium chloride 0.9 % 100 mL IVPB (1 g Intravenous New Bag/Given 11/16/17 1838)  ketorolac (TORADOL) 30 MG/ML injection 30 mg (30 mg Intravenous Given 11/16/17 1834)  potassium chloride SA (K-DUR,KLOR-CON) CR tablet 40 mEq (40 mEq Oral Given 11/16/17 1834)     Initial Impression / Assessment and Plan / ED Course  I have reviewed the triage vital signs and the nursing notes.  Pertinent labs & imaging results that were available during my care of the patient were reviewed by me and considered in my medical  decision making (see chart for details).   Laboratory test notable for leukocytosis.  Urinalysis is also consistent with urinary tract infection.  Mild hypokalemia noted.    I doubt diverticulitis considering the bacteria.  Patient is not having any severe pain.  She is not hypotensive or febrile and does not appear septic she has not had any episodes of vomiting.  I think is reasonable to start on a course of antibiotics.  I stressed the importance of her returning to the emergency room if she starts feeling worse or develops fevers.  I will have her follow-up with her doctor closely to make sure she is improving.   Final Clinical Impressions(s) / ED Diagnoses   Final diagnoses:  Acute cystitis without hematuria  Near syncope    ED Discharge Orders         Ordered    cephALEXin (KEFLEX) 500 MG capsule  3 times daily,   Status:  Discontinued     11/16/17 1825    naproxen (NAPROSYN) 375 MG tablet  2 times daily with meals,   Status:  Discontinued     11/16/17 1825    phenazopyridine (PYRIDIUM) 200 MG tablet  3 times daily,   Status:  Discontinued     11/16/17 1825    cephALEXin (KEFLEX) 500 MG capsule  3 times daily     11/16/17 1830    naproxen (NAPROSYN) 375 MG tablet  2 times daily with meals     11/16/17 1830    phenazopyridine (PYRIDIUM) 200 MG tablet  3 times daily     11/16/17 1830           Linwood Dibbles, MD 11/16/17 1925

## 2017-11-16 NOTE — ED Triage Notes (Signed)
Patient arrived via [facility] from work. EMS reports diaphoresis, near syncopy and bilateral lower abdominal pain Patient states "I almost passed out, I got really sweaty, and my stomach was hurting."  Patient is A&O X 4, reports her abdominal pain is intermittent. Reports pain of 0 on a 0-10 pain for now

## 2017-11-17 LAB — URINE CULTURE

## 2017-11-18 ENCOUNTER — Ambulatory Visit: Payer: PRIVATE HEALTH INSURANCE | Admitting: Family

## 2017-11-18 ENCOUNTER — Encounter: Payer: Self-pay | Admitting: Family

## 2017-11-18 VITALS — BP 126/72 | HR 85 | Temp 97.0°F | Ht 66.0 in | Wt 206.2 lb

## 2017-11-18 DIAGNOSIS — I10 Essential (primary) hypertension: Secondary | ICD-10-CM

## 2017-11-18 DIAGNOSIS — M1A9XX0 Chronic gout, unspecified, without tophus (tophi): Secondary | ICD-10-CM | POA: Diagnosis not present

## 2017-11-18 DIAGNOSIS — Z09 Encounter for follow-up examination after completed treatment for conditions other than malignant neoplasm: Secondary | ICD-10-CM | POA: Diagnosis not present

## 2017-11-18 DIAGNOSIS — N3001 Acute cystitis with hematuria: Secondary | ICD-10-CM

## 2017-11-18 MED ORDER — ALLOPURINOL 100 MG PO TABS: 100.0000 mg | ORAL_TABLET | Freq: Every day | ORAL | 1 refills | Status: DC

## 2017-11-18 MED ORDER — LISINOPRIL-HYDROCHLOROTHIAZIDE 20-12.5 MG PO TABS: 1.0000 | ORAL_TABLET | Freq: Every day | ORAL | 3 refills | Status: DC

## 2017-11-18 NOTE — Progress Notes (Signed)
   Subjective:    Patient ID: Jane Howard, female    DOB: 05-20-1955, 62 y.o.   MRN: [id number]  Chief Complaint  Patient presents with  . lower abdominal pain   Pt presents to the office today recheck urine. Pt went to the ED 11/16/17 and was diagnosed with a UTI and started Keflex. She states her abdomen pain is improved, but still there.  Urinary Tract Infection   This is a new problem. The current episode started in the past 7 days. The problem occurs every urination. The problem has been gradually improving. The quality of the pain is described as aching. The pain is at a severity of 3/10. There has been no fever. Associated symptoms include nausea. Pertinent negatives include no frequency, hesitancy, urgency or vomiting. She has tried antibiotics for the symptoms. The treatment provided mild relief.  Hypertension  This is a chronic problem. The current episode started more than 1 year ago. The problem has been resolved since onset. The problem is controlled. Pertinent negatives include no peripheral edema or shortness of breath. The current treatment provides moderate improvement. There is no history of kidney disease or CAD/MI.  Gout PT states it has been years since her last gout attack since   Hospital notes reviewed  Review of Systems  Respiratory: Negative for shortness of breath.   Gastrointestinal: Positive for nausea. Negative for vomiting.  Genitourinary: Negative for frequency, hesitancy and urgency.  All other systems reviewed and are negative.      Objective:   Physical Exam  Constitutional: She is oriented to person, place, and time. She appears well-developed and well-nourished. No distress.  HENT:  Head: Normocephalic.  Eyes: Pupils are equal, round, and reactive to light.  Neck: Normal range of motion. Neck supple. No thyromegaly present.  Cardiovascular: Normal rate, regular rhythm, normal heart sounds and intact distal pulses.  No murmur  heard. Pulmonary/Chest: Effort normal and breath sounds normal. No respiratory distress. She has no wheezes.  Abdominal: Soft. Bowel sounds are normal. She exhibits no distension. There is tenderness (mild lower ).  Musculoskeletal: Normal range of motion. She exhibits no edema or tenderness.  Neurological: She is alert and oriented to person, place, and time. She has normal reflexes. No cranial nerve deficit.  Skin: Skin is warm and dry.  Psychiatric: She has a normal mood and affect. Her behavior is normal. Judgment and thought content normal.  Vitals reviewed.     BP 126/72   Pulse 85   Temp (!) 97 F (36.1 C) (Oral)   Ht 5\' 6"  (1.676 m)   Wt 206 lb 3.2 oz (93.5 kg)   BMI 33.28 kg/m      Assessment & Plan:  [name] comes in today with chief complaint of lower abdominal pain   Diagnosis and orders addressed:  1. Acute cystitis with hematuria Force fluids Continue Keflex RTO prn Culture pending - Urine Culture  2. Hospital discharge follow-up  3. Chronic gout without tophus, unspecified cause, unspecified site - allopurinol (ZYLOPRIM) 100 MG tablet; Take 1 tablet (100 mg total) by mouth daily.  Dispense: 90 tablet; Refill: 1  4. Essential hypertension, benign -Dash diet information given -Exercise encouraged - Stress Management  -Continue current meds - lisinopril-hydrochlorothiazide (ZESTORETIC) 20-12.5 MG tablet; Take 1 tablet by mouth daily.  Dispense: 90 tablet; Refill: 3   Jannifer Rodney, FNP

## 2017-11-18 NOTE — Patient Instructions (Signed)

## 2017-11-19 LAB — URINE CULTURE: ORGANISM ID, BACTERIA: NO GROWTH

## 2017-12-27 ENCOUNTER — Encounter: Payer: Self-pay | Admitting: Nurse Practitioner

## 2017-12-27 ENCOUNTER — Ambulatory Visit: Payer: PRIVATE HEALTH INSURANCE | Admitting: Nurse Practitioner

## 2017-12-27 VITALS — BP 128/78 | HR 93 | Temp 97.6°F | Ht 66.0 in | Wt 208.0 lb

## 2017-12-27 DIAGNOSIS — Z23 Encounter for immunization: Secondary | ICD-10-CM | POA: Diagnosis not present

## 2017-12-27 DIAGNOSIS — R1011 Right upper quadrant pain: Secondary | ICD-10-CM

## 2017-12-27 MED ORDER — OMEPRAZOLE 40 MG PO CPDR: 40.0000 mg | DELAYED_RELEASE_CAPSULE | Freq: Every day | ORAL | 3 refills | Status: DC

## 2017-12-27 NOTE — Progress Notes (Signed)
   Subjective:    Patient ID: Jane Howard, female    DOB: 1955/05/19, 62 y.o.   MRN: [id number]   Chief Complaint: Abdominal Pain   HPI Patient come sin today c/o mid epigastric pain that started a couple of weeks ago. Seems to be worse at night. Pain starts midepigastric and radiates around. Resolves on its own. Was really bad over the weekend.  Review of Systems  Constitutional: Negative.   HENT: Negative.   Respiratory: Negative.   Cardiovascular: Negative.   Gastrointestinal: Positive for abdominal pain. Negative for constipation, diarrhea, nausea and vomiting.  Genitourinary: Negative.   Neurological: Negative.   Psychiatric/Behavioral: Negative.   All other systems reviewed and are negative.      Objective:   Physical Exam  Constitutional: She appears well-developed and well-nourished.  Cardiovascular: Normal rate.  Pulmonary/Chest: Effort normal.  Abdominal: Soft. Normal appearance and bowel sounds are normal. There is no hepatosplenomegaly, splenomegaly or hepatomegaly. There is tenderness in the right upper quadrant and epigastric area. There is positive Murphy's sign. There is no rigidity, no rebound, no guarding, no CVA tenderness and no tenderness at McBurney's point. No hernia. Hernia confirmed negative in the ventral area.  Skin: Skin is warm and dry.  Nursing note and vitals reviewed.   BP 128/78   Pulse 93   Temp 97.6 F (36.4 C) (Oral)   Ht 5\' 6"  (1.676 m)   Wt 208 lb (94.3 kg)   BMI 33.57 kg/m        Assessment & Plan:  Jane BroomsJeanette D Cominsky in today with chief complaint of Abdominal Pain   1. Right upper quadrant pain Avoid spicy foods Do not eat 2 hours prior to bedtime - US Abdomen Limited RUQ; Future - omeprazole (PRILOSEC) 40 MG capsule; Take 1 capsule (40 mg total) by mouth daily.  Dispense: 30 capsule; Refill: 3  Mary-Margaret Daphine DeutscherMartin, FNP

## 2017-12-27 NOTE — Patient Instructions (Signed)
Cholecystitis °Cholecystitis is swelling and irritation (inflammation) of the gallbladder. The gallbladder is an organ that is shaped like a pear. It is under the liver on the right side of the body. This condition is often caused by gallstones. You doctor may do tests to see how your gallbladder works. These tests may include: °· Imaging tests, such as: °? An ultrasound. °? MRI. °· Tests that check how your liver works. ° °This condition needs treatment. °Follow these instructions at home: °Home care will depend on your treatment. In general: °· Take over-the-counter and prescription medicines only as told by your doctor. °· If you were prescribed an antibiotic medicine, take it as told by your doctor. Do not stop taking the antibiotic even if you start to feel better. °· Follow instructions from your doctor about what to eat or drink. When you are allowed to eat, avoid eating or drinking anything that causes your symptoms to start. °· Keep all follow-up visits as told by your doctor. This is important. ° °Contact a doctor if: °· You have pain and your medicine does not help. °· You have a fever. °Get help right away if: °· Your pain moves to: °? Another part of your belly (abdomen). °? Your back. °· Your symptoms do not go away. °· You have new symptoms. °This information is not intended to replace advice given to you by your health care provider. Make sure you discuss any questions you have with your health care provider. °Document Released: 01/15/2011 Document Revised: 07/04/2015 Document Reviewed: 05/09/2014 °Elsevier Interactive Patient Education © 2018 Elsevier Inc. ° °

## 2017-12-29 ENCOUNTER — Other Ambulatory Visit: Payer: Self-pay | Admitting: Family Medicine

## 2017-12-29 DIAGNOSIS — J0100 Acute maxillary sinusitis, unspecified: Secondary | ICD-10-CM

## 2018-01-05 ENCOUNTER — Ambulatory Visit (HOSPITAL_COMMUNITY)
Admission: RE | Admit: 2018-01-05 | Discharge: 2018-01-05 | Disposition: A | Discharge status: Home / Self Care | Payer: No Typology Code available for payment source | Source: Ambulatory Visit | Attending: Nurse Practitioner | Admitting: Nurse Practitioner

## 2018-01-05 DIAGNOSIS — R1011 Right upper quadrant pain: Secondary | ICD-10-CM | POA: Insufficient documentation

## 2018-01-05 DIAGNOSIS — R932 Abnormal findings on diagnostic imaging of liver and biliary tract: Secondary | ICD-10-CM | POA: Diagnosis not present

## 2018-01-07 ENCOUNTER — Telehealth: Payer: Self-pay | Admitting: Family

## 2018-01-07 NOTE — Telephone Encounter (Signed)
Per patient's request left a detailed voicemail regarding US abdomen results.  Asked her to call back to let us know if she is doing better.

## 2018-01-20 ENCOUNTER — Ambulatory Visit: Payer: PRIVATE HEALTH INSURANCE | Admitting: Nurse Practitioner

## 2018-01-20 ENCOUNTER — Encounter: Payer: Self-pay | Admitting: Nurse Practitioner

## 2018-01-20 VITALS — BP 121/66 | HR 80 | Temp 97.3°F | Ht 66.0 in | Wt 206.2 lb

## 2018-01-20 DIAGNOSIS — N3000 Acute cystitis without hematuria: Secondary | ICD-10-CM

## 2018-01-20 DIAGNOSIS — R300 Dysuria: Secondary | ICD-10-CM

## 2018-01-20 MED ORDER — CIPROFLOXACIN HCL 500 MG PO TABS: 500.0000 mg | ORAL_TABLET | Freq: Two times a day (BID) | ORAL | 0 refills | Status: DC

## 2018-01-20 NOTE — Progress Notes (Signed)
   Subjective:    Patient ID: Jane Howard, female    DOB: July 24, 1955, 62 y.o.   MRN: [id number]   Chief Complaint: Urinary Tract Infection   HPI Patient in today c/o frequency and urgency. Started yesterday and has worsened throughout today.     Review of Systems  Constitutional: Negative for chills and fever.  HENT: Negative.   Respiratory: Negative.   Cardiovascular: Negative.   Gastrointestinal: Negative for abdominal pain.  Genitourinary: Positive for frequency and urgency.  Neurological: Negative.   Psychiatric/Behavioral: Negative.   All other systems reviewed and are negative.      Objective:   Physical Exam Constitutional:      Appearance: Normal appearance.  HENT:     Head: Normocephalic.  Cardiovascular:     Rate and Rhythm: Normal rate and regular rhythm.  Pulmonary:     Effort: Pulmonary effort is normal.     Breath sounds: Normal breath sounds.  Abdominal:     General: Abdomen is flat. Bowel sounds are normal.     Palpations: Abdomen is soft.     Tenderness: There is no abdominal tenderness.  Skin:    General: Skin is warm and dry.  Neurological:     General: No focal deficit present.     Mental Status: She is alert and oriented to person, place, and time.  Psychiatric:        Mood and Affect: Mood normal.        Behavior: Behavior normal.   BP 121/66   Pulse 80   Temp (!) 97.3 F (36.3 C) (Oral)   Ht 5\' 6"  (1.676 m)   Wt 206 lb 3.2 oz (93.5 kg)   BMI 33.28 kg/m   UA- Poisitive nitrites     Assessment & Plan:  Jane Howard in today with chief complaint of Urinary Tract Infection (painful urination and frequency, started on Weds)   1. Dysuria - Urinalysis  2. Acute cystitis without hematuria Take medication as prescribe Cotton underwear Take shower not bath Cranberry juice, yogurt Force fluids AZO over the counter X2 days Culture pending RTO prn  - Urine Culture  Meds ordered this encounter  Medications  .  ciprofloxacin (CIPRO) 500 MG tablet    Sig: Take 1 tablet (500 mg total) by mouth 2 (two) times daily.    Dispense:  10 tablet    Refill:  0    Order Specific Question:   Supervising Provider    Answer:   Johna SheriffVINCENT, CAROL L [[id number]]   Jane Daphine DeutscherMartin, FNP

## 2018-01-21 LAB — URINALYSIS
Bilirubin, UA: NEGATIVE
GLUCOSE, UA: NEGATIVE
Ketones, UA: NEGATIVE
Nitrite, UA: POSITIVE — AB
PROTEIN UA: NEGATIVE
Specific Gravity, UA: 1.01 (ref 1.005–1.030)
Urobilinogen, Ur: 0.2 mg/dL (ref 0.2–1.0)
pH, UA: 6 (ref 5.0–7.5)

## 2018-01-25 ENCOUNTER — Telehealth: Payer: Self-pay | Admitting: Family

## 2018-01-26 ENCOUNTER — Other Ambulatory Visit: Payer: Self-pay | Admitting: Nurse Practitioner

## 2018-01-26 LAB — URINE CULTURE

## 2018-01-27 ENCOUNTER — Other Ambulatory Visit: Payer: Self-pay | Admitting: Nurse Practitioner

## 2018-01-27 MED ORDER — CIPROFLOXACIN HCL 500 MG PO TABS: 500.0000 mg | ORAL_TABLET | Freq: Two times a day (BID) | ORAL | 0 refills | Status: DC

## 2018-01-27 NOTE — Telephone Encounter (Signed)
ntbs if needs another antibiotic

## 2018-01-27 NOTE — Telephone Encounter (Signed)
Last seen 01/20/18

## 2018-01-27 NOTE — Telephone Encounter (Signed)
Culture grew ecoli- cipro should have taken care of it. Will send I 5 more days of meds

## 2018-01-31 NOTE — Telephone Encounter (Signed)
Attempted to call pt without return call in over 3 days, will close call. 

## 2018-02-12 ENCOUNTER — Other Ambulatory Visit: Payer: Self-pay | Admitting: Family

## 2018-02-12 DIAGNOSIS — F411 Generalized anxiety disorder: Secondary | ICD-10-CM

## 2018-02-21 ENCOUNTER — Telehealth: Payer: 59 | Admitting: Family

## 2018-02-21 DIAGNOSIS — L03116 Cellulitis of left lower limb: Secondary | ICD-10-CM

## 2018-02-21 NOTE — Progress Notes (Signed)
Based on what you shared with me it looks like you have a serious condition that should be evaluated in a face to face office visit.  NOTE: If you entered your credit card information for this eVisit, you will not be charged. You may see a "hold" on your card for the $30 but that hold will drop off and you will not have a charge processed.  If you are having a true medical emergency please call 911.  If you need an urgent face to face visit, [place] has four urgent care centers for your convenience.  If you need care fast and have a high deductible or no insurance consider:   [email or web address] to reserve your spot online an avoid wait times  [facility] [address], [address] [place], [place] [address] 8 am to 8 pm Monday-Friday 10 am to 4 pm Saturday-Sunday *Across the street from [employer]  [facility]  [address] [place], [address] 8 am to 5 pm Monday-Friday * In the [facility]   The following sites will take your  insurance:  . [facility]  [phone] Get Driving Directions Find a Provider at this Location  [address] [place], [place] [address] . 10 am to 8 pm Monday-Friday . 12 pm to 8 pm Saturday-Sunday   . [facility]  [phone] Get Driving Directions Find a Provider at this Location  [address], [address] [place], [place] [address] . 8 am to 8 pm Monday-Friday . 9 am to 6 pm Saturday . 11 am to 6 pm Sunday   .  [facility] [place]  [phone] Get Driving Directions  [address] [place], [place] [address] . 8 am to 8 pm Monday-Friday . 8 am to 4 pm Saturday-Sunday   Your e-visit answers were reviewed by a board certified advanced clinical practitioner to complete your personal care plan.  Thank you for using e-Visits.  

## 2018-03-03 ENCOUNTER — Encounter: Payer: Self-pay | Admitting: Nurse Practitioner

## 2018-03-03 ENCOUNTER — Ambulatory Visit: Payer: PRIVATE HEALTH INSURANCE | Admitting: Nurse Practitioner

## 2018-03-03 VITALS — BP 148/68 | HR 81 | Temp 96.8°F | Ht 66.0 in | Wt 206.0 lb

## 2018-03-03 DIAGNOSIS — R609 Edema, unspecified: Secondary | ICD-10-CM

## 2018-03-03 DIAGNOSIS — I10 Essential (primary) hypertension: Secondary | ICD-10-CM | POA: Diagnosis not present

## 2018-03-03 MED ORDER — LISINOPRIL 40 MG PO TABS: 40.0000 mg | ORAL_TABLET | Freq: Every day | ORAL | 3 refills | Status: DC

## 2018-03-03 MED ORDER — TENDERWRAP UNNA BOOT BANDAGE EX MISC: 1.0000 | CUTANEOUS | 0 refills | Status: AC

## 2018-03-03 MED ORDER — FUROSEMIDE 20 MG PO TABS: 20.0000 mg | ORAL_TABLET | Freq: Every day | ORAL | 3 refills | Status: DC

## 2018-03-03 NOTE — Progress Notes (Addendum)
   Subjective:    Patient ID: Jane Howard, female    DOB: Jun 01, 1955, 63 y.o.   MRN: [id number]   Chief Complaint: left leg swelling  HPI Patient comes in today c/o left leg red and swollen. She went to urgent care on 02/21/18 and they dx her witj cellulitis. She was given bactrim to take for 10 days . The redness is better but the swelling is no better.   Review of Systems  Constitutional: Negative.   HENT: Negative.   Respiratory: Negative.   Cardiovascular: Positive for leg swelling.  Genitourinary: Negative.   Neurological: Negative.   Psychiatric/Behavioral: Negative.   All other systems reviewed and are negative.      Objective:   Physical Exam Constitutional:      Appearance: She is obese.  Cardiovascular:     Rate and Rhythm: Normal rate and regular rhythm.     Pulses: Normal pulses.     Heart sounds: Normal heart sounds.  Pulmonary:     Effort: Pulmonary effort is normal.     Breath sounds: Normal breath sounds.  Musculoskeletal:     Right lower leg: No edema.     Left lower leg: Edema (2+ ) present.  Skin:    General: Skin is warm and dry.     Comments: Mild leftlower ext skin discoloration- cool to touch  Neurological:     General: No focal deficit present.     Mental Status: She is alert and oriented to person, place, and time.    BP (!) 148/68   Pulse 81   Temp (!) 96.8 F (36 C) (Oral)   Ht 5\' 6"  (1.676 m)   Wt 206 lb (93.4 kg)   BMI 33.25 kg/m   Unna boot applied to left lower ext      Assessment & Plan:  Jane Howard in today with chief complaint of Cellulitis in left leg and foot (Seen at Urgent Care on 1/13)   1. Essential hypertension, benign Stop lisinopril HCTS - lisinopril (PRINIVIL,ZESTRIL) 40 MG tablet; Take 1 tablet (40 mg total) by mouth daily.  Dispense: 90 tablet; Refill: 3  2. Peripheral edema Elevate leg when sitting Rest  Over weekend - furosemide (LASIX) 20 MG tablet; Take 1 tablet (20 mg total) by mouth daily.   Dispense: 30 tablet; Refill: 3 RTO Monday for rcheck  Mary-Margaret Daphine Deutscher, FNP

## 2018-03-03 NOTE — Patient Instructions (Signed)
Unna Boot Care An Unna boot is a type of bandage (dressing) for the foot and leg. The dressing is a gauze wrap that is soaked with a type of medicine called zinc oxide. The gauze may also include other lotions and medicines that help in wound healing, such as calamine. An Unna boot may be used to treat:  Open sores (ulcers) on the foot, heel, or leg.  Swelling from disorders that affect the veins or lymphatic system (lymphedema).  Skin conditions such as chronic inflammation caused by poor blood flow (stasis dermatitis). The dressing is applied by a health care provider. The gauze is wrapped around your lower extremity in several layers, usually starting at the toes and going upward to the knee. A dry outer wrap goes over the medicated wrap for support and compression.  Before applying the Unna boot, your health care provider will clean your leg and foot and may apply an antibiotic ointment. You may be asked to raise (elevate) your leg for a while to reduce swelling before the boot is applied. The boot will dry and harden after it is applied. The boot may need to be changed or replaced about twice a week. Follow these instructions at home: Boot care  Wear the Unna boot as told by your health care provider.  You may need to wear a slipper or shoe over the boot that is one or two sizes larger than normal.  Check the skin around the boot every day. Tell your health care provider about any concerns.  Do not stick anything inside the boot to scratch your skin. Doing that increases your risk of infection.  Keep your Unna boot clean and dry.  Check every day for signs of infection. Check for: ? Redness, swelling, or pain in your foot or toes. ? Fluid or blood coming from the boot. ? Pus or a bad smell coming from the boot.  Remove the boot and call your health care provider if you have signs of poor blood flow, such as: ? Your toes tingle or become numb. ? Your toes turn cold or turn blue or  pale. ? Your toes are more swollen or painful. ? You are unable to move your toes. Activity  You may walk with the boot once it has dried. Ask your health care provider how much walking is safe for you.  Avoid sitting for a long time without moving. Get up to take short walks as told by your health care provider. This is important to improve blood flow. Bathing  Do not take baths, swim, or use a hot tub until your health care provider approves. Ask your health care provider if you may take showers.  If your health care provider approves a bath or a shower, do not let the Unna boot get wet. ? If you take a shower, cover the boot with a watertight covering. ? If you take a bath, keep your leg with the boot out of the tub. General instructions  Keep your leg elevated above the level of your heart while you are sitting or lying down. This will decrease swelling.  Do not sit with your knee bent for long periods of time.  Take over-the-counter and prescription medicines only as told by your health care provider.  Do not use any products that contain nicotine or tobacco, such as cigarettes, e-cigarettes, and chewing tobacco. These can delay healing. If you need help quitting, ask your health care provider.  Keep all follow-up visits as   told by your health care provider. This is important. Contact a health care provider if:  Your skin feels itchy inside the boot.  You have a burning sensation, a rash, or itchy, red, swollen areas of skin (hives) in the boot area.  You have a fever or chills.  You have any signs of infection, such as: ? New redness, swelling, or pain. ? More fluid or blood coming from the boot. ? Pus or a bad smell coming from the boot.  You have increased numbness or pain in your foot or toes.  You have any changes in skin color on your foot or toes, such as the skin turning blue or pale or developing patchy areas with spots.  Your boot has been damaged or feels  like it is no longer fitting properly. Summary  An Unna boot is a type of bandage (dressing) system for the foot and leg.  The dressing is a gauze wrap that is soaked with a type of medicine (zinc oxide) to treat foot, heel, or leg ulcers, swelling from disorders that affect the veins or lymphatic system (lymphedema), and skin conditions caused by poor blood flow (stasis dermatitis).  This dressing is applied by a health care provider. After it is applied, the boot will dry and harden.  The boot may need to be changed or replaced about twice a week.  Let your health care provider know if you have any signs of poor blood flow or infection. This information is not intended to replace advice given to you by your health care provider. Make sure you discuss any questions you have with your health care provider. Document Released: 10/06/2017 Document Revised: 10/06/2017 Document Reviewed: 10/06/2017 Elsevier Interactive Patient Education  2019 Elsevier Inc.  

## 2018-03-07 ENCOUNTER — Ambulatory Visit: Payer: 59 | Admitting: Nurse Practitioner

## 2018-03-07 ENCOUNTER — Encounter: Payer: Self-pay | Admitting: Nurse Practitioner

## 2018-03-07 VITALS — BP 118/74 | HR 92 | Temp 97.7°F | Ht 66.0 in | Wt 204.0 lb

## 2018-03-07 DIAGNOSIS — L97521 Non-pressure chronic ulcer of other part of left foot limited to breakdown of skin: Secondary | ICD-10-CM | POA: Diagnosis not present

## 2018-03-07 DIAGNOSIS — I83025 Varicose veins of left lower extremity with ulcer other part of foot: Secondary | ICD-10-CM | POA: Diagnosis not present

## 2018-03-07 NOTE — Progress Notes (Signed)
   Subjective:    Patient ID: Jane Howard, female    DOB: Jan 09, 1956, 63 y.o.   MRN: [id number]   Chief Complaint: Recheck unna boot   HPI Patient was seen Friday with venous stasis of left lower leg and foot. E changed her blood pressure meds to stop HCTZ and put her on lasix 20mg . We wrapped hr leg in unna boot and told her to keep it elevated. She is back to day for recheck.   Review of Systems  HENT: Negative.   Respiratory: Negative.   Cardiovascular: Positive for leg swelling.  Gastrointestinal: Negative.   Genitourinary: Negative.   Psychiatric/Behavioral: Negative.   All other systems reviewed and are negative.      Objective:   Physical Exam Vitals signs and nursing note reviewed.  Constitutional:      General: She is not in acute distress.    Appearance: She is obese.  Cardiovascular:     Rate and Rhythm: Normal rate and regular rhythm.     Pulses: Normal pulses.     Heart sounds: Normal heart sounds.  Pulmonary:     Effort: Pulmonary effort is normal.     Breath sounds: Normal breath sounds.  Musculoskeletal:     Right lower leg: Edema (1+in foot) present.  Skin:    General: Skin is warm and dry.  Neurological:     General: No focal deficit present.     Mental Status: She is alert and oriented to person, place, and time.  Psychiatric:        Mood and Affect: Mood normal.        Behavior: Behavior normal.    BP 118/74   Pulse 92   Temp 97.7 F (36.5 C) (Oral)   Ht 5\' 6"  (1.676 m)   Wt 204 lb (92.5 kg)   BMI 32.93 kg/m         Assessment & Plan:  Jane Howard in today with chief complaint of Recheck unna boot   1. Venous stasis ulcer of other part of left foot limited to breakdown of skin with varicose veins (HCC) Continue lasix as rx Keep foot elevated when sitting RTO prn  Mary-Margaret Daphine Deutscher, FNP

## 2018-03-15 ENCOUNTER — Other Ambulatory Visit: Payer: Self-pay | Admitting: Family

## 2018-03-15 DIAGNOSIS — F411 Generalized anxiety disorder: Secondary | ICD-10-CM

## 2018-03-16 ENCOUNTER — Other Ambulatory Visit: Payer: Self-pay | Admitting: Family

## 2018-03-16 DIAGNOSIS — F411 Generalized anxiety disorder: Secondary | ICD-10-CM

## 2018-03-20 ENCOUNTER — Other Ambulatory Visit: Payer: Self-pay | Admitting: Nurse Practitioner

## 2018-03-20 DIAGNOSIS — R1011 Right upper quadrant pain: Secondary | ICD-10-CM

## 2018-03-30 ENCOUNTER — Other Ambulatory Visit: Payer: Self-pay | Admitting: Family

## 2018-03-30 DIAGNOSIS — F411 Generalized anxiety disorder: Secondary | ICD-10-CM

## 2018-03-31 ENCOUNTER — Other Ambulatory Visit: Payer: Self-pay | Admitting: Family

## 2018-03-31 DIAGNOSIS — F411 Generalized anxiety disorder: Secondary | ICD-10-CM

## 2018-05-03 ENCOUNTER — Other Ambulatory Visit: Payer: Self-pay | Admitting: Family

## 2018-05-03 DIAGNOSIS — F411 Generalized anxiety disorder: Secondary | ICD-10-CM

## 2018-05-26 ENCOUNTER — Other Ambulatory Visit: Payer: Self-pay | Admitting: Nurse Practitioner

## 2018-05-26 DIAGNOSIS — R609 Edema, unspecified: Secondary | ICD-10-CM

## 2018-05-28 ENCOUNTER — Other Ambulatory Visit: Payer: Self-pay | Admitting: Family

## 2018-05-28 DIAGNOSIS — M1A9XX0 Chronic gout, unspecified, without tophus (tophi): Secondary | ICD-10-CM

## 2018-07-02 ENCOUNTER — Other Ambulatory Visit: Payer: Self-pay | Admitting: Family Medicine

## 2018-07-02 DIAGNOSIS — J0100 Acute maxillary sinusitis, unspecified: Secondary | ICD-10-CM

## 2018-08-11 DIAGNOSIS — L819 Disorder of pigmentation, unspecified: Secondary | ICD-10-CM | POA: Diagnosis not present

## 2018-08-11 DIAGNOSIS — D485 Neoplasm of uncertain behavior of skin: Secondary | ICD-10-CM | POA: Diagnosis not present

## 2018-08-11 DIAGNOSIS — L989 Disorder of the skin and subcutaneous tissue, unspecified: Secondary | ICD-10-CM | POA: Diagnosis not present

## 2018-08-19 ENCOUNTER — Other Ambulatory Visit: Payer: Self-pay | Admitting: Nurse Practitioner

## 2018-08-19 DIAGNOSIS — R609 Edema, unspecified: Secondary | ICD-10-CM

## 2018-11-14 ENCOUNTER — Other Ambulatory Visit: Payer: Self-pay

## 2018-11-15 ENCOUNTER — Encounter: Payer: Self-pay | Admitting: Nurse Practitioner

## 2018-11-15 ENCOUNTER — Ambulatory Visit: Payer: 59 | Admitting: Nurse Practitioner

## 2018-11-15 VITALS — BP 118/71 | HR 78 | Temp 97.5°F | Resp 16 | Ht 66.0 in | Wt 208.0 lb

## 2018-11-15 DIAGNOSIS — E559 Vitamin D deficiency, unspecified: Secondary | ICD-10-CM | POA: Diagnosis not present

## 2018-11-15 DIAGNOSIS — E782 Mixed hyperlipidemia: Secondary | ICD-10-CM | POA: Diagnosis not present

## 2018-11-15 DIAGNOSIS — F411 Generalized anxiety disorder: Secondary | ICD-10-CM

## 2018-11-15 DIAGNOSIS — E669 Obesity, unspecified: Secondary | ICD-10-CM

## 2018-11-15 DIAGNOSIS — Z23 Encounter for immunization: Secondary | ICD-10-CM

## 2018-11-15 DIAGNOSIS — R1011 Right upper quadrant pain: Secondary | ICD-10-CM

## 2018-11-15 DIAGNOSIS — R609 Edema, unspecified: Secondary | ICD-10-CM

## 2018-11-15 DIAGNOSIS — I10 Essential (primary) hypertension: Secondary | ICD-10-CM | POA: Diagnosis not present

## 2018-11-15 DIAGNOSIS — M1A9XX0 Chronic gout, unspecified, without tophus (tophi): Secondary | ICD-10-CM

## 2018-11-15 MED ORDER — CLONAZEPAM 0.5 MG PO TABS: 0.5000 mg | ORAL_TABLET | Freq: Two times a day (BID) | ORAL | 5 refills | Status: DC | PRN

## 2018-11-15 MED ORDER — ATORVASTATIN CALCIUM 20 MG PO TABS: 20.0000 mg | ORAL_TABLET | Freq: Every day | ORAL | 0 refills | Status: DC

## 2018-11-15 MED ORDER — OMEPRAZOLE 40 MG PO CPDR: DELAYED_RELEASE_CAPSULE | ORAL | 1 refills | Status: DC

## 2018-11-15 MED ORDER — FUROSEMIDE 20 MG PO TABS: 20.0000 mg | ORAL_TABLET | Freq: Every day | ORAL | 1 refills | Status: DC

## 2018-11-15 MED ORDER — ALLOPURINOL 100 MG PO TABS: 100.0000 mg | ORAL_TABLET | Freq: Every day | ORAL | 1 refills | Status: DC

## 2018-11-15 MED ORDER — LISINOPRIL 40 MG PO TABS: 40.0000 mg | ORAL_TABLET | Freq: Every day | ORAL | 3 refills | Status: DC

## 2018-11-15 NOTE — Addendum Note (Signed)
Addended by: Chevis Pretty on: 11/15/2018 04:02 PM   Modules accepted: Orders

## 2018-11-15 NOTE — Progress Notes (Signed)
Subjective:    Patient ID: Jane Howard, female    DOB: 11-10-1955, 63 y.o.   MRN: [id number]   Chief Complaint: Medical Management of Chronic Issues    HPI:  1. Essential hypertension, benign No c/o chest pain, sob or headache. Does not check blood pressure at home. BP Readings from Last 3 Encounters:  11/15/18 118/71  03/07/18 118/74  03/03/18 (!) 148/68     2. Hyperlipidemia, unspecified hyperlipidemia type Tries to watch diet. Does no exercise. Is on lipitor 25m daily. Lab Results  Component Value Date   CHOL 263 (H) 11/27/2016   HDL 44 11/27/2016   LDLCALC Comment 11/27/2016   TRIG 423 (H) 11/27/2016   CHOLHDL 6.0 (H) 11/27/2016     3. GAD (generalized anxiety disorder) Is on klonopin but very rarely takes- has not had rx refill since 2018. GAD 7 : Generalized Anxiety Score 11/15/2018  Nervous, Anxious, on Edge 0  Control/stop worrying 0  Worry too much - different things 0  Trouble relaxing 0  Restless 0  Easily annoyed or irritable 0  Afraid - awful might happen 0  Total GAD 7 Score 0  Anxiety Difficulty Not difficult at all      4. Vitamin D deficiency Daily vitamin d supplement when she remembers to take.  5. Chronic gout without tophus, unspecified cause, unspecified site No recent gout flare ups  6. GERD Is on omeprazole daily and is doing well.  6. Obesity (BMI 30-39.9) No recent weight changes Wt Readings from Last 3 Encounters:  11/15/18 208 lb (94.3 kg)  03/07/18 204 lb (92.5 kg)  03/03/18 206 lb (93.4 kg)   BMI Readings from Last 3 Encounters:  11/15/18 33.57 kg/m  03/07/18 32.93 kg/m  03/03/18 33.25 kg/m       Outpatient Encounter Medications as of 11/15/2018  Medication Sig  . allopurinol (ZYLOPRIM) 100 MG tablet TAKE 1 TABLET BY MOUTH EVERY DAY  . aspirin EC 81 MG tablet Take 81 mg by mouth daily.  .Marland Kitchenatorvastatin (LIPITOR) 20 MG tablet TAKE 1 TABLET BY MOUTH EVERY DAY (Patient taking differently: Take 20 mg by  mouth daily at 6 PM. )  . clonazePAM (KLONOPIN) 0.5 MG tablet Take 1 tablet (0.5 mg total) by mouth 2 (two) times daily as needed for anxiety.  .Marland Kitchenescitalopram (LEXAPRO) 10 MG tablet TAKE 1 TABLET (10 MG TOTAL) BY MOUTH DAILY. (NEEDS TO BE SEEN BEFORE NEXT REFILL)  . fluticasone (FLONASE) 50 MCG/ACT nasal spray PLACE 1 SPRAY INTO BOTH NOSTRILS 2 (TWO) TIMES DAILY AS NEEDED FOR ALLERGIES OR RHINITIS.  . furosemide (LASIX) 20 MG tablet TAKE 1 TABLET BY MOUTH EVERY DAY  . lisinopril (PRINIVIL,ZESTRIL) 40 MG tablet Take 1 tablet (40 mg total) by mouth daily.  . Multiple Vitamin (MULTI-DAY PO) Take 1 tablet by mouth daily. L-Lysine  . omeprazole (PRILOSEC) 40 MG capsule TAKE 1 CAPSULE BY MOUTH EVERY DAY     Past Surgical History:  Procedure Laterality Date  . ABDOMINAL HYSTERECTOMY    . FOOT SURGERY Bilateral   . NASAL SEPTUM SURGERY    . TONSILLECTOMY      Family History  Problem Relation Age of Onset  . Heart disease Mother   . Cancer Father        prostate  . Heart disease Father     New complaints: None todya  Social history: Is currently raising her grandson  Controlled substance contract: N/A has not had klonopin rx since 2018.  Review of Systems  Constitutional: Negative for activity change and appetite change.  HENT: Negative.   Eyes: Negative for pain.  Respiratory: Negative for shortness of breath.   Cardiovascular: Negative for chest pain, palpitations and leg swelling.  Gastrointestinal: Negative for abdominal pain.  Endocrine: Negative for polydipsia.  Genitourinary: Negative.   Skin: Negative for rash.  Neurological: Negative for dizziness, weakness and headaches.  Hematological: Does not bruise/bleed easily.  Psychiatric/Behavioral: Negative.   All other systems reviewed and are negative.      Objective:   Physical Exam Vitals signs and nursing note reviewed.  Constitutional:      General: She is not in acute distress.    Appearance: Normal  appearance. She is well-developed.  HENT:     Head: Normocephalic.     Nose: Nose normal.  Eyes:     Pupils: Pupils are equal, round, and reactive to light.  Neck:     Musculoskeletal: Normal range of motion and neck supple.     Vascular: No carotid bruit or JVD.  Cardiovascular:     Rate and Rhythm: Normal rate and regular rhythm.     Heart sounds: Normal heart sounds.  Pulmonary:     Effort: Pulmonary effort is normal. No respiratory distress.     Breath sounds: Normal breath sounds. No wheezing or rales.  Chest:     Chest wall: No tenderness.  Abdominal:     General: Bowel sounds are normal. There is no distension or abdominal bruit.     Palpations: Abdomen is soft. There is no hepatomegaly, splenomegaly, mass or pulsatile mass.     Tenderness: There is no abdominal tenderness.  Musculoskeletal: Normal range of motion.  Lymphadenopathy:     Cervical: No cervical adenopathy.  Skin:    General: Skin is warm and dry.  Neurological:     Mental Status: She is alert and oriented to person, place, and time.     Deep Tendon Reflexes: Reflexes are normal and symmetric.  Psychiatric:        Behavior: Behavior normal.        Thought Content: Thought content normal.        Judgment: Judgment normal.     BP 118/71   Pulse 78   Temp (!) 97.5 F (36.4 C) (Temporal)   Resp 16   Ht '5\' 6"'  (1.676 m)   Wt 208 lb (94.3 kg)   SpO2 97%   BMI 33.57 kg/m       Assessment & Plan:  Jane Howard comes in today with chief complaint of Medical Management of Chronic Issues   Diagnosis and orders addressed:  1. Essential hypertension, benign Low sodium diet - lisinopril (ZESTRIL) 40 MG tablet; Take 1 tablet (40 mg total) by mouth daily.  Dispense: 90 tablet; Refill: 3 - CMP14+EGFR  2. Mixed hyperlipidemia Low fat diet - LDL Cholesterol, Direct - atorvastatin (LIPITOR) 20 MG tablet; Take 1 tablet (20 mg total) by mouth daily.  Dispense: 30 tablet; Refill: 0 - Lipid panel  3.  GAD (generalized anxiety disorder) Stress management - clonazePAM (KLONOPIN) 0.5 MG tablet; Take 1 tablet (0.5 mg total) by mouth 2 (two) times daily as needed for anxiety.  Dispense: 60 tablet; Refill: 5  4. Vitamin D deficiency Take vitamin d daily  5. Chronic gout without tophus, unspecified cause, unspecified site - allopurinol (ZYLOPRIM) 100 MG tablet; Take 1 tablet (100 mg total) by mouth daily.  Dispense: 90 tablet; Refill: 1  6. Obesity (BMI 30-39.9)  Discussed diet and exercise for person with BMI >25 Will recheck weight in 3-6 months  7. Peripheral edema Elevate legs when sitting - furosemide (LASIX) 20 MG tablet; Take 1 tablet (20 mg total) by mouth daily.  Dispense: 90 tablet; Refill: 1  8. Right upper quadrant pain - omeprazole (PRILOSEC) 40 MG capsule; TAKE 1 CAPSULE BY MOUTH EVERY DAY  Dispense: 90 capsule; Refill: 1   Labs pending Health Maintenance reviewed Diet and exercise encouraged  Follow up plan: 6 months   Mary-Margaret Hassell Done, FNP

## 2018-11-15 NOTE — Patient Instructions (Signed)
Exercising to Stay Healthy To become healthy and stay healthy, it is recommended that you do moderate-intensity and vigorous-intensity exercise. You can tell that you are exercising at a moderate intensity if your heart starts beating faster and you start breathing faster but can still hold a conversation. You can tell that you are exercising at a vigorous intensity if you are breathing much harder and faster and cannot hold a conversation while exercising. Exercising regularly is important. It has many health benefits, such as:  Improving overall fitness, flexibility, and endurance.  Increasing bone density.  Helping with weight control.  Decreasing body fat.  Increasing muscle strength.  Reducing stress and tension.  Improving overall health. How often should I exercise? Choose an activity that you enjoy, and set realistic goals. Your health care provider can help you make an activity plan that works for you. Exercise regularly as told by your health care provider. This may include:  Doing strength training two times a week, such as: ? Lifting weights. ? Using resistance bands. ? Push-ups. ? Sit-ups. ? Yoga.  Doing a certain intensity of exercise for a given amount of time. Choose from these options: ? A total of 150 minutes of moderate-intensity exercise every week. ? A total of 75 minutes of vigorous-intensity exercise every week. ? A mix of moderate-intensity and vigorous-intensity exercise every week. Children, pregnant women, people who have not exercised regularly, people who are overweight, and older adults may need to talk with a health care provider about what activities are safe to do. If you have a medical condition, be sure to talk with your health care provider before you start a new exercise program. What are some exercise ideas? Moderate-intensity exercise ideas include:  Walking 1 mile (1.6 km) in about 15 minutes.  Biking.  Hiking.  Golfing.  Dancing.   Water aerobics. Vigorous-intensity exercise ideas include:  Walking 4.5 miles (7.2 km) or more in about 1 hour.  Jogging or running 5 miles (8 km) in about 1 hour.  Biking 10 miles (16.1 km) or more in about 1 hour.  Lap swimming.  Roller-skating or in-line skating.  Cross-country skiing.  Vigorous competitive sports, such as football, basketball, and soccer.  Jumping rope.  Aerobic dancing. What are some everyday activities that can help me to get exercise?  Yard work, such as: ? Pushing a lawn mower. ? Raking and bagging leaves.  Washing your car.  Pushing a stroller.  Shoveling snow.  Gardening.  Washing windows or floors. How can I be more active in my day-to-day activities?  Use stairs instead of an elevator.  Take a walk during your lunch break.  If you drive, park your car farther away from your work or school.  If you take public transportation, get off one stop early and walk the rest of the way.  Stand up or walk around during all of your indoor phone calls.  Get up, stretch, and walk around every 30 minutes throughout the day.  Enjoy exercise with a friend. Support to continue exercising will help you keep a regular routine of activity. What guidelines can I follow while exercising?  Before you start a new exercise program, talk with your health care provider.  Do not exercise so much that you hurt yourself, feel dizzy, or get very short of breath.  Wear comfortable clothes and wear shoes with good support.  Drink plenty of water while you exercise to prevent dehydration or heat stroke.  Work out until your breathing   and your heartbeat get faster. Where to find more information  [place] Department of Health and Human Services: www.hhs.gov  Centers for Disease Control and Prevention (CDC): www.cdc.gov Summary  Exercising regularly is important. It will improve your overall fitness, flexibility, and endurance.  Regular exercise also will  improve your overall health. It can help you control your weight, reduce stress, and improve your bone density.  Do not exercise so much that you hurt yourself, feel dizzy, or get very short of breath.  Before you start a new exercise program, talk with your health care provider. This information is not intended to replace advice given to you by your health care provider. Make sure you discuss any questions you have with your health care provider. Document Released: 02/28/2010 Document Revised: 01/08/2017 Document Reviewed: 12/17/2016 Elsevier Patient Education  2020 Elsevier Inc.  

## 2018-11-22 NOTE — Progress Notes (Signed)
patinet needs to come in and have blod work done. Did not do at appointment

## 2018-11-25 ENCOUNTER — Other Ambulatory Visit: Payer: Self-pay | Admitting: Family

## 2018-12-11 ENCOUNTER — Other Ambulatory Visit: Payer: Self-pay | Admitting: Nurse Practitioner

## 2018-12-11 DIAGNOSIS — E782 Mixed hyperlipidemia: Secondary | ICD-10-CM

## 2018-12-23 ENCOUNTER — Other Ambulatory Visit: Payer: Self-pay | Admitting: Family Medicine

## 2018-12-23 DIAGNOSIS — J0100 Acute maxillary sinusitis, unspecified: Secondary | ICD-10-CM

## 2019-02-24 ENCOUNTER — Ambulatory Visit: Payer: 59 | Admitting: Family Medicine

## 2019-02-24 ENCOUNTER — Other Ambulatory Visit: Payer: Self-pay

## 2019-02-24 ENCOUNTER — Encounter: Payer: Self-pay | Admitting: Family Medicine

## 2019-02-24 VITALS — BP 126/72 | HR 83 | Temp 97.0°F | Ht 66.0 in | Wt 213.0 lb

## 2019-02-24 DIAGNOSIS — N3001 Acute cystitis with hematuria: Secondary | ICD-10-CM

## 2019-02-24 LAB — MICROSCOPIC EXAMINATION
Renal Epithel, UA: NONE SEEN /hpf
WBC, UA: >30 /hpf — AB (ref 0–5)

## 2019-02-24 LAB — URINALYSIS, COMPLETE
Bilirubin, UA: NEGATIVE
Glucose, UA: NEGATIVE
Ketones, UA: NEGATIVE
Nitrite, UA: NEGATIVE
Specific Gravity, UA: 1.025 (ref 1.005–1.030)
Urobilinogen, Ur: 0.2 mg/dL (ref 0.2–1.0)
pH, UA: 6 (ref 5.0–7.5)

## 2019-02-24 MED ORDER — CEPHALEXIN 500 MG PO CAPS: 500.0000 mg | ORAL_CAPSULE | Freq: Two times a day (BID) | ORAL | 0 refills | Status: AC

## 2019-02-24 MED ORDER — PHENAZOPYRIDINE HCL 200 MG PO TABS: 200.0000 mg | ORAL_TABLET | Freq: Three times a day (TID) | ORAL | 0 refills | Status: DC | PRN

## 2019-02-24 NOTE — Progress Notes (Signed)
Subjective: CC: UTI PCP: Chevis Pretty, FNP JHE:[id number] D Purdum is a 64 y.o. female presenting to clinic today for:  1. Urinary symptoms Patient reports a 3 day h/o dysuria, urinary frequency.  Symptoms got worse over the last day.  She denies urgency, hematuria, fevers, chills, abdominal pain, nausea, vomiting, back pain, vaginal discharge.  Patient has used nothing for symptoms.  Patient reports a h/o frequent or recurrent UTIs.  Was previously seeing a urologist but he since retired.  Last UTI was greater than 1 year ago.  She reports allergy to Macrobid    ROS: Per HPI  Allergies  Allergen Reactions  . Macrodantin [Nitrofurantoin Macrocrystal]   . Vicodin [Hydrocodone-Acetaminophen]    Past Medical History:  Diagnosis Date  . Anxiety   . Nephrolithiasis     Current Outpatient Medications:  .  allopurinol (ZYLOPRIM) 100 MG tablet, Take 1 tablet (100 mg total) by mouth daily., Disp: 90 tablet, Rfl: 1 .  aspirin EC 81 MG tablet, Take 81 mg by mouth daily., Disp: , Rfl:  .  atorvastatin (LIPITOR) 20 MG tablet, TAKE 1 TABLET BY MOUTH EVERY DAY, Disp: 90 tablet, Rfl: 1 .  clonazePAM (KLONOPIN) 0.5 MG tablet, Take 1 tablet (0.5 mg total) by mouth 2 (two) times daily as needed for anxiety., Disp: 60 tablet, Rfl: 5 .  escitalopram (LEXAPRO) 10 MG tablet, TAKE 1 TABLET (10 MG TOTAL) BY MOUTH DAILY. (NEEDS TO BE SEEN BEFORE NEXT REFILL), Disp: 90 tablet, Rfl: 0 .  fluticasone (FLONASE) 50 MCG/ACT nasal spray, PLACE 1 SPRAY INTO BOTH NOSTRILS 2 (TWO) TIMES DAILY AS NEEDED FOR ALLERGIES OR RHINITIS., Disp: 48 mL, Rfl: 6 .  furosemide (LASIX) 20 MG tablet, Take 1 tablet (20 mg total) by mouth daily., Disp: 90 tablet, Rfl: 1 .  lisinopril (ZESTRIL) 40 MG tablet, Take 1 tablet (40 mg total) by mouth daily., Disp: 90 tablet, Rfl: 3 .  Multiple Vitamin (MULTI-DAY PO), Take 1 tablet by mouth daily. L-Lysine, Disp: , Rfl:  .  omeprazole (PRILOSEC) 40 MG capsule, TAKE 1 CAPSULE BY MOUTH  EVERY DAY, Disp: 90 capsule, Rfl: 1 Social History   Socioeconomic History  . Marital status: Married    Spouse name: Not on file  . Number of children: Not on file  . Years of education: Not on file  . Highest education level: Not on file  Occupational History  . Not on file  Tobacco Use  . Smoking status: Never Smoker  . Smokeless tobacco: Never Used  Substance and Sexual Activity  . Alcohol use: No  . Drug use: No  . Sexual activity: Not on file  Other Topics Concern  . Not on file  Social History Narrative  . Not on file   Social Determinants of Health   Financial Resource Strain:   . Difficulty of Paying Living Expenses: Not on file  Food Insecurity:   . Worried About Charity fundraiser in the Last Year: Not on file  . Ran Out of Food in the Last Year: Not on file  Transportation Needs:   . Lack of Transportation (Medical): Not on file  . Lack of Transportation (Non-Medical): Not on file  Physical Activity:   . Days of Exercise per Week: Not on file  . Minutes of Exercise per Session: Not on file  Stress:   . Feeling of Stress : Not on file  Social Connections:   . Frequency of Communication with Friends and Family: Not on file  .  Frequency of Social Gatherings with Friends and Family: Not on file  . Attends Religious Services: Not on file  . Active Member of Clubs or Organizations: Not on file  . Attends Banker Meetings: Not on file  . Marital Status: Not on file  Intimate Partner Violence:   . Fear of Current or Ex-Partner: Not on file  . Emotionally Abused: Not on file  . Physically Abused: Not on file  . Sexually Abused: Not on file   Family History  Problem Relation Age of Onset  . Heart disease Mother   . Cancer Father        prostate  . Heart disease Father     Objective: Office vital signs reviewed. BP 126/72   Pulse 83   Temp (!) 97 F (36.1 C) (Temporal)   Ht 5\' 6"  (1.676 m)   Wt 213 lb (96.6 kg)   BMI 34.38 kg/m    Physical Examination:  General: Awake, alert, well appearing, No acute distress GU: Mild suprapubic tenderness palpation.  No CVA tenderness palpation.  Assessment/ Plan: 64 y.o. female   1. Acute cystitis with hematuria Urinalysis consistent with acute cystitis.  I reviewed her last urine culture which showed sensitivity to cephalosporins.  Keflex twice daily sent.  Pyridium sent.  Home care instructions reviewed with the patient reasons for reevaluation discussed.  She was good understanding will follow as needed - urinalysis- dip and micro - cephALEXin (KEFLEX) 500 MG capsule; Take 1 capsule (500 mg total) by mouth 2 (two) times daily for 7 days.  Dispense: 14 capsule; Refill: 0 - phenazopyridine (PYRIDIUM) 200 MG tablet; Take 1 tablet (200 mg total) by mouth 3 (three) times daily as needed for pain.  Dispense: 10 tablet; Refill: 0 - Urine culture   Orders Placed This Encounter  Procedures  . urinalysis- dip and micro   No orders of the defined types were placed in this encounter.    64, DO [facility] [phone]

## 2019-02-24 NOTE — Patient Instructions (Signed)
Please call the after-hours line if anything gets worse or you develop any worsening symptoms or signs that we discussed during today's visit.  Otherwise, anticipate that your symptoms should get better within the next day or so.   Urinary Tract Infection, Adult A urinary tract infection (UTI) is an infection of any part of the urinary tract. The urinary tract includes:  The kidneys.  The ureters.  The bladder.  The urethra. These organs make, store, and get rid of pee (urine) in the body. What are the causes? This is caused by germs (bacteria) in your genital area. These germs grow and cause swelling (inflammation) of your urinary tract. What increases the risk? You are more likely to develop this condition if:  You have a small, thin tube (catheter) to drain pee.  You cannot control when you pee or poop (incontinence).  You are female, and: ? You use these methods to prevent pregnancy:  A medicine that kills sperm (spermicide).  A device that blocks sperm (diaphragm). ? You have low levels of a female hormone (estrogen). ? You are pregnant.  You have genes that add to your risk.  You are sexually active.  You take antibiotic medicines.  You have trouble peeing because of: ? A prostate that is bigger than normal, if you are female. ? A blockage in the part of your body that drains pee from the bladder (urethra). ? A kidney stone. ? A nerve condition that affects your bladder (neurogenic bladder). ? Not getting enough to drink. ? Not peeing often enough.  You have other conditions, such as: ? Diabetes. ? A weak disease-fighting system (immune system). ? Sickle cell disease. ? Gout. ? Injury of the spine. What are the signs or symptoms? Symptoms of this condition include:  Needing to pee right away (urgently).  Peeing often.  Peeing small amounts often.  Pain or burning when peeing.  Blood in the pee.  Pee that smells bad or not like normal.  Trouble  peeing.  Pee that is cloudy.  Fluid coming from the vagina, if you are female.  Pain in the belly or lower back. Other symptoms include:  Throwing up (vomiting).  No urge to eat.  Feeling mixed up (confused).  Being tired and grouchy (irritable).  A fever.  Watery poop (diarrhea). How is this treated? This condition may be treated with:  Antibiotic medicine.  Other medicines.  Drinking enough water. Follow these instructions at home:  Medicines  Take over-the-counter and prescription medicines only as told by your doctor.  If you were prescribed an antibiotic medicine, take it as told by your doctor. Do not stop taking it even if you start to feel better. General instructions  Make sure you: ? Pee until your bladder is empty. ? Do not hold pee for a long time. ? Empty your bladder after sex. ? Wipe from front to back after pooping if you are a female. Use each tissue one time when you wipe.  Drink enough fluid to keep your pee pale yellow.  Keep all follow-up visits as told by your doctor. This is important. Contact a doctor if:  You do not get better after 1-2 days.  Your symptoms go away and then come back. Get help right away if:  You have very bad back pain.  You have very bad pain in your lower belly.  You have a fever.  You are sick to your stomach (nauseous).  You are throwing up. Summary  A  urinary tract infection (UTI) is an infection of any part of the urinary tract.  This condition is caused by germs in your genital area.  There are many risk factors for a UTI. These include having a small, thin tube to drain pee and not being able to control when you pee or poop.  Treatment includes antibiotic medicines for germs.  Drink enough fluid to keep your pee pale yellow. This information is not intended to replace advice given to you by your health care provider. Make sure you discuss any questions you have with your health care  provider. Document Revised: 01/13/2018 Document Reviewed: 08/05/2017 Elsevier Patient Education  2020 [employer].

## 2019-02-27 LAB — URINE CULTURE

## 2019-03-06 ENCOUNTER — Ambulatory Visit (INDEPENDENT_AMBULATORY_CARE_PROVIDER_SITE_OTHER): Payer: 59 | Admitting: Family Medicine

## 2019-03-06 DIAGNOSIS — N3020 Other chronic cystitis without hematuria: Secondary | ICD-10-CM | POA: Diagnosis not present

## 2019-03-06 MED ORDER — SULFAMETHOXAZOLE-TRIMETHOPRIM 800-160 MG PO TABS: 1.0000 | ORAL_TABLET | Freq: Two times a day (BID) | ORAL | 0 refills | Status: AC

## 2019-03-06 NOTE — Progress Notes (Signed)
Telephone visit  Subjective: CC: UTI PCP: Jane Pierini, FNP [id number] D Jane Howard is a 64 y.o. female calls for telephone consult today. Patient provides verbal consent for consult held via phone.  Due to COVID-19 pandemic this visit was conducted virtually. This visit type was conducted due to national recommendations for restrictions regarding the COVID-19 Pandemic (e.g. social distancing, sheltering in place) in an effort to limit this patient's exposure and mitigate transmission in our community. All issues noted in this document were discussed and addressed.  A physical exam was not performed with this format.   Location of patient: home Location of provider: [facility] Others present for call: none  1.  UTI Patient was seen about 10 days ago for urinary tract infection.  Her urine culture grew pansensitive E. coli.  She was treated with 7 days of Keflex.  She does note that symptoms did seem to improve during that time but had not totally resolved by the end of the Keflex.  She reports ongoing dysuria and frequency.  She denies any symptoms become more prominent if she does not take the Pyridium or Tylenol.  Does not report any nausea, vomiting, fevers, new back pain.  She is trying to drink as much water as possible to flush her kidneys.  ROS: Per HPI  Allergies  Allergen Reactions  . Macrodantin [Nitrofurantoin Macrocrystal]   . Vicodin [Hydrocodone-Acetaminophen]    Past Medical History:  Diagnosis Date  . Anxiety   . Nephrolithiasis     Current Outpatient Medications:  .  allopurinol (ZYLOPRIM) 100 MG tablet, Take 1 tablet (100 mg total) by mouth daily., Disp: 90 tablet, Rfl: 1 .  aspirin EC 81 MG tablet, Take 81 mg by mouth daily., Disp: , Rfl:  .  atorvastatin (LIPITOR) 20 MG tablet, TAKE 1 TABLET BY MOUTH EVERY DAY, Disp: 90 tablet, Rfl: 1 .  clonazePAM (KLONOPIN) 0.5 MG tablet, Take 1 tablet (0.5 mg total) by mouth 2 (two) times daily as needed for anxiety., Disp: 60  tablet, Rfl: 5 .  escitalopram (LEXAPRO) 10 MG tablet, TAKE 1 TABLET (10 MG TOTAL) BY MOUTH DAILY. (NEEDS TO BE SEEN BEFORE NEXT REFILL), Disp: 90 tablet, Rfl: 0 .  fluticasone (FLONASE) 50 MCG/ACT nasal spray, PLACE 1 SPRAY INTO BOTH NOSTRILS 2 (TWO) TIMES DAILY AS NEEDED FOR ALLERGIES OR RHINITIS., Disp: 48 mL, Rfl: 6 .  furosemide (LASIX) 20 MG tablet, Take 1 tablet (20 mg total) by mouth daily., Disp: 90 tablet, Rfl: 1 .  lisinopril (ZESTRIL) 40 MG tablet, Take 1 tablet (40 mg total) by mouth daily., Disp: 90 tablet, Rfl: 3 .  Multiple Vitamin (MULTI-DAY PO), Take 1 tablet by mouth daily. L-Lysine, Disp: , Rfl:  .  omeprazole (PRILOSEC) 40 MG capsule, TAKE 1 CAPSULE BY MOUTH EVERY DAY, Disp: 90 capsule, Rfl: 1 .  phenazopyridine (PYRIDIUM) 200 MG tablet, Take 1 tablet (200 mg total) by mouth 3 (three) times daily as needed for pain., Disp: 10 tablet, Rfl: 0  Assessment/ Plan: 64 y.o. female   1. Subacute cystitis Ongoing symptoms.  The Keflex really should have taken care of the urinary tract infection based on sensitivities but because she has ongoing symptoms I am going to switch her to Bactrim instead.  We will plan for prolonged course of that as well.  We discussed continuing with increased fluid.  If she has ongoing symptoms despite repeat antibiotics, I am recommending that she return to the office for repeat urine sample.  She was good understanding of  the plan.  She will follow up as needed. - sulfamethoxazole-trimethoprim (BACTRIM DS) 800-160 MG tablet; Take 1 tablet by mouth 2 (two) times daily for 7 days.  Dispense: 14 tablet; Refill: 0   Start time: 8:19am End time: 8:23am  Total time spent on patient care (including telephone call/ virtual visit): 10 minutes  Jane Howard, Jane Howard [phone]

## 2019-05-16 ENCOUNTER — Ambulatory Visit: Payer: Self-pay | Admitting: Nurse Practitioner

## 2019-05-21 ENCOUNTER — Other Ambulatory Visit: Payer: Self-pay | Admitting: Nurse Practitioner

## 2019-05-21 DIAGNOSIS — R609 Edema, unspecified: Secondary | ICD-10-CM

## 2019-05-21 DIAGNOSIS — M1A9XX0 Chronic gout, unspecified, without tophus (tophi): Secondary | ICD-10-CM

## 2019-06-09 ENCOUNTER — Other Ambulatory Visit: Payer: Self-pay | Admitting: Nurse Practitioner

## 2019-06-09 DIAGNOSIS — E782 Mixed hyperlipidemia: Secondary | ICD-10-CM

## 2019-07-07 ENCOUNTER — Other Ambulatory Visit: Payer: Self-pay | Admitting: Nurse Practitioner

## 2019-07-07 DIAGNOSIS — E782 Mixed hyperlipidemia: Secondary | ICD-10-CM

## 2019-07-30 ENCOUNTER — Other Ambulatory Visit: Payer: Self-pay | Admitting: Nurse Practitioner

## 2019-07-30 DIAGNOSIS — E782 Mixed hyperlipidemia: Secondary | ICD-10-CM

## 2019-07-31 NOTE — Telephone Encounter (Signed)
MMM NTBs 30 days given 07/07/19

## 2019-08-01 NOTE — Telephone Encounter (Signed)
Appointment scheduled 08/04/2019 with Jane Howard, patient aware.

## 2019-08-04 ENCOUNTER — Other Ambulatory Visit: Payer: Self-pay

## 2019-08-04 ENCOUNTER — Encounter: Payer: Self-pay | Admitting: Nurse Practitioner

## 2019-08-04 ENCOUNTER — Ambulatory Visit: Payer: 59 | Admitting: Nurse Practitioner

## 2019-08-04 VITALS — BP 124/66 | HR 70 | Temp 98.4°F | Ht 66.0 in | Wt 204.2 lb

## 2019-08-04 DIAGNOSIS — E782 Mixed hyperlipidemia: Secondary | ICD-10-CM

## 2019-08-04 DIAGNOSIS — F411 Generalized anxiety disorder: Secondary | ICD-10-CM | POA: Diagnosis not present

## 2019-08-04 DIAGNOSIS — E669 Obesity, unspecified: Secondary | ICD-10-CM | POA: Diagnosis not present

## 2019-08-04 DIAGNOSIS — R609 Edema, unspecified: Secondary | ICD-10-CM

## 2019-08-04 DIAGNOSIS — R1011 Right upper quadrant pain: Secondary | ICD-10-CM

## 2019-08-04 DIAGNOSIS — E559 Vitamin D deficiency, unspecified: Secondary | ICD-10-CM

## 2019-08-04 DIAGNOSIS — M1A9XX0 Chronic gout, unspecified, without tophus (tophi): Secondary | ICD-10-CM | POA: Diagnosis not present

## 2019-08-04 DIAGNOSIS — I10 Essential (primary) hypertension: Secondary | ICD-10-CM | POA: Diagnosis not present

## 2019-08-04 MED ORDER — FUROSEMIDE 20 MG PO TABS: 20.0000 mg | ORAL_TABLET | Freq: Every day | ORAL | 1 refills | Status: DC

## 2019-08-04 MED ORDER — LISINOPRIL 40 MG PO TABS: 40.0000 mg | ORAL_TABLET | Freq: Every day | ORAL | 3 refills | Status: DC

## 2019-08-04 MED ORDER — OMEPRAZOLE 40 MG PO CPDR: DELAYED_RELEASE_CAPSULE | ORAL | 1 refills | Status: DC

## 2019-08-04 MED ORDER — ATORVASTATIN CALCIUM 20 MG PO TABS: 20.0000 mg | ORAL_TABLET | Freq: Every day | ORAL | 0 refills | Status: DC

## 2019-08-04 MED ORDER — ESCITALOPRAM OXALATE 10 MG PO TABS: 10.0000 mg | ORAL_TABLET | Freq: Every day | ORAL | 0 refills | Status: DC

## 2019-08-04 MED ORDER — ALLOPURINOL 100 MG PO TABS: 100.0000 mg | ORAL_TABLET | Freq: Every day | ORAL | 1 refills | Status: DC

## 2019-08-04 MED ORDER — FENOFIBRATE 145 MG PO TABS: 145.0000 mg | ORAL_TABLET | Freq: Every day | ORAL | 1 refills | Status: DC

## 2019-08-04 MED ORDER — CLONAZEPAM 0.5 MG PO TABS: 0.5000 mg | ORAL_TABLET | Freq: Two times a day (BID) | ORAL | 5 refills | Status: DC | PRN

## 2019-08-04 NOTE — Patient Instructions (Signed)
Complicated Grief Grief is a normal response to the death of someone close to you. Feelings of fear, anger, and guilt can affect almost everyone who loses a loved one. It is also common to have symptoms of depression while you are grieving. These include problems with sleep, loss of appetite, and lack of energy. They may last for weeks or months after a loss. Complicated grief is different from normal grief or depression. Normal grieving involves sadness and feelings of loss, but those feelings get better and heal over time. Complicated grief is a severe type of grief that lasts for a long time, usually for several months to a year or longer. It interferes with your ability to function normally. Complicated grief may require treatment from a mental health care provider. What are the causes? The cause of this condition is not known. It is not clear why some people continue to struggle with grief and others do not. What increases the risk? You are more likely to develop this condition if:  The death of your loved one was sudden or unexpected.  The death of your loved one was due to a violent event.  Your loved one died from suicide.  Your loved one was a child or a young person.  You were very close to your loved one, or you were dependent on him or her.  You have a history of depression or anxiety. What are the signs or symptoms? Symptoms of this condition include:  Feeling disbelief or having a lack of emotion (numbness).  Being unable to enjoy good memories of your loved one.  Needing to avoid anything or anyone that reminds you of your loved one.  Being unable to stop thinking about the death.  Feeling intense anger or guilt.  Feeling alone and hopeless.  Feeling that your life is meaningless and empty.  Losing the desire to move on with your life. How is this diagnosed? This condition may be diagnosed based on:  Your symptoms. Complicated grief will be diagnosed if you have  ongoing symptoms of grief for 6-12 months or longer.  The effect of symptoms on your life. You may be diagnosed with this condition if your symptoms are interfering with your ability to live your life. Your health care provider may recommend that you see a mental health care provider. Many symptoms of depression are similar to the symptoms of complicated grief. It is important to be evaluated for complicated grief along with other mental health conditions. How is this treated? This condition is most commonly treated with talk therapy. This therapy is offered by a mental health specialist (psychiatrist). During therapy:  You will learn healthy ways to cope with the loss of your loved one.  Your mental health care provider may recommend antidepressant medicines. Follow these instructions at home: Lifestyle   Take care of yourself. ? Eat on a regular basis, and maintain a healthy diet. Eat plenty of fruits, vegetables, lean protein, and whole grains. ? Try to get some exercise each day. Aim for 30 minutes of exercise on most days of the week. ? Keep a consistent sleep schedule. Try to get 8 or more hours of sleep each night. ? Start doing the things that you used to enjoy.  Do not use drugs or alcohol to ease your symptoms.  Spend time with friends and loved ones. General instructions  Take over-the-counter and prescription medicines only as told by your health care provider.  Consider joining a grief (bereavement) support group   to help you deal with your loss.  Keep all follow-up visits as told by your health care provider. This is important. Contact a health care provider if:  Your symptoms prevent you from functioning normally.  Your symptoms do not get better with treatment. Get help right away if:  You have serious thoughts about hurting yourself or someone else.  You have suicidal feelings. If you ever feel like you may hurt yourself or others, or have thoughts about taking  your own life, get help right away. You can go to your nearest emergency department or call:  Your local emergency services (911 in the [place]  A suicide crisis helpline, such as the National Suicide Prevention Lifeline at 1-800-273-8255. This is open 24 hours a day. Summary  Complicated grief is a severe type of grief that lasts for a long time. This grief is not likely to go away on its own. Get the help you need.  Some griefs are more difficult than others and can cause this condition. You may need a certain type of treatment to help you recover if the loss of your loved one was sudden, violent, or due to suicide.  You may feel guilty about moving on with your life. Getting help does not mean that you are forgetting your loved one. It means that you are taking care of yourself.  Complicated grief is best treated with talk therapy. Medicines may also be prescribed.  Seek the help you need, and find support that will help you recover. This information is not intended to replace advice given to you by your health care provider. Make sure you discuss any questions you have with your health care provider. Document Revised: 01/08/2017 Document Reviewed: 11/11/2016 Elsevier Patient Education  2020 [employer]  

## 2019-08-04 NOTE — Progress Notes (Signed)
Subjective:    Patient ID: Jane Howard, female    DOB: 05-Mar-1955, 64 y.o.   MRN: [id number]   Chief Complaint: Medical Management of Chronic Issues    HPI:  1. Essential hypertension, benign Patient does not check her blood pressure at home. Does folow a sodium restricted diet.  BP Readings from Last 3 Encounters:  02/24/19 126/72  11/15/18 118/71  03/07/18 118/74     2. Chronic gout without tophus, unspecified cause, unspecified site Reports no recent flare-ups since taking Allopurinol.   3. GAD (generalized anxiety disorder) Reports mild anxiety related to life events. No reports of panic episodes.  GAD 7 : Generalized Anxiety Score 08/04/2019 11/15/2018  Nervous, Anxious, on Edge 1 0  Control/stop worrying 1 0  Worry too much - different things 0 0  Trouble relaxing 1 0  Restless 1 0  Easily annoyed or irritable 1 0  Afraid - awful might happen 0 0  Total GAD 7 Score 5 0  Anxiety Difficulty Somewhat difficult Not difficult at all      4. Obesity (BMI 30-39.9) Watches her fat and carb intake most of the time. Weighs herself at home every week.  BMI Readings from Last 3 Encounters:  08/04/19 32.97 kg/m  02/24/19 34.38 kg/m  11/15/18 33.57 kg/m    Wt Readings from Last 3 Encounters:  08/04/19 204 lb 4 oz (92.6 kg)  02/24/19 213 lb (96.6 kg)  11/15/18 208 lb (94.3 kg)    5. Mixed hyperlipidemia States she does watch her fat intake. Reports starting an exercise regimen recently. Lab Results  Component Value Date   CHOL 263 (H) 11/27/2016   HDL 44 11/27/2016   LDLCALC Comment 11/27/2016   TRIG 423 (H) 11/27/2016   CHOLHDL 6.0 (H) 11/27/2016     6. Vitamin D deficiency Takes a multivitamin daily.  Last vitamin D Lab Results  Component Value Date   VD25OH 15.4 (L) 11/27/2016      Outpatient Encounter Medications as of 08/04/2019  Medication Sig  . allopurinol (ZYLOPRIM) 100 MG tablet TAKE 1 TABLET BY MOUTH EVERY DAY  . aspirin EC 81 MG  tablet Take 81 mg by mouth daily.  Marland Kitchen atorvastatin (LIPITOR) 20 MG tablet TAKE 1 TABLET (20 MG TOTAL) BY MOUTH DAILY. (NEEDS TO BE SEEN BEFORE NEXT REFILL)  . clonazePAM (KLONOPIN) 0.5 MG tablet Take 1 tablet (0.5 mg total) by mouth 2 (two) times daily as needed for anxiety.  Marland Kitchen escitalopram (LEXAPRO) 10 MG tablet TAKE 1 TABLET (10 MG TOTAL) BY MOUTH DAILY. (NEEDS TO BE SEEN BEFORE NEXT REFILL)  . fenofibrate (TRICOR) 145 MG tablet Take 145 mg by mouth daily.  . fluticasone (FLONASE) 50 MCG/ACT nasal spray PLACE 1 SPRAY INTO BOTH NOSTRILS 2 (TWO) TIMES DAILY AS NEEDED FOR ALLERGIES OR RHINITIS.  . furosemide (LASIX) 20 MG tablet TAKE 1 TABLET BY MOUTH EVERY DAY  . lisinopril (ZESTRIL) 40 MG tablet Take 1 tablet (40 mg total) by mouth daily.  . Multiple Vitamin (MULTI-DAY PO) Take 1 tablet by mouth daily. L-Lysine  . omeprazole (PRILOSEC) 40 MG capsule TAKE 1 CAPSULE BY MOUTH EVERY DAY     Past Surgical History:  Procedure Laterality Date  . ABDOMINAL HYSTERECTOMY    . FOOT SURGERY Bilateral   . NASAL SEPTUM SURGERY    . TONSILLECTOMY      Family History  Problem Relation Age of Onset  . Heart disease Mother   . Cancer Father  prostate  . Heart disease Father     New complaints: No new complaints noted.   Social history: Lives with her husband and 79-year old grandson at home for whom she is the primary caregiver. Patient retired from work last year.   Controlled substance contract: n/a     Review of Systems  Constitutional: Negative.   HENT: Negative.   Eyes: Negative.   Respiratory: Negative.   Cardiovascular: Negative.   Gastrointestinal: Negative.   Endocrine: Negative.   Genitourinary: Negative.   Skin: Negative.   Allergic/Immunologic: Negative.   Neurological: Negative.   Hematological: Negative.   Psychiatric/Behavioral: Negative.        Objective:   Physical Exam Constitutional:      Appearance: Normal appearance. She is obese.  HENT:     Head:  Normocephalic and atraumatic.     Nose: Nose normal.     Mouth/Throat:     Mouth: Mucous membranes are moist.     Pharynx: Oropharynx is clear.  Eyes:     Extraocular Movements: Extraocular movements intact.     Conjunctiva/sclera: Conjunctivae normal.     Pupils: Pupils are equal, round, and reactive to light.  Cardiovascular:     Rate and Rhythm: Normal rate and regular rhythm.     Pulses: Normal pulses.     Heart sounds: Normal heart sounds.  Pulmonary:     Effort: Pulmonary effort is normal.     Breath sounds: Normal breath sounds.  Abdominal:     General: Bowel sounds are normal.     Palpations: Abdomen is soft.  Genitourinary:    General: Normal vulva.     Rectum: Normal.  Musculoskeletal:     Cervical back: Normal range of motion and neck supple.  Skin:    General: Skin is warm and dry.     Capillary Refill: Capillary refill takes less than 2 seconds.  Neurological:     General: No focal deficit present.     Mental Status: She is alert and oriented to person, place, and time. Mental status is at baseline.  Psychiatric:        Mood and Affect: Mood normal.        Behavior: Behavior normal.        Thought Content: Thought content normal.        Judgment: Judgment normal.     BP 124/66   Pulse 70   Temp 98.4 F (36.9 C) (Temporal)   Ht 5\' 6"  (1.676 m)   Wt 204 lb 4 oz (92.6 kg)   BMI 32.97 kg/m        Assessment & Plan:  Jane Howard comes in today with chief complaint of Medical Management of Chronic Issues   Diagnosis and orders addressed:  1. Essential hypertension, benign Patient encouraged to follow a sodium restricted diet and exercise as tolerated. Pt encouraged to continue checking her blood pressure.   2. Chronic gout without tophus, unspecified cause, unspecified site Patient instructed to avoid alcohol and continue with the allopurinol.  3. GAD (generalized anxiety disorder) Patient instructed to consider journaling her anxiety  episodes. Continue to take Clonazepam prn.  4. Obesity (BMI 30-39.9) Patient instructed to continue to weigh herself weekly or more. Patient encouraged to watch her fat intake.  5. Mixed hyperlipidemia Patient encouraged to exercise as tolerated and watch her fat intake.  6. Vitamin D deficiency Patient instructed to continue taking the multivitamin as long as Vitamin D levels are normal.   Labs pending  Health Maintenance reviewed Diet and exercise encouraged  Follow up plan: Follow-up in 6 months.  Jefm Bryant, BSN, RN Mary-Margaret Daphine Deutscher, FNP

## 2019-08-05 LAB — CBC WITH DIFFERENTIAL/PLATELET
Basophils Absolute: 0.1 10*3/uL (ref 0.0–0.2)
Basos: 1 %
EOS (ABSOLUTE): 0.1 10*3/uL (ref 0.0–0.4)
Eos: 1 %
Hematocrit: 40.1 % (ref 34.0–46.6)
Hemoglobin: 13.6 g/dL (ref 11.1–15.9)
Immature Grans (Abs): 0 10*3/uL (ref 0.0–0.1)
Immature Granulocytes: 0 %
Lymphocytes Absolute: 3.4 10*3/uL — ABNORMAL HIGH (ref 0.7–3.1)
Lymphs: 40 %
MCH: 30 pg (ref 26.6–33.0)
MCHC: 33.9 g/dL (ref 31.5–35.7)
MCV: 88 fL (ref 79–97)
Monocytes Absolute: 0.6 10*3/uL (ref 0.1–0.9)
Monocytes: 7 %
Neutrophils Absolute: 4.4 10*3/uL (ref 1.4–7.0)
Neutrophils: 51 %
Platelets: 298 10*3/uL (ref 150–450)
RBC: 4.54 x10E6/uL (ref 3.77–5.28)
RDW: 12.7 % (ref 11.7–15.4)
WBC: 8.6 10*3/uL (ref 3.4–10.8)

## 2019-08-05 LAB — CMP14+EGFR
ALT: 26 IU/L (ref 0–32)
AST: 22 IU/L (ref 0–40)
Albumin/Globulin Ratio: 1.5 (ref 1.2–2.2)
Albumin: 4.4 g/dL (ref 3.8–4.8)
Alkaline Phosphatase: 63 IU/L (ref 48–121)
BUN/Creatinine Ratio: 26 (ref 12–28)
BUN: 27 mg/dL (ref 8–27)
Bilirubin Total: 0.3 mg/dL (ref 0.0–1.2)
CO2: 25 mmol/L (ref 20–29)
Calcium: 10.1 mg/dL (ref 8.7–10.3)
Chloride: 103 mmol/L (ref 96–106)
Creatinine, Ser: 1.02 mg/dL — ABNORMAL HIGH (ref 0.57–1.00)
GFR calc Af Amer: 68 mL/min/{1.73_m2} (ref >59)
GFR calc non Af Amer: 59 mL/min/{1.73_m2} — ABNORMAL LOW (ref >59)
Globulin, Total: 3 g/dL (ref 1.5–4.5)
Glucose: 105 mg/dL — ABNORMAL HIGH (ref 65–99)
Potassium: 4.7 mmol/L (ref 3.5–5.2)
Sodium: 139 mmol/L (ref 134–144)
Total Protein: 7.4 g/dL (ref 6.0–8.5)

## 2019-08-05 LAB — LIPID PANEL
Chol/HDL Ratio: 3.6 ratio (ref 0.0–4.4)
Cholesterol, Total: 187 mg/dL (ref 100–199)
HDL: 52 mg/dL (ref >39)
LDL Chol Calc (NIH): 99 mg/dL (ref 0–99)
Triglycerides: 209 mg/dL — ABNORMAL HIGH (ref 0–149)
VLDL Cholesterol Cal: 36 mg/dL (ref 5–40)

## 2019-08-24 ENCOUNTER — Ambulatory Visit (INDEPENDENT_AMBULATORY_CARE_PROVIDER_SITE_OTHER): Payer: 59 | Admitting: Family

## 2019-08-24 ENCOUNTER — Encounter: Payer: Self-pay | Admitting: Family

## 2019-08-24 DIAGNOSIS — R399 Unspecified symptoms and signs involving the genitourinary system: Secondary | ICD-10-CM | POA: Diagnosis not present

## 2019-08-24 MED ORDER — CEPHALEXIN 500 MG PO CAPS: 500.0000 mg | ORAL_CAPSULE | Freq: Two times a day (BID) | ORAL | 0 refills | Status: DC

## 2019-08-24 MED ORDER — FLUCONAZOLE 150 MG PO TABS
150.0000 mg | ORAL_TABLET | ORAL | 0 refills | Status: DC | PRN
Start: 2019-08-24 — End: 2020-02-06

## 2019-08-24 NOTE — Progress Notes (Signed)
   Virtual Visit via telephone Note Due to COVID-19 pandemic this visit was conducted virtually. This visit type was conducted due to national recommendations for restrictions regarding the COVID-19 Pandemic (e.g. social distancing, sheltering in place) in an effort to limit this patient's exposure and mitigate transmission in our community. All issues noted in this document were discussed and addressed.  A physical exam was not performed with this format.  I connected with Jane Howard on 08/24/19 at 11:15 AM by telephone and verified that I am speaking with the correct person using two identifiers. Jane Howard is currently located at mother's home and no one is currently with her  during visit. The provider, Jannifer Rodney, FNP is located in their office at time of visit.  I discussed the limitations, risks, security and privacy concerns of performing an evaluation and management service by telephone and the availability of in person appointments. I also discussed with the patient that there may be a patient responsible charge related to this service. The patient expressed understanding and agreed to proceed.   History and Present Illness:  Urinary Frequency  This is a new problem. The current episode started 1 to 4 weeks ago. The problem occurs intermittently. The problem has been waxing and waning. The quality of the pain is described as burning. The pain is at a severity of 8/10. The pain is mild. There has been no fever. Associated symptoms include frequency and urgency. Pertinent negatives include no flank pain, hematuria, hesitancy or nausea. She has tried increased fluids for the symptoms. The treatment provided mild relief.      Review of Systems  Gastrointestinal: Negative for nausea.  Genitourinary: Positive for frequency and urgency. Negative for flank pain, hematuria and hesitancy.  All other systems reviewed and are negative.    Observations/Objective: No SOB or distress  noted  Assessment and Plan: 1. UTI symptoms Force fluids AZO over the counter X2 days RTO if symptoms worsen or do not improve  - cephALEXin (KEFLEX) 500 MG capsule; Take 1 capsule (500 mg total) by mouth 2 (two) times daily.  Dispense: 14 capsule; Refill: 0     I discussed the assessment and treatment plan with the patient. The patient was provided an opportunity to ask questions and all were answered. The patient agreed with the plan and demonstrated an understanding of the instructions.   The patient was advised to call back or seek an in-person evaluation if the symptoms worsen or if the condition fails to improve as anticipated.  The above assessment and management plan was discussed with the patient. The patient verbalized understanding of and has agreed to the management plan. Patient is aware to call the clinic if symptoms persist or worsen. Patient is aware when to return to the clinic for a follow-up visit. Patient educated on when it is appropriate to go to the emergency department.   Time call ended:  11:21 AM   I provided 6 minutes of non-face-to-face time during this encounter.    Jannifer Rodney, FNP

## 2019-09-02 ENCOUNTER — Other Ambulatory Visit: Payer: Self-pay | Admitting: Nurse Practitioner

## 2019-09-02 DIAGNOSIS — E782 Mixed hyperlipidemia: Secondary | ICD-10-CM

## 2019-09-08 ENCOUNTER — Other Ambulatory Visit: Payer: Self-pay | Admitting: Family

## 2019-09-08 ENCOUNTER — Other Ambulatory Visit: Payer: Self-pay | Admitting: Nurse Practitioner

## 2019-09-08 DIAGNOSIS — F411 Generalized anxiety disorder: Secondary | ICD-10-CM

## 2019-09-08 DIAGNOSIS — R399 Unspecified symptoms and signs involving the genitourinary system: Secondary | ICD-10-CM

## 2019-12-20 ENCOUNTER — Other Ambulatory Visit: Payer: Self-pay | Admitting: Nurse Practitioner

## 2019-12-20 DIAGNOSIS — J0100 Acute maxillary sinusitis, unspecified: Secondary | ICD-10-CM

## 2020-01-08 ENCOUNTER — Other Ambulatory Visit: Payer: Self-pay | Admitting: Nurse Practitioner

## 2020-01-08 DIAGNOSIS — F411 Generalized anxiety disorder: Secondary | ICD-10-CM

## 2020-01-17 ENCOUNTER — Telehealth (INDEPENDENT_AMBULATORY_CARE_PROVIDER_SITE_OTHER): Payer: 59 | Admitting: Family Medicine

## 2020-01-17 ENCOUNTER — Encounter: Payer: Self-pay | Admitting: Family Medicine

## 2020-01-17 DIAGNOSIS — R399 Unspecified symptoms and signs involving the genitourinary system: Secondary | ICD-10-CM

## 2020-01-17 DIAGNOSIS — N3000 Acute cystitis without hematuria: Secondary | ICD-10-CM

## 2020-01-17 MED ORDER — CEPHALEXIN 500 MG PO CAPS: 500.0000 mg | ORAL_CAPSULE | Freq: Two times a day (BID) | ORAL | 0 refills | Status: DC

## 2020-01-17 NOTE — Progress Notes (Signed)
Virtual Visit via MyChart video note  I connected with Jane Howard on 01/17/20 at 1819 by video and verified that I am speaking with the correct person using two identifiers. Jane Howard is currently located at home and patient are currently with her during visit. The provider, Elige Radon Everrett Lacasse, MD is located in their office at time of visit.  Call ended at 1827  I discussed the limitations, risks, security and privacy concerns of performing an evaluation and management service by video and the availability of in person appointments. I also discussed with the patient that there may be a patient responsible charge related to this service. The patient expressed understanding and agreed to proceed.   History and Present Illness: patient is calling in for urinary symptoms over the past 2 days, dysuria and frequency and has used azo.  She denies any fevers or chills or flank pain.  She denies any pain but does have bladder pressure.  She gets these frequently and she had urologist. She denies any vaginal symptoms.  She is trying to drink more water  1. Acute cystitis without hematuria   2. UTI symptoms     Outpatient Encounter Medications as of 01/17/2020  Medication Sig  . allopurinol (ZYLOPRIM) 100 MG tablet Take 1 tablet (100 mg total) by mouth daily.  Marland Kitchen aspirin EC 81 MG tablet Take 81 mg by mouth daily.  Marland Kitchen atorvastatin (LIPITOR) 20 MG tablet Take 1 tablet (20 mg total) by mouth daily.  . cephALEXin (KEFLEX) 500 MG capsule Take 1 capsule (500 mg total) by mouth 2 (two) times daily.  . clonazePAM (KLONOPIN) 0.5 MG tablet Take 1 tablet (0.5 mg total) by mouth 2 (two) times daily as needed for anxiety.  Marland Kitchen escitalopram (LEXAPRO) 10 MG tablet TAKE 1 TABLET BY MOUTH EVERY DAY  . fenofibrate (TRICOR) 145 MG tablet Take 1 tablet (145 mg total) by mouth daily.  . fluconazole (DIFLUCAN) 150 MG tablet Take 1 tablet (150 mg total) by mouth every three (3) days as needed.  . fluticasone  (FLONASE) 50 MCG/ACT nasal spray PLACE 1 SPRAY INTO BOTH NOSTRILS 2 (TWO) TIMES DAILY AS NEEDED FOR ALLERGIES OR RHINITIS.  . furosemide (LASIX) 20 MG tablet Take 1 tablet (20 mg total) by mouth daily.  Marland Kitchen lisinopril (ZESTRIL) 40 MG tablet Take 1 tablet (40 mg total) by mouth daily.  . Multiple Vitamin (MULTI-DAY PO) Take 1 tablet by mouth daily. L-Lysine  . omeprazole (PRILOSEC) 40 MG capsule TAKE 1 CAPSULE BY MOUTH EVERY DAY  . [DISCONTINUED] cephALEXin (KEFLEX) 500 MG capsule Take 1 capsule (500 mg total) by mouth 2 (two) times daily.   No facility-administered encounter medications on file as of 01/17/2020.    Review of Systems  Constitutional: Negative for chills and fever.  Eyes: Negative for redness and visual disturbance.  Respiratory: Negative for chest tightness and shortness of breath.   Cardiovascular: Negative for chest pain and leg swelling.  Gastrointestinal: Negative for abdominal distention and abdominal pain.  Genitourinary: Positive for dysuria, frequency and urgency. Negative for difficulty urinating, flank pain, hematuria, vaginal bleeding, vaginal discharge and vaginal pain.  Musculoskeletal: Negative for back pain and gait problem.  Skin: Negative for rash.  Neurological: Negative for light-headedness and headaches.  Psychiatric/Behavioral: Negative for agitation and behavioral problems.  All other systems reviewed and are negative.   Observations/Objective: Patient sounds comfortable and in no acute distress, she looks comfortable.  Assessment and Plan: Problem List Items Addressed This Visit  None    Visit Diagnoses    Acute cystitis without hematuria    -  Primary   Relevant Medications   cephALEXin (KEFLEX) 500 MG capsule   UTI symptoms       Relevant Medications   cephALEXin (KEFLEX) 500 MG capsule       Follow up plan: Return if symptoms worsen or fail to improve.     I discussed the assessment and treatment plan with the patient. The patient  was provided an opportunity to ask questions and all were answered. The patient agreed with the plan and demonstrated an understanding of the instructions.   The patient was advised to call back or seek an in-person evaluation if the symptoms worsen or if the condition fails to improve as anticipated.  The above assessment and management plan was discussed with the patient. The patient verbalized understanding of and has agreed to the management plan. Patient is aware to call the clinic if symptoms persist or worsen. Patient is aware when to return to the clinic for a follow-up visit. Patient educated on when it is appropriate to go to the emergency department.    I provided 8 minutes of non-face-to-face time during this encounter.    Nils Pyle, MD

## 2020-02-06 ENCOUNTER — Encounter: Payer: Self-pay | Admitting: Nurse Practitioner

## 2020-02-06 ENCOUNTER — Other Ambulatory Visit: Payer: Self-pay

## 2020-02-06 ENCOUNTER — Ambulatory Visit (INDEPENDENT_AMBULATORY_CARE_PROVIDER_SITE_OTHER): Payer: 59 | Admitting: Nurse Practitioner

## 2020-02-06 VITALS — BP 120/73 | HR 77 | Temp 96.6°F | Resp 20 | Ht 66.0 in | Wt 204.0 lb

## 2020-02-06 DIAGNOSIS — F411 Generalized anxiety disorder: Secondary | ICD-10-CM | POA: Diagnosis not present

## 2020-02-06 DIAGNOSIS — K219 Gastro-esophageal reflux disease without esophagitis: Secondary | ICD-10-CM | POA: Insufficient documentation

## 2020-02-06 DIAGNOSIS — I10 Essential (primary) hypertension: Secondary | ICD-10-CM

## 2020-02-06 DIAGNOSIS — M1A9XX0 Chronic gout, unspecified, without tophus (tophi): Secondary | ICD-10-CM | POA: Diagnosis not present

## 2020-02-06 DIAGNOSIS — E782 Mixed hyperlipidemia: Secondary | ICD-10-CM | POA: Diagnosis not present

## 2020-02-06 DIAGNOSIS — E559 Vitamin D deficiency, unspecified: Secondary | ICD-10-CM

## 2020-02-06 DIAGNOSIS — R609 Edema, unspecified: Secondary | ICD-10-CM

## 2020-02-06 DIAGNOSIS — N951 Menopausal and female climacteric states: Secondary | ICD-10-CM

## 2020-02-06 DIAGNOSIS — Z87440 Personal history of urinary (tract) infections: Secondary | ICD-10-CM

## 2020-02-06 DIAGNOSIS — E669 Obesity, unspecified: Secondary | ICD-10-CM

## 2020-02-06 LAB — URINALYSIS, COMPLETE
Bilirubin, UA: NEGATIVE
Glucose, UA: NEGATIVE
Ketones, UA: NEGATIVE
Nitrite, UA: NEGATIVE
Protein,UA: NEGATIVE
RBC, UA: NEGATIVE
Specific Gravity, UA: 1.025 (ref 1.005–1.030)
Urobilinogen, Ur: 0.2 mg/dL (ref 0.2–1.0)
pH, UA: 5.5 (ref 5.0–7.5)

## 2020-02-06 LAB — MICROSCOPIC EXAMINATION: RBC, Urine: NONE SEEN /hpf (ref 0–2)

## 2020-02-06 MED ORDER — ESCITALOPRAM OXALATE 10 MG PO TABS: 10.0000 mg | ORAL_TABLET | Freq: Every day | ORAL | 1 refills | Status: DC

## 2020-02-06 MED ORDER — FUROSEMIDE 20 MG PO TABS: 20.0000 mg | ORAL_TABLET | Freq: Every day | ORAL | 1 refills | Status: DC

## 2020-02-06 MED ORDER — CLONAZEPAM 0.5 MG PO TABS: 0.5000 mg | ORAL_TABLET | Freq: Two times a day (BID) | ORAL | 5 refills | Status: DC | PRN

## 2020-02-06 MED ORDER — ESTRADIOL 0.025 MG/24HR TD PTWK: 0.0250 mg | MEDICATED_PATCH | TRANSDERMAL | 12 refills | Status: DC

## 2020-02-06 MED ORDER — ATORVASTATIN CALCIUM 20 MG PO TABS: 20.0000 mg | ORAL_TABLET | Freq: Every day | ORAL | 1 refills | Status: DC

## 2020-02-06 MED ORDER — ALLOPURINOL 100 MG PO TABS: 100.0000 mg | ORAL_TABLET | Freq: Every day | ORAL | 1 refills | Status: DC

## 2020-02-06 MED ORDER — OMEPRAZOLE 40 MG PO CPDR: DELAYED_RELEASE_CAPSULE | ORAL | 1 refills | Status: DC

## 2020-02-06 MED ORDER — LISINOPRIL 40 MG PO TABS: 40.0000 mg | ORAL_TABLET | Freq: Every day | ORAL | 3 refills | Status: DC

## 2020-02-06 MED ORDER — FENOFIBRATE 145 MG PO TABS: 145.0000 mg | ORAL_TABLET | Freq: Every day | ORAL | 1 refills | Status: DC

## 2020-02-06 NOTE — Progress Notes (Signed)
Subjective:    Patient ID: Jane Howard, female    DOB: 1955-10-03, 64 y.o.   MRN: [id number]   Chief Complaint: Medical Management of Chronic Issues    HPI:  1. Essential hypertension, benign No c/o chest pain, sob or headache. Does not check blood pressure at home. BP Readings from Last 3 Encounters:  02/06/20 120/73  08/04/19 124/66  02/24/19 126/72    .  2. Mixed hyperlipidemia Does not watch diet and does no exercise Lab Results  Component Value Date   CHOL 187 08/04/2019   HDL 52 08/04/2019   LDLCALC 99 08/04/2019   TRIG 209 (H) 08/04/2019   CHOLHDL 3.6 08/04/2019     3. GAD (generalized anxiety disorder) Takes lexapro and klonopin and is doing well. GAD 7 : Generalized Anxiety Score 02/06/2020 08/04/2019 11/15/2018  Nervous, Anxious, on Edge 1 1 0  Control/stop worrying 1 1 0  Worry too much - different things 0 0 0  Trouble relaxing 1 1 0  Restless 1 1 0  Easily annoyed or irritable 0 1 0  Afraid - awful might happen 0 0 0  Total GAD 7 Score 4 5 0  Anxiety Difficulty Not difficult at all Somewhat difficult Not difficult at all      4. Chronic gout without tophus, unspecified cause, unspecified site No flare up since last visit  5. Vitamin D deficiency Takes a daily vitamin d supplement  6. Obesity (BMI 30-39.9) No recent weight changes Wt Readings from Last 3 Encounters:  02/06/20 204 lb (92.5 kg)  08/04/19 204 lb 4 oz (92.6 kg)  02/24/19 213 lb (96.6 kg)   BMI Readings from Last 3 Encounters:  02/06/20 32.93 kg/m  08/04/19 32.97 kg/m  02/24/19 34.38 kg/m       Outpatient Encounter Medications as of 02/06/2020  Medication Sig  . allopurinol (ZYLOPRIM) 100 MG tablet Take 1 tablet (100 mg total) by mouth daily.  Marland Kitchen aspirin EC 81 MG tablet Take 81 mg by mouth daily.  Marland Kitchen atorvastatin (LIPITOR) 20 MG tablet Take 1 tablet (20 mg total) by mouth daily.  . clonazePAM (KLONOPIN) 0.5 MG tablet Take 1 tablet (0.5 mg total) by mouth 2 (two)  times daily as needed for anxiety.  Marland Kitchen escitalopram (LEXAPRO) 10 MG tablet TAKE 1 TABLET BY MOUTH EVERY DAY  . fenofibrate (TRICOR) 145 MG tablet Take 1 tablet (145 mg total) by mouth daily.  . fluticasone (FLONASE) 50 MCG/ACT nasal spray PLACE 1 SPRAY INTO BOTH NOSTRILS 2 (TWO) TIMES DAILY AS NEEDED FOR ALLERGIES OR RHINITIS.  . furosemide (LASIX) 20 MG tablet Take 1 tablet (20 mg total) by mouth daily.  Marland Kitchen lisinopril (ZESTRIL) 40 MG tablet Take 1 tablet (40 mg total) by mouth daily.  . Multiple Vitamin (MULTI-DAY PO) Take 1 tablet by mouth daily. L-Lysine  . omeprazole (PRILOSEC) 40 MG capsule TAKE 1 CAPSULE BY MOUTH EVERY DAY     Past Surgical History:  Procedure Laterality Date  . ABDOMINAL HYSTERECTOMY    . FOOT SURGERY Bilateral   . NASAL SEPTUM SURGERY    . TONSILLECTOMY      Family History  Problem Relation Age of Onset  . Heart disease Mother   . Cancer Father        prostate  . Heart disease Father     New complaints: Patient has frequent UTI. She thinks it is coming from low estrogen and she would like that checked.   Social history: Lives with her husband  Controlled substance contract: n/a    Review of Systems  Constitutional: Negative for diaphoresis.  Eyes: Negative for pain.  Respiratory: Negative for shortness of breath.   Cardiovascular: Negative for chest pain, palpitations and leg swelling.  Gastrointestinal: Negative for abdominal pain.  Endocrine: Negative for polydipsia.  Skin: Negative for rash.  Neurological: Negative for dizziness, weakness and headaches.  Hematological: Does not bruise/bleed easily.  All other systems reviewed and are negative.      Objective:   Physical Exam Vitals and nursing note reviewed.  Constitutional:      General: She is not in acute distress.    Appearance: Normal appearance. She is well-developed and well-nourished.  HENT:     Head: Normocephalic.     Nose: Nose normal.     Mouth/Throat:     Mouth:  Oropharynx is clear and moist.  Eyes:     Extraocular Movements: EOM normal.     Pupils: Pupils are equal, round, and reactive to light.  Neck:     Vascular: No carotid bruit or JVD.  Cardiovascular:     Rate and Rhythm: Normal rate and regular rhythm.     Pulses: Intact distal pulses.     Heart sounds: Normal heart sounds.  Pulmonary:     Effort: Pulmonary effort is normal. No respiratory distress.     Breath sounds: Normal breath sounds. No wheezing or rales.  Chest:     Chest wall: No tenderness.  Abdominal:     General: Bowel sounds are normal. There is no distension or abdominal bruit. Aorta is normal.     Palpations: Abdomen is soft. There is no hepatomegaly, splenomegaly, mass or pulsatile mass.     Tenderness: There is no abdominal tenderness.  Musculoskeletal:        General: No edema. Normal range of motion.     Cervical back: Normal range of motion and neck supple.  Lymphadenopathy:     Cervical: No cervical adenopathy.  Skin:    General: Skin is warm and dry.  Neurological:     Mental Status: She is alert and oriented to person, place, and time.     Deep Tendon Reflexes: Reflexes are normal and symmetric.  Psychiatric:        Mood and Affect: Mood and affect normal.        Behavior: Behavior normal.        Thought Content: Thought content normal.        Judgment: Judgment normal.     BP 120/73   Pulse 77   Temp (!) 96.6 F (35.9 C) (Temporal)   Resp 20   Ht '5\' 6"'  (1.676 m)   Wt 204 lb (92.5 kg)   SpO2 98%   BMI 32.93 kg/m        Assessment & Plan:  Jane Howard comes in today with chief complaint of Medical Management of Chronic Issues   Diagnosis and orders addressed:  1. Essential hypertension, benign Low sodium diet - lisinopril (ZESTRIL) 40 MG tablet; Take 1 tablet (40 mg total) by mouth daily.  Dispense: 90 tablet; Refill: 3 - CBC with Differential/Platelet - CMP14+EGFR  2. Mixed hyperlipidemia Low fat diet - atorvastatin (LIPITOR)  20 MG tablet; Take 1 tablet (20 mg total) by mouth daily.  Dispense: 90 tablet; Refill: 1 - fenofibrate (TRICOR) 145 MG tablet; Take 1 tablet (145 mg total) by mouth daily.  Dispense: 90 tablet; Refill: 1 - Lipid panel  3. GAD (generalized anxiety disorder) Stress management -  escitalopram (LEXAPRO) 10 MG tablet; Take 1 tablet (10 mg total) by mouth daily.  Dispense: 90 tablet; Refill: 1 - clonazePAM (KLONOPIN) 0.5 MG tablet; Take 1 tablet (0.5 mg total) by mouth 2 (two) times daily as needed for anxiety.  Dispense: 60 tablet; Refill: 5  4. Chronic gout without tophus, unspecified cause, unspecified site - allopurinol (ZYLOPRIM) 100 MG tablet; Take 1 tablet (100 mg total) by mouth daily.  Dispense: 90 tablet; Refill: 1  5. Vitamin D deficiency Continue daily vitamin d supplement  6. Obesity (BMI 30-39.9) Discussed diet and exercise for person with BMI >25 Will recheck weight in 3-6 months  7. Peripheral edema Elevate legs when sitting - furosemide (LASIX) 20 MG tablet; Take 1 tablet (20 mg total) by mouth daily.  Dispense: 90 tablet; Refill: 1  8. Menopausal symptoms Patient wants to try estrogen- side effects were discussed Possible complications were discussed including breast cancer risk. - estradiol (CLIMARA - DOSED IN MG/24 HR) 0.025 mg/24hr patch; Place 1 patch (0.025 mg total) onto the skin once a week.  Dispense: 4 patch; Refill: 12  9. Hx: UTI (urinary tract infection) Try cranberry pills OTC - Urinalysis, Complete  10. Gastroesophageal reflux disease without esophagitis Avoid spicy foods Do not eat 2 hours prior to bedtime - omeprazole (PRILOSEC) 40 MG capsule; TAKE 1 CAPSULE BY MOUTH EVERY DAY  Dispense: 90 capsule; Refill: 1   Labs pending Health Maintenance reviewed- patient will schedule mammogram Diet and exercise encouraged  Follow up plan: 6 months   Mary-Margaret Hassell Done, FNP

## 2020-02-06 NOTE — Patient Instructions (Signed)
Menopause Menopause is the normal time of life when menstrual periods stop completely. It is usually confirmed by 12 months without a menstrual period. The transition to menopause (perimenopause) most often happens between the ages of 45 and 55. During perimenopause, hormone levels change in your body, which can cause symptoms and affect your health. Menopause may increase your risk for:  Loss of bone (osteoporosis), which causes bone breaks (fractures).  Depression.  Hardening and narrowing of the arteries (atherosclerosis), which can cause heart attacks and strokes. What are the causes? This condition is usually caused by a natural change in hormone levels that happens as you get older. The condition may also be caused by surgery to remove both ovaries (bilateral oophorectomy). What increases the risk? This condition is more likely to start at an earlier age if you have certain medical conditions or treatments, including:  A tumor of the pituitary gland in the brain.  A disease that affects the ovaries and hormone production.  Radiation treatment for cancer.  Certain cancer treatments, such as chemotherapy or hormone (anti-estrogen) therapy.  Heavy smoking and excessive alcohol use.  Family history of early menopause. This condition is also more likely to develop earlier in women who are very thin. What are the signs or symptoms? Symptoms of this condition include:  Hot flashes.  Irregular menstrual periods.  Night sweats.  Changes in feelings about sex. This could be a decrease in sex drive or an increased comfort around your sexuality.  Vaginal dryness and thinning of the vaginal walls. This may cause painful intercourse.  Dryness of the skin and development of wrinkles.  Headaches.  Problems sleeping (insomnia).  Mood swings or irritability.  Memory problems.  Weight gain.  Hair growth on the face and chest.  Bladder infections or problems with urinating. How  is this diagnosed? This condition is diagnosed based on your medical history, a physical exam, your age, your menstrual history, and your symptoms. Hormone tests may also be done. How is this treated? In some cases, no treatment is needed. You and your health care provider should make a decision together about whether treatment is necessary. Treatment will be based on your individual condition and preferences. Treatment for this condition focuses on managing symptoms. Treatment may include:  Menopausal hormone therapy (MHT).  Medicines to treat specific symptoms or complications.  Acupuncture.  Vitamin or herbal supplements. Before starting treatment, make sure to let your health care provider know if you have a personal or family history of:  Heart disease.  Breast cancer.  Blood clots.  Diabetes.  Osteoporosis. Follow these instructions at home: Lifestyle  Do not use any products that contain nicotine or tobacco, such as cigarettes and e-cigarettes. If you need help quitting, ask your health care provider.  Get at least 30 minutes of physical activity on 5 or more days each week.  Avoid alcoholic and caffeinated beverages, as well as spicy foods. This may help prevent hot flashes.  Get 7-8 hours of sleep each night.  If you have hot flashes, try: ? Dressing in layers. ? Avoiding things that may trigger hot flashes, such as spicy food, warm places, or stress. ? Taking slow, deep breaths when a hot flash starts. ? Keeping a fan in your home and office.  Find ways to manage stress, such as deep breathing, meditation, or journaling.  Consider going to group therapy with other women who are having menopause symptoms. Ask your health care provider about recommended group therapy meetings. Eating and   drinking  Eat a healthy, balanced diet that contains whole grains, lean protein, low-fat dairy, and plenty of fruits and vegetables.  Your health care provider may recommend  adding more soy to your diet. Foods that contain soy include tofu, tempeh, and soy milk.  Eat plenty of foods that contain calcium and vitamin D for bone health. Items that are rich in calcium include low-fat milk, yogurt, beans, almonds, sardines, broccoli, and kale. Medicines  Take over-the-counter and prescription medicines only as told by your health care provider.  Talk with your health care provider before starting any herbal supplements. If prescribed, take vitamins and supplements as told by your health care provider. These may include: ? Calcium. Women age 51 and older should get 1,200 mg (milligrams) of calcium every day. ? Vitamin D. Women need 600-800 International Units of vitamin D each day. ? Vitamins B12 and B6. Aim for 50 micrograms of B12 and 1.5 mg of B6 each day. General instructions  Keep track of your menstrual periods, including: ? When they occur. ? How heavy they are and how long they last. ? How much time passes between periods.  Keep track of your symptoms, noting when they start, how often you have them, and how long they last.  Use vaginal lubricants or moisturizers to help with vaginal dryness and improve comfort during sex.  Keep all follow-up visits as told by your health care provider. This is important. This includes any group therapy or counseling. Contact a health care provider if:  You are still having menstrual periods after age 55.  You have pain during sex.  You have not had a period for 12 months and you develop vaginal bleeding. Get help right away if:  You have: ? Severe depression. ? Excessive vaginal bleeding. ? Pain when you urinate. ? A fast or irregular heart beat (palpitations). ? Severe headaches. ? Abdomen (abdominal) pain or severe indigestion.  You fell and you think you have a broken bone.  You develop leg or chest pain.  You develop vision problems.  You feel a lump in your breast. Summary  Menopause is the normal  time of life when menstrual periods stop completely. It is usually confirmed by 12 months without a menstrual period.  The transition to menopause (perimenopause) most often happens between the ages of 45 and 55.  Symptoms can be managed through medicines, lifestyle changes, and complementary therapies such as acupuncture.  Eat a balanced diet that is rich in nutrients to promote bone health and heart health and to manage symptoms during menopause. This information is not intended to replace advice given to you by your health care provider. Make sure you discuss any questions you have with your health care provider. Document Revised: 01/08/2017 Document Reviewed: 02/29/2016 Elsevier Patient Education  2020 [employer]  

## 2020-02-26 ENCOUNTER — Encounter: Payer: 59 | Admitting: Obstetrics and Gynecology

## 2020-03-08 ENCOUNTER — Other Ambulatory Visit: Payer: Self-pay

## 2020-03-08 ENCOUNTER — Ambulatory Visit: Payer: 59 | Admitting: Obstetrics and Gynecology

## 2020-03-08 ENCOUNTER — Encounter: Payer: Self-pay | Admitting: Obstetrics and Gynecology

## 2020-03-08 ENCOUNTER — Other Ambulatory Visit: Payer: Self-pay | Admitting: Nurse Practitioner

## 2020-03-08 VITALS — BP 122/74 | HR 90 | Resp 14 | Ht 65.5 in | Wt 204.5 lb

## 2020-03-08 DIAGNOSIS — Z01419 Encounter for gynecological examination (general) (routine) without abnormal findings: Secondary | ICD-10-CM | POA: Diagnosis not present

## 2020-03-08 DIAGNOSIS — B372 Candidiasis of skin and nail: Secondary | ICD-10-CM | POA: Diagnosis not present

## 2020-03-08 DIAGNOSIS — N8110 Cystocele, unspecified: Secondary | ICD-10-CM

## 2020-03-08 DIAGNOSIS — Z1231 Encounter for screening mammogram for malignant neoplasm of breast: Secondary | ICD-10-CM

## 2020-03-08 MED ORDER — NYSTATIN 100000 UNIT/GM EX POWD: 1.0000 "application " | Freq: Three times a day (TID) | CUTANEOUS | 2 refills | Status: DC

## 2020-03-08 NOTE — Progress Notes (Signed)
65 y.o. G2P2 Married Caucasian female here for annual exam.    On ERT, started 5 weeks ago.  She was having increased hot flashes.   Having UTIs and yeast infections.  Has a rash in her groin area often.   Not sexually active for a few years.   Status post vaginal hysterectomy and anterior colporrhaphy in 2000 by Dr. Nicholas Lose.  Thinks she is having prolapse of the rectum.  She is having to do extra hygiene care.   Retired 2 years ago. Helps take care of her mother.   PCP:   Bennie Pierini, FNP  No LMP recorded. Patient has had a hysterectomy.           Sexually active: No.  The current method of family planning is status post hysterectomy.   Had prolapse.  Exercising: No.  The patient does not participate in regular exercise at present. Smoker:  no  Health Maintenance: Pap:  5-6 years ago Nestor Ramp History of abnormal Pap:  yes MMG:  02-21-14 density B/BIRADS 1 negative  Colonoscopy:  09-2019 at [facility]oscopy: polyps, 5 year f/u BMD:   02-21-14  Result  Normal  TDaP:  2011 Gardasil:   no HIV: donated blood in the past Hep C: donated blood in the past  Screening Labs:  Hb today: PCP, Urine today: not collected   reports that she has never smoked. She has never used smokeless tobacco. She reports that she does not drink alcohol and does not use drugs.  Past Medical History:  Diagnosis Date  . Anxiety   . Nephrolithiasis     Past Surgical History:  Procedure Laterality Date  . ABDOMINAL HYSTERECTOMY    . FOOT SURGERY Bilateral   . NASAL SEPTUM SURGERY    . TONSILLECTOMY      Current Outpatient Medications  Medication Sig Dispense Refill  . allopurinol (ZYLOPRIM) 100 MG tablet Take 1 tablet (100 mg total) by mouth daily. 90 tablet 1  . aspirin EC 81 MG tablet Take 81 mg by mouth daily.    Marland Kitchen atorvastatin (LIPITOR) 20 MG tablet Take 1 tablet (20 mg total) by mouth daily. 90 tablet 1  . clonazePAM (KLONOPIN) 0.5 MG tablet Take 1 tablet (0.5 mg total) by  mouth 2 (two) times daily as needed for anxiety. 60 tablet 5  . escitalopram (LEXAPRO) 10 MG tablet Take 1 tablet (10 mg total) by mouth daily. 90 tablet 1  . estradiol (CLIMARA - DOSED IN MG/24 HR) 0.025 mg/24hr patch Place 1 patch (0.025 mg total) onto the skin once a week. 4 patch 12  . fenofibrate (TRICOR) 145 MG tablet Take 1 tablet (145 mg total) by mouth daily. 90 tablet 1  . fluticasone (FLONASE) 50 MCG/ACT nasal spray PLACE 1 SPRAY INTO BOTH NOSTRILS 2 (TWO) TIMES DAILY AS NEEDED FOR ALLERGIES OR RHINITIS. 48 mL 1  . furosemide (LASIX) 20 MG tablet Take 1 tablet (20 mg total) by mouth daily. (Patient taking differently: Take 20 mg by mouth as needed.) 90 tablet 1  . lisinopril (ZESTRIL) 40 MG tablet Take 1 tablet (40 mg total) by mouth daily. 90 tablet 3  . Multiple Vitamin (MULTI-DAY PO) Take 1 tablet by mouth daily. L-Lysine    . Probiotic Product (PROBIOTIC PO)      No current facility-administered medications for this visit.    Family History  Problem Relation Age of Onset  . Heart disease Mother   . Kidney disease Mother   . Cancer Father  prostate  . Heart disease Father   . Kidney disease Father     Review of Systems  Genitourinary:       Multiple yeast and UTI's the last year Rash in groin- not visible today ? Bowel prolapse  All other systems reviewed and are negative.   Exam:   BP 122/74 (BP Location: Right Arm, Patient Position: Sitting, Cuff Size: Large)   Pulse 90   Resp 14   Ht 5' 5.5" (1.664 m)   Wt 204 lb 8 oz (92.8 kg)   BMI 33.51 kg/m     General appearance: alert, cooperative and appears stated age Head: normocephalic, without obvious abnormality, atraumatic Neck: no adenopathy, supple, symmetrical, trachea midline and thyroid normal to inspection and palpation Lungs: clear to auscultation bilaterally Breasts: normal appearance, no masses or tenderness, No nipple retraction or dimpling, No nipple discharge or bleeding, No axillary  adenopathy Heart: regular rate and rhythm Abdomen: soft, non-tender; no masses, no organomegaly Extremities: extremities normal, atraumatic, no cyanosis or edema Skin: skin color, texture, turgor normal.  Small plaques of erythema in flexural fold in groin.  Lymph nodes: cervical, supraclavicular, and axillary nodes normal. Neurologic: grossly normal  Pelvic: External genitalia:  no lesions              No abnormal inguinal nodes palpated.              Urethra:  normal appearing urethra with no masses, tenderness or lesions.  Urethra is well supported.              Bartholins and Skenes: normal                 Vagina: normal appearing vagina with normal color and discharge, no lesions.  Second degree cystocele.                Cervix: absent              Pap taken: No. Bimanual Exam:  Uterus:  absent              Adnexa: no mass, fullness, tenderness              Rectal exam: Yes.  .  Confirms.              Anus:  normal sphincter tone, no lesions  Chaperone was present for exam.  Assessment:   Well woman visit with normal exam. Status post TVH/anterior colporrhaphy.  Cystocele. Recurrent UTI.  I suspect this is due to her cystocele and atrophy.  Candida of flexural fold.   Plan: Mammogram screening discussed. She will schedule.  Facilities discussed.  Self breast awareness reviewed. Pap and HR HPV as above. Guidelines for Calcium, Vitamin D, regular exercise program including cardiovascular and weight bearing exercise. We discussed pessary versus surgery to correct the cystocele.   She would like to return for pessary fitting. We reviewed ERT versus local vaginal estrogen treatment for recurrent UTI.  I recommend discontinuation of the transdermal estrogen and start vaginal estrogen cream after her mammogram is back and normal.   We reviewed ERT and risk of stroke, PE, and DVT.  Nystatin powder. Follow up annually and prn.

## 2020-03-08 NOTE — Patient Instructions (Signed)

## 2020-03-11 NOTE — Progress Notes (Unsigned)
GYNECOLOGY  VISIT   HPI: 65 y.o.   Married  Caucasian  female   G2P2 with No LMP recorded. Patient has had a hysterectomy.   here for pessary fitting.    Occasional leakage of urine with a sneeze.  Not emptying well.   Stopped her transdermal estrogen.   Not sexually active.   Patient is taking care of her mother right now.  She takes her to doctor's appointments.   GYNECOLOGIC HISTORY: No LMP recorded. Patient has had a hysterectomy. Contraception:  Hyst Menopausal hormone therapy: Climara patch Last mammogram: 02-21-14 density B/BIRADS 1 negative --appt. 04-23-20 Last pap smear: 5-6 years ago at [facility]        OB History    Gravida  2   Para  2   Term      Preterm      AB      Living        SAB      IAB      Ectopic      Multiple      Live Births                 Patient Active Problem List   Diagnosis Date Noted  . Gastroesophageal reflux disease without esophagitis 02/06/2020  . Vitamin D deficiency 12/01/2016  . Hyperlipidemia 11/27/2016  . Obesity (BMI 30-39.9) 11/06/2015  . Gout 11/17/2013  . Essential hypertension, benign 11/17/2013  . GAD (generalized anxiety disorder) 11/17/2013    Past Medical History:  Diagnosis Date  . Anxiety   . Nephrolithiasis     Past Surgical History:  Procedure Laterality Date  . ABDOMINAL HYSTERECTOMY    . FOOT SURGERY Bilateral   . NASAL SEPTUM SURGERY    . TONSILLECTOMY      Current Outpatient Medications  Medication Sig Dispense Refill  . allopurinol (ZYLOPRIM) 100 MG tablet Take 1 tablet (100 mg total) by mouth daily. 90 tablet 1  . aspirin EC 81 MG tablet Take 81 mg by mouth daily.    Marland Kitchen atorvastatin (LIPITOR) 20 MG tablet Take 1 tablet (20 mg total) by mouth daily. 90 tablet 1  . clonazePAM (KLONOPIN) 0.5 MG tablet Take 1 tablet (0.5 mg total) by mouth 2 (two) times daily as needed for anxiety. 60 tablet 5  . escitalopram (LEXAPRO) 10 MG tablet Take 1 tablet (10 mg total) by mouth daily. 90  tablet 1  . fenofibrate (TRICOR) 145 MG tablet Take 1 tablet (145 mg total) by mouth daily. 90 tablet 1  . fluticasone (FLONASE) 50 MCG/ACT nasal spray PLACE 1 SPRAY INTO BOTH NOSTRILS 2 (TWO) TIMES DAILY AS NEEDED FOR ALLERGIES OR RHINITIS. 48 mL 1  . furosemide (LASIX) 20 MG tablet Take 1 tablet (20 mg total) by mouth daily. (Patient taking differently: Take 20 mg by mouth as needed.) 90 tablet 1  . lisinopril (ZESTRIL) 40 MG tablet Take 1 tablet (40 mg total) by mouth daily. 90 tablet 3  . Multiple Vitamin (MULTI-DAY PO) Take 1 tablet by mouth daily. L-Lysine    . nystatin (MYCOSTATIN/NYSTOP) powder Apply 1 application topically 3 (three) times daily. Apply to affected area for up to 7 days 15 g 2  . Probiotic Product (PROBIOTIC PO)      No current facility-administered medications for this visit.     ALLERGIES: Macrodantin [nitrofurantoin macrocrystal] and Vicodin [hydrocodone-acetaminophen]  Family History  Problem Relation Age of Onset  . Heart disease Mother   . Kidney disease Mother   .  Cancer Father        prostate  . Heart disease Father   . Kidney disease Father     Social History   Socioeconomic History  . Marital status: Married    Spouse name: Not on file  . Number of children: Not on file  . Years of education: Not on file  . Highest education level: Not on file  Occupational History  . Not on file  Tobacco Use  . Smoking status: Never Smoker  . Smokeless tobacco: Never Used  Vaping Use  . Vaping Use: Never used  Substance and Sexual Activity  . Alcohol use: No  . Drug use: No  . Sexual activity: Yes    Birth control/protection: Surgical    Comment: hysterectomy  Other Topics Concern  . Not on file  Social History Narrative  . Not on file   Social Determinants of Health   Financial Resource Strain: Not on file  Food Insecurity: Not on file  Transportation Needs: Not on file  Physical Activity: Not on file  Stress: Not on file  Social  Connections: Not on file  Intimate Partner Violence: Not on file    Review of Systems  Gastrointestinal: Abdominal pain:    All other systems reviewed and are negative.   PHYSICAL EXAMINATION:    BP 126/70 (Cuff Size: Large)   Pulse 84   Ht 5' 5.5" (1.664 m)   Wt 205 lb (93 kg)   SpO2 99%   BMI 33.59 kg/m     General appearance: alert, cooperative and appears stated age  Pelvic: External genitalia:  no lesions              Urethra:  normal appearing urethra with no masses, tenderness or lesions              Bartholins and Skenes: normal                 Vagina: normal appearing vagina with normal color and discharge, no lesions.  Second degree cystocele.               Cervix: absent                Bimanual Exam:  Uterus:  absent              Adnexa: no mass, fullness, tenderness   Rings pessaries with support - # 4 and 5 not well maintained. Incontinence dish #4 not well maintained.  Gelhorn 2 1/2 inch good but the stem is too long.   Chaperone was present for exam.  ASSESSMENT  Status post vaginal hysterectomy and anterior colporrhaphy in 2000 by Dr. Nicholas Lose. Cystocele.  Recurrent.  Recurrent UTI.  Recent discontinuation of  transdermal estrogen.   PLAN  Anterior colporrhaphy discussed and need to avoid lifting for 12 weeks post op.  She chooses a pessary for now.  Will order a 2.5 Gelhorn with a short stem for the patient. We discussed pessaries and potential for urinary incontinence, vaginal discharge, and ulceration.  She will update her mammogram.  After mammogram is back and normal, will start vaginal estrogen cream.   33 min total time was spent for this patient encounter, including preparation, face-to-face counseling with the patient, coordination of care, and documentation of the encounter.

## 2020-03-12 ENCOUNTER — Telehealth: Payer: Self-pay | Admitting: Obstetrics and Gynecology

## 2020-03-12 ENCOUNTER — Other Ambulatory Visit: Payer: Self-pay

## 2020-03-12 ENCOUNTER — Ambulatory Visit: Payer: 59 | Admitting: Obstetrics and Gynecology

## 2020-03-12 ENCOUNTER — Encounter: Payer: Self-pay | Admitting: Obstetrics and Gynecology

## 2020-03-12 VITALS — BP 126/70 | HR 84 | Ht 65.5 in | Wt 205.0 lb

## 2020-03-12 DIAGNOSIS — N8110 Cystocele, unspecified: Secondary | ICD-10-CM

## 2020-03-12 NOTE — Telephone Encounter (Signed)
Please order a 2 1/2 inch Gelhorn pessary with a short stem for my patient.   She has a recurrent cystocele.

## 2020-03-13 NOTE — Telephone Encounter (Signed)
Pessary ordered

## 2020-04-01 NOTE — Telephone Encounter (Signed)
Left message to call Noreene Larsson, RN at [facility], [phone]. Pessary received in office, please call to schedule OV w/ Dr. Edward Jolly.

## 2020-04-01 NOTE — Telephone Encounter (Signed)
Spoke with patient.  OV scheduled for 04/29/20 at 4:30pm w/ Dr. Edward Jolly.   Encounter closed.

## 2020-04-23 ENCOUNTER — Ambulatory Visit: Payer: 59

## 2020-04-25 ENCOUNTER — Other Ambulatory Visit: Payer: Self-pay

## 2020-04-25 ENCOUNTER — Encounter: Payer: Self-pay | Admitting: Family Medicine

## 2020-04-25 ENCOUNTER — Ambulatory Visit: Payer: 59 | Admitting: Family Medicine

## 2020-04-25 VITALS — BP 125/68 | HR 74 | Temp 98.2°F | Ht 65.5 in | Wt 206.0 lb

## 2020-04-25 DIAGNOSIS — N3000 Acute cystitis without hematuria: Secondary | ICD-10-CM

## 2020-04-25 LAB — MICROSCOPIC EXAMINATION: WBC, UA: >30 /hpf — AB (ref 0–5)

## 2020-04-25 LAB — URINALYSIS, COMPLETE
Bilirubin, UA: NEGATIVE
Glucose, UA: NEGATIVE
Ketones, UA: NEGATIVE
Nitrite, UA: POSITIVE — AB
Protein,UA: NEGATIVE
Specific Gravity, UA: 1.02 (ref 1.005–1.030)
Urobilinogen, Ur: 0.2 mg/dL (ref 0.2–1.0)
pH, UA: 5.5 (ref 5.0–7.5)

## 2020-04-25 MED ORDER — CEPHALEXIN 500 MG PO CAPS: 500.0000 mg | ORAL_CAPSULE | Freq: Two times a day (BID) | ORAL | 0 refills | Status: AC

## 2020-04-25 MED ORDER — FLUCONAZOLE 150 MG PO TABS: 150.0000 mg | ORAL_TABLET | Freq: Once | ORAL | 0 refills | Status: AC

## 2020-04-25 NOTE — Progress Notes (Signed)
Acute Office Visit  Subjective:    Patient ID: Jane Howard, female    DOB: December 04, 1955, 65 y.o.   MRN: [id number]  Chief Complaint  Patient presents with  . Dysuria    HPI Patient is in today for dysuria x1 pain. She also reports urinary frequency and urgency. She denies fever, chills, flank pain, blood in urine, nausea, vomiting, or abdominal pain. There is a history of previous UTIs. She often gets a yeast infection following antibiotic use.   Past Medical History:  Diagnosis Date  . Anxiety   . Nephrolithiasis     Past Surgical History:  Procedure Laterality Date  . ABDOMINAL HYSTERECTOMY    . FOOT SURGERY Bilateral   . NASAL SEPTUM SURGERY    . TONSILLECTOMY      Family History  Problem Relation Age of Onset  . Heart disease Mother   . Kidney disease Mother   . Cancer Father        prostate  . Heart disease Father   . Kidney disease Father     Social History   Socioeconomic History  . Marital status: Married    Spouse name: Not on file  . Number of children: Not on file  . Years of education: Not on file  . Highest education level: Not on file  Occupational History  . Not on file  Tobacco Use  . Smoking status: Never Smoker  . Smokeless tobacco: Never Used  Vaping Use  . Vaping Use: Never used  Substance and Sexual Activity  . Alcohol use: No  . Drug use: No  . Sexual activity: Yes    Birth control/protection: Surgical    Comment: hysterectomy  Other Topics Concern  . Not on file  Social History Narrative  . Not on file   Social Determinants of Health   Financial Resource Strain: Not on file  Food Insecurity: Not on file  Transportation Needs: Not on file  Physical Activity: Not on file  Stress: Not on file  Social Connections: Not on file  Intimate Partner Violence: Not on file    Outpatient Medications Prior to Visit  Medication Sig Dispense Refill  . allopurinol (ZYLOPRIM) 100 MG tablet Take 1 tablet (100 mg total) by mouth  daily. 90 tablet 1  . aspirin EC 81 MG tablet Take 81 mg by mouth daily.    Marland Kitchen atorvastatin (LIPITOR) 20 MG tablet Take 1 tablet (20 mg total) by mouth daily. 90 tablet 1  . clonazePAM (KLONOPIN) 0.5 MG tablet Take 1 tablet (0.5 mg total) by mouth 2 (two) times daily as needed for anxiety. 60 tablet 5  . escitalopram (LEXAPRO) 10 MG tablet Take 1 tablet (10 mg total) by mouth daily. 90 tablet 1  . fenofibrate (TRICOR) 145 MG tablet Take 1 tablet (145 mg total) by mouth daily. 90 tablet 1  . fluticasone (FLONASE) 50 MCG/ACT nasal spray PLACE 1 SPRAY INTO BOTH NOSTRILS 2 (TWO) TIMES DAILY AS NEEDED FOR ALLERGIES OR RHINITIS. 48 mL 1  . furosemide (LASIX) 20 MG tablet Take 1 tablet (20 mg total) by mouth daily. (Patient taking differently: Take 20 mg by mouth as needed.) 90 tablet 1  . lisinopril (ZESTRIL) 40 MG tablet Take 1 tablet (40 mg total) by mouth daily. 90 tablet 3  . Multiple Vitamin (MULTI-DAY PO) Take 1 tablet by mouth daily. L-Lysine    . nystatin (MYCOSTATIN/NYSTOP) powder Apply 1 application topically 3 (three) times daily. Apply to affected area for up to  7 days 15 g 2  . Probiotic Product (PROBIOTIC PO)      No facility-administered medications prior to visit.    Allergies  Allergen Reactions  . Macrodantin [Nitrofurantoin Macrocrystal]   . Vicodin [Hydrocodone-Acetaminophen]     Review of Systems As per HPI.     Objective:    Physical Exam Vitals and nursing note reviewed.  Constitutional:      General: She is not in acute distress.    Appearance: Normal appearance. She is not ill-appearing, toxic-appearing or diaphoretic.  Cardiovascular:     Rate and Rhythm: Normal rate and regular rhythm.     Heart sounds: Normal heart sounds. No murmur heard.   Pulmonary:     Effort: Pulmonary effort is normal. No respiratory distress.     Breath sounds: Normal breath sounds.  Abdominal:     General: Bowel sounds are normal. There is no distension.     Palpations: Abdomen  is soft.     Tenderness: There is no abdominal tenderness. There is no right CVA tenderness, left CVA tenderness, guarding or rebound.  Skin:    General: Skin is warm and dry.  Neurological:     General: No focal deficit present.     Mental Status: She is alert and oriented to person, place, and time.     BP 125/68   Pulse 74   Temp 98.2 F (36.8 C) (Temporal)   Ht 5' 5.5" (1.664 m)   Wt 206 lb (93.4 kg)   BMI 33.76 kg/m  Wt Readings from Last 3 Encounters:  04/25/20 206 lb (93.4 kg)  03/12/20 205 lb (93 kg)  03/08/20 204 lb 8 oz (92.8 kg)   Urine dipstick shows positive for nitrates and positive for leukocytes.  Micro exam: >30 WBC's per HPF, 0-2 RBC's per HPF and moderate+ bacteria.  Health Maintenance Due  Topic Date Due  . MAMMOGRAM  02/22/2016  . PAP SMEAR-Modifier  01/12/2017    There are no preventive care reminders to display for this patient.   Lab Results  Component Value Date   TSH 1.070 11/27/2016   Lab Results  Component Value Date   WBC 8.6 08/04/2019   HGB 13.6 08/04/2019   HCT 40.1 08/04/2019   MCV 88 08/04/2019   PLT 298 08/04/2019   Lab Results  Component Value Date   NA 139 08/04/2019   K 4.7 08/04/2019   CO2 25 08/04/2019   GLUCOSE 105 (H) 08/04/2019   BUN 27 08/04/2019   CREATININE 1.02 (H) 08/04/2019   BILITOT 0.3 08/04/2019   ALKPHOS 63 08/04/2019   AST 22 08/04/2019   ALT 26 08/04/2019   PROT 7.4 08/04/2019   ALBUMIN 4.4 08/04/2019   CALCIUM 10.1 08/04/2019   ANIONGAP 13 11/16/2017   Lab Results  Component Value Date   CHOL 187 08/04/2019   Lab Results  Component Value Date   HDL 52 08/04/2019   Lab Results  Component Value Date   LDLCALC 99 08/04/2019   Lab Results  Component Value Date   TRIG 209 (H) 08/04/2019   Lab Results  Component Value Date   CHOLHDL 3.6 08/04/2019   Lab Results  Component Value Date   HGBA1C 5.6% 02/01/2013       Assessment & Plan:   Jane Howard was seen today for  dysuria.  Diagnoses and all orders for this visit:  Acute cystitis without hematuria Keflex ordered as below. Diflucan ordered as patient often has yeast infection following antibiotic use. Culture  pending. Return to office for new or worsening symptoms, or if symptoms persist.  -     Urinalysis, Complete -     Urine Culture -     cephALEXin (KEFLEX) 500 MG capsule; Take 1 capsule (500 mg total) by mouth 2 (two) times daily for 7 days. -     fluconazole (DIFLUCAN) 150 MG tablet; Take 1 tablet (150 mg total) by mouth once for 1 dose.  The patient indicates understanding of these issues and agrees with the plan.  Gabriel Earing, FNP

## 2020-04-25 NOTE — Patient Instructions (Signed)
Urinary Tract Infection, Adult  A urinary tract infection (UTI) is an infection of any part of the urinary tract. The urinary tract includes the kidneys, ureters, bladder, and urethra. These organs make, store, and get rid of urine in the body. An upper UTI affects the ureters and kidneys. A lower UTI affects the bladder and urethra. What are the causes? Most urinary tract infections are caused by bacteria in your genital area around your urethra, where urine leaves your body. These bacteria grow and cause inflammation of your urinary tract. What increases the risk? You are more likely to develop this condition if:  You have a urinary catheter that stays in place.  You are not able to control when you urinate or have a bowel movement (incontinence).  You are female and you: ? Use a spermicide or diaphragm for birth control. ? Have low estrogen levels. ? Are pregnant.  You have certain genes that increase your risk.  You are sexually active.  You take antibiotic medicines.  You have a condition that causes your flow of urine to slow down, such as: ? An enlarged prostate, if you are female. ? Blockage in your urethra. ? A kidney stone. ? A nerve condition that affects your bladder control (neurogenic bladder). ? Not getting enough to drink, or not urinating often.  You have certain medical conditions, such as: ? Diabetes. ? A weak disease-fighting system (immunesystem). ? Sickle cell disease. ? Gout. ? Spinal cord injury. What are the signs or symptoms? Symptoms of this condition include:  Needing to urinate right away (urgency).  Frequent urination. This may include small amounts of urine each time you urinate.  Pain or burning with urination.  Blood in the urine.  Urine that smells bad or unusual.  Trouble urinating.  Cloudy urine.  Vaginal discharge, if you are female.  Pain in the abdomen or the lower back. You may also have:  Vomiting or a decreased  appetite.  Confusion.  Irritability or tiredness.  A fever or chills.  Diarrhea. The first symptom in older adults may be confusion. In some cases, they may not have any symptoms until the infection has worsened. How is this diagnosed? This condition is diagnosed based on your medical history and a physical exam. You may also have other tests, including:  Urine tests.  Blood tests.  Tests for STIs (sexually transmitted infections). If you have had more than one UTI, a cystoscopy or imaging studies may be done to determine the cause of the infections. How is this treated? Treatment for this condition includes:  Antibiotic medicine.  Over-the-counter medicines to treat discomfort.  Drinking enough water to stay hydrated. If you have frequent infections or have other conditions such as a kidney stone, you may need to see a health care provider who specializes in the urinary tract (urologist). In rare cases, urinary tract infections can cause sepsis. Sepsis is a life-threatening condition that occurs when the body responds to an infection. Sepsis is treated in the hospital with IV antibiotics, fluids, and other medicines. Follow these instructions at home: Medicines  Take over-the-counter and prescription medicines only as told by your health care provider.  If you were prescribed an antibiotic medicine, take it as told by your health care provider. Do not stop using the antibiotic even if you start to feel better. General instructions  Make sure you: ? Empty your bladder often and completely. Do not hold urine for long periods of time. ? Empty your bladder after   sex. ? Wipe from front to back after urinating or having a bowel movement if you are female. Use each tissue only one time when you wipe.  Drink enough fluid to keep your urine pale yellow.  Keep all follow-up visits. This is important.   Contact a health care provider if:  Your symptoms do not get better after 1-2  days.  Your symptoms go away and then return. Get help right away if:  You have severe pain in your back or your lower abdomen.  You have a fever or chills.  You have nausea or vomiting. Summary  A urinary tract infection (UTI) is an infection of any part of the urinary tract, which includes the kidneys, ureters, bladder, and urethra.  Most urinary tract infections are caused by bacteria in your genital area.  Treatment for this condition often includes antibiotic medicines.  If you were prescribed an antibiotic medicine, take it as told by your health care provider. Do not stop using the antibiotic even if you start to feel better.  Keep all follow-up visits. This is important. This information is not intended to replace advice given to you by your health care provider. Make sure you discuss any questions you have with your health care provider. Document Revised: 09/08/2019 Document Reviewed: 09/08/2019 Elsevier Patient Education  2021 [employer]vier Inc.  

## 2020-04-28 LAB — URINE CULTURE

## 2020-04-29 ENCOUNTER — Other Ambulatory Visit: Payer: Self-pay

## 2020-04-29 ENCOUNTER — Ambulatory Visit: Payer: 59 | Admitting: Obstetrics and Gynecology

## 2020-04-29 ENCOUNTER — Encounter: Payer: Self-pay | Admitting: Obstetrics and Gynecology

## 2020-04-29 VITALS — BP 128/80 | HR 79 | Ht 65.5 in | Wt 205.0 lb

## 2020-04-29 DIAGNOSIS — N8110 Cystocele, unspecified: Secondary | ICD-10-CM

## 2020-04-29 DIAGNOSIS — Z4689 Encounter for fitting and adjustment of other specified devices: Secondary | ICD-10-CM

## 2020-04-29 NOTE — Progress Notes (Signed)
GYNECOLOGY  VISIT   HPI: 65 y.o.   Married  Caucasian  female   G2P2 with No LMP recorded. Patient has had a hysterectomy.   here for pessary placement.    Had another UTI last week.  Taking Keflex for 2 more days.  Is getting better.   GYNECOLOGIC HISTORY: No LMP recorded. Patient has had a hysterectomy. Contraception:  Hyst Menopausal hormone therapy: none Last mammogram: 02-21-14 density B/BIRADS 1 negative--appt.06-12-20 Last pap smear: 5-6 years ago at [facility]       OB History    Gravida  2   Para  2   Term      Preterm      AB      Living        SAB      IAB      Ectopic      Multiple      Live Births                 Patient Active Problem List   Diagnosis Date Noted   Gastroesophageal reflux disease without esophagitis 02/06/2020   Vitamin D deficiency 12/01/2016   Hyperlipidemia 11/27/2016   Obesity (BMI 30-39.9) 11/06/2015   Gout 11/17/2013   Essential hypertension, benign 11/17/2013   GAD (generalized anxiety disorder) 11/17/2013    Past Medical History:  Diagnosis Date   Anxiety    Nephrolithiasis     Past Surgical History:  Procedure Laterality Date   ABDOMINAL HYSTERECTOMY     FOOT SURGERY Bilateral    NASAL SEPTUM SURGERY     TONSILLECTOMY      Current Outpatient Medications  Medication Sig Dispense Refill   allopurinol (ZYLOPRIM) 100 MG tablet Take 1 tablet (100 mg total) by mouth daily. 90 tablet 1   aspirin EC 81 MG tablet Take 81 mg by mouth daily.     atorvastatin (LIPITOR) 20 MG tablet Take 1 tablet (20 mg total) by mouth daily. 90 tablet 1   cephALEXin (KEFLEX) 500 MG capsule Take 1 capsule (500 mg total) by mouth 2 (two) times daily for 7 days. 14 capsule 0   clonazePAM (KLONOPIN) 0.5 MG tablet Take 1 tablet (0.5 mg total) by mouth 2 (two) times daily as needed for anxiety. 60 tablet 5   escitalopram (LEXAPRO) 10 MG tablet Take 1 tablet (10 mg total) by mouth daily. 90 tablet 1   fenofibrate  (TRICOR) 145 MG tablet Take 1 tablet (145 mg total) by mouth daily. 90 tablet 1   fluticasone (FLONASE) 50 MCG/ACT nasal spray PLACE 1 SPRAY INTO BOTH NOSTRILS 2 (TWO) TIMES DAILY AS NEEDED FOR ALLERGIES OR RHINITIS. 48 mL 1   furosemide (LASIX) 20 MG tablet Take 1 tablet (20 mg total) by mouth daily. (Patient taking differently: Take 20 mg by mouth as needed.) 90 tablet 1   lisinopril (ZESTRIL) 40 MG tablet Take 1 tablet (40 mg total) by mouth daily. 90 tablet 3   Multiple Vitamin (MULTI-DAY PO) Take 1 tablet by mouth daily. L-Lysine     nystatin (MYCOSTATIN/NYSTOP) powder Apply 1 application topically 3 (three) times daily. Apply to affected area for up to 7 days 15 g 2   Probiotic Product (PROBIOTIC PO)      fluconazole (DIFLUCAN) 150 MG tablet Take 150 mg by mouth once. (Patient not taking: Reported on 04/29/2020)     No current facility-administered medications for this visit.     ALLERGIES: Macrodantin [nitrofurantoin macrocrystal] and Vicodin [hydrocodone-acetaminophen]  Family History  Problem Relation Age of Onset   Heart disease Mother    Kidney disease Mother    Cancer Father        prostate   Heart disease Father    Kidney disease Father     Social History   Socioeconomic History   Marital status: Married    Spouse name: Not on file   Number of children: Not on file   Years of education: Not on file   Highest education level: Not on file  Occupational History   Not on file  Tobacco Use   Smoking status: Never Smoker   Smokeless tobacco: Never Used  Vaping Use   Vaping Use: Never used  Substance and Sexual Activity   Alcohol use: No   Drug use: No   Sexual activity: Yes    Birth control/protection: Surgical    Comment: hysterectomy  Other Topics Concern   Not on file  Social History Narrative   Not on file   Social Determinants of Health   Financial Resource Strain: Not on file  Food Insecurity: Not on file  Transportation  Needs: Not on file  Physical Activity: Not on file  Stress: Not on file  Social Connections: Not on file  Intimate Partner Violence: Not on file    Review of Systems  All other systems reviewed and are negative.   PHYSICAL EXAMINATION:    BP 128/80 (Cuff Size: Large)    Pulse 79    Ht 5' 5.5" (1.664 m)    Wt 205 lb (93 kg)    SpO2 98%    BMI 33.59 kg/m     General appearance: alert, cooperative and appears stated age  Pelvic: External genitalia:  no lesions              Urethra:  normal appearing urethra with no masses, tenderness or lesions        Bimanual Exam:  Uterus:  Absent.              Adnexa: no mass, fullness, tenderness     Gelhorn pessary placed.  Patient able to do maneuvers and maintain the pessary.  She does feel it somewhat.  Not painful.         Chaperone was present for exam.  ASSESSMENT  Female bladder prolapse.  UTI.  Finishing treatment.   PLAN  Patient fitted and received Gelhorn pessary 2 and 1/2 inch with short stem - lot [id number], [employer].  Expiration 01/25/25.  Pessary care reviewed with patient.  Will consider vaginal estrogen after her mammogram is back and normal.  Fu in 7 - 10 days.  She will call for an appointment sooner if she has pain, bleeding, or any other concern.   23 min  total time was spent for this patient encounter, including preparation, face-to-face counseling with the patient, coordination of care, and documentation of the encounter.

## 2020-04-29 NOTE — Patient Instructions (Signed)

## 2020-05-01 DIAGNOSIS — M25552 Pain in left hip: Secondary | ICD-10-CM | POA: Insufficient documentation

## 2020-05-01 DIAGNOSIS — M25551 Pain in right hip: Secondary | ICD-10-CM | POA: Insufficient documentation

## 2020-05-08 ENCOUNTER — Ambulatory Visit: Payer: 59 | Admitting: Obstetrics and Gynecology

## 2020-05-08 ENCOUNTER — Other Ambulatory Visit: Payer: Self-pay

## 2020-05-08 ENCOUNTER — Encounter: Payer: Self-pay | Admitting: Obstetrics and Gynecology

## 2020-05-08 VITALS — BP 138/80 | HR 72 | Ht 65.5 in | Wt 205.0 lb

## 2020-05-08 DIAGNOSIS — Z4689 Encounter for fitting and adjustment of other specified devices: Secondary | ICD-10-CM

## 2020-05-08 DIAGNOSIS — N8110 Cystocele, unspecified: Secondary | ICD-10-CM | POA: Diagnosis not present

## 2020-05-08 NOTE — Progress Notes (Signed)
GYNECOLOGY  VISIT   HPI: 65 y.o.   Married  Caucasian  female   G2P2 with No LMP recorded. Patient has had a hysterectomy.   here for pessary check. Doing well with pessary.  Gelhorn 2 and 1/2 inch with short stem placed on 04/29/20.  Not feeling the pessary.   No bleeding, discharge or vaginal odor.   Pessary staying in well.   Voiding well and having bowel movements ok.   GYNECOLOGIC HISTORY: No LMP recorded. Patient has had a hysterectomy. Contraception:  Hyst Menopausal hormone therapy:  none Last mammogram: 02-21-14 density B/BIRADS 1 negative--appt.06-12-20 Last pap smear:  5-6 years ago at [facility]        OB History    Gravida  2   Para  2   Term      Preterm      AB      Living        SAB      IAB      Ectopic      Multiple      Live Births                 Patient Active Problem List   Diagnosis Date Noted  . Gastroesophageal reflux disease without esophagitis 02/06/2020  . Vitamin D deficiency 12/01/2016  . Hyperlipidemia 11/27/2016  . Obesity (BMI 30-39.9) 11/06/2015  . Gout 11/17/2013  . Essential hypertension, benign 11/17/2013  . GAD (generalized anxiety disorder) 11/17/2013    Past Medical History:  Diagnosis Date  . Anxiety   . Nephrolithiasis     Past Surgical History:  Procedure Laterality Date  . ABDOMINAL HYSTERECTOMY    . FOOT SURGERY Bilateral   . NASAL SEPTUM SURGERY    . TONSILLECTOMY      Current Outpatient Medications  Medication Sig Dispense Refill  . allopurinol (ZYLOPRIM) 100 MG tablet Take 1 tablet (100 mg total) by mouth daily. 90 tablet 1  . aspirin EC 81 MG tablet Take 81 mg by mouth daily.    Marland Kitchen atorvastatin (LIPITOR) 20 MG tablet Take 1 tablet (20 mg total) by mouth daily. 90 tablet 1  . clonazePAM (KLONOPIN) 0.5 MG tablet Take 1 tablet (0.5 mg total) by mouth 2 (two) times daily as needed for anxiety. 60 tablet 5  . escitalopram (LEXAPRO) 10 MG tablet Take 1 tablet (10 mg total) by mouth daily. 90  tablet 1  . fenofibrate (TRICOR) 145 MG tablet Take 1 tablet (145 mg total) by mouth daily. 90 tablet 1  . fluconazole (DIFLUCAN) 150 MG tablet Take 150 mg by mouth once.    . fluticasone (FLONASE) 50 MCG/ACT nasal spray PLACE 1 SPRAY INTO BOTH NOSTRILS 2 (TWO) TIMES DAILY AS NEEDED FOR ALLERGIES OR RHINITIS. 48 mL 1  . furosemide (LASIX) 20 MG tablet Take 1 tablet (20 mg total) by mouth daily. (Patient taking differently: Take 20 mg by mouth as needed.) 90 tablet 1  . lisinopril (ZESTRIL) 40 MG tablet Take 1 tablet (40 mg total) by mouth daily. 90 tablet 3  . Multiple Vitamin (MULTI-DAY PO) Take 1 tablet by mouth daily. L-Lysine    . nystatin (MYCOSTATIN/NYSTOP) powder Apply 1 application topically 3 (three) times daily. Apply to affected area for up to 7 days 15 g 2  . Probiotic Product (PROBIOTIC PO)      No current facility-administered medications for this visit.     ALLERGIES: Macrodantin [nitrofurantoin macrocrystal] and Vicodin [hydrocodone-acetaminophen]  Family History  Problem Relation Age  of Onset  . Heart disease Mother   . Kidney disease Mother   . Cancer Father        prostate  . Heart disease Father   . Kidney disease Father     Social History   Socioeconomic History  . Marital status: Married    Spouse name: Not on file  . Number of children: Not on file  . Years of education: Not on file  . Highest education level: Not on file  Occupational History  . Not on file  Tobacco Use  . Smoking status: Never Smoker  . Smokeless tobacco: Never Used  Vaping Use  . Vaping Use: Never used  Substance and Sexual Activity  . Alcohol use: No  . Drug use: No  . Sexual activity: Yes    Birth control/protection: Surgical    Comment: hysterectomy  Other Topics Concern  . Not on file  Social History Narrative  . Not on file   Social Determinants of Health   Financial Resource Strain: Not on file  Food Insecurity: Not on file  Transportation Needs: Not on file   Physical Activity: Not on file  Stress: Not on file  Social Connections: Not on file  Intimate Partner Violence: Not on file    Review of Systems  All other systems reviewed and are negative.   PHYSICAL EXAMINATION:    BP 138/80 (Cuff Size: Large)   Pulse 72   Ht 5' 5.5" (1.664 m)   Wt 205 lb (93 kg)   SpO2 90%   BMI 33.59 kg/m     General appearance: alert, cooperative and appears stated age   Pelvic: External genitalia:  no lesions              Urethra:  normal appearing urethra with no masses, tenderness or lesions              Bartholins and Skenes: normal                 Vagina: normal appearing vagina with normal color and discharge, no lesions.  Third degree cystocele.              Cervix: absent                Bimanual Exam:  Uterus:  absent              Adnexa: no mass, fullness, tenderness            Pessary removed, cleansed, and replaced.   Chaperone was present for exam:  Claudette Laws  ASSESSMENT  Female bladder prolapse.  Pessary maintenance.   PLAN  Continue Gelhorn pessary care. Call for bleeding, vaginal discharge or odor, or pain. Will consider vaginal estrogen after mammogram is back the beginning of May.  Fu in 6 weeks.

## 2020-06-12 ENCOUNTER — Ambulatory Visit: Payer: 59

## 2020-06-18 NOTE — Progress Notes (Signed)
GYNECOLOGY  VISIT   HPI: 65 y.o.   Married  Caucasian  female   G2P2 with No LMP recorded. Patient has had a hysterectomy.   here for pessary check.  Has Gelhorn pessary 2 and 1/2 inch. Satisfied with pessary. Able to do what she likes to do with reduction of the prolapse.   No discharge or bleeding.   Last month, she had some deep cough due to allergies and had some leakage of her bladder.  No leak with daily activity, lifting or sneezing.   Normal bowel function and control.  GYNECOLOGIC HISTORY: No LMP recorded. Patient has had a hysterectomy. Contraception:  Hyst Menopausal hormone therapy:  none Last mammogram: 02-21-14 density B/BIRADS 1 negative--Pt to scheduled--TBC cx'd last appt. Last pap smear: 5-6 years ago at [facility]        OB History    Gravida  2   Para  2   Term      Preterm      AB      Living        SAB      IAB      Ectopic      Multiple      Live Births                 Patient Active Problem List   Diagnosis Date Noted  . Gastroesophageal reflux disease without esophagitis 02/06/2020  . Vitamin D deficiency 12/01/2016  . Hyperlipidemia 11/27/2016  . Obesity (BMI 30-39.9) 11/06/2015  . Gout 11/17/2013  . Essential hypertension, benign 11/17/2013  . GAD (generalized anxiety disorder) 11/17/2013    Past Medical History:  Diagnosis Date  . Anxiety   . Nephrolithiasis     Past Surgical History:  Procedure Laterality Date  . ABDOMINAL HYSTERECTOMY    . FOOT SURGERY Bilateral   . NASAL SEPTUM SURGERY    . TONSILLECTOMY      Current Outpatient Medications  Medication Sig Dispense Refill  . allopurinol (ZYLOPRIM) 100 MG tablet Take 1 tablet (100 mg total) by mouth daily. 90 tablet 1  . aspirin EC 81 MG tablet Take 81 mg by mouth daily.    Marland Kitchen atorvastatin (LIPITOR) 20 MG tablet Take 1 tablet (20 mg total) by mouth daily. 90 tablet 1  . clonazePAM (KLONOPIN) 0.5 MG tablet Take 1 tablet (0.5 mg total) by mouth 2  (two) times daily as needed for anxiety. 60 tablet 5  . escitalopram (LEXAPRO) 10 MG tablet Take 1 tablet (10 mg total) by mouth daily. 90 tablet 1  . fenofibrate (TRICOR) 145 MG tablet Take 1 tablet (145 mg total) by mouth daily. 90 tablet 1  . fluconazole (DIFLUCAN) 150 MG tablet Take 150 mg by mouth once.    . fluticasone (FLONASE) 50 MCG/ACT nasal spray PLACE 1 SPRAY INTO BOTH NOSTRILS 2 (TWO) TIMES DAILY AS NEEDED FOR ALLERGIES OR RHINITIS. 48 mL 1  . furosemide (LASIX) 20 MG tablet Take 1 tablet (20 mg total) by mouth daily. (Patient taking differently: Take 20 mg by mouth as needed.) 90 tablet 1  . lisinopril (ZESTRIL) 40 MG tablet Take 1 tablet (40 mg total) by mouth daily. 90 tablet 3  . Multiple Vitamin (MULTI-DAY PO) Take 1 tablet by mouth daily. L-Lysine    . nystatin (MYCOSTATIN/NYSTOP) powder Apply 1 application topically 3 (three) times daily. Apply to affected area for up to 7 days 15 g 2  . Probiotic Product (PROBIOTIC PO)  No current facility-administered medications for this visit.     ALLERGIES: Macrodantin [nitrofurantoin macrocrystal] and Vicodin [hydrocodone-acetaminophen]  Family History  Problem Relation Age of Onset  . Heart disease Mother   . Kidney disease Mother   . Cancer Father        prostate  . Heart disease Father   . Kidney disease Father     Social History   Socioeconomic History  . Marital status: Married    Spouse name: Not on file  . Number of children: Not on file  . Years of education: Not on file  . Highest education level: Not on file  Occupational History  . Not on file  Tobacco Use  . Smoking status: Never Smoker  . Smokeless tobacco: Never Used  Vaping Use  . Vaping Use: Never used  Substance and Sexual Activity  . Alcohol use: No  . Drug use: No  . Sexual activity: Yes    Birth control/protection: Surgical    Comment: hysterectomy  Other Topics Concern  . Not on file  Social History Narrative  . Not on file    Social Determinants of Health   Financial Resource Strain: Not on file  Food Insecurity: Not on file  Transportation Needs: Not on file  Physical Activity: Not on file  Stress: Not on file  Social Connections: Not on file  Intimate Partner Violence: Not on file    Review of Systems  All other systems reviewed and are negative.   PHYSICAL EXAMINATION:    BP 112/70 (Cuff Size: Large)   Pulse 81   Ht 5' 5.5" (1.664 m)   Wt 205 lb (93 kg)   SpO2 96%   BMI 33.59 kg/m     General appearance: alert, cooperative and appears stated age   Pelvic: External genitalia:  no lesions              Urethra:  normal appearing urethra with no masses, tenderness or lesions              Bartholins and Skenes: normal                 Vagina: normal appearing vagina with normal color and discharge, no lesions.  Bladder and vault prolapse              Cervix: absent                Bimanual Exam:  Uterus:  absent              Adnexa: no mass, fullness, tenderness     Gelhorn pessary removed, cleansed, and replaced.   Chaperone was present for exam:  Claudette Laws  ASSESSMENT  Bladder and vaginal vault prolapse. Pessary maintenance.  PLAN  Continue pessary use.  We discussed water based lubricants for use with the pessary.  She will update her mammogram.  We reviewed vaginal estrogen cream if desired to be used with the pessary, but I do not think this is necessary at this point.  I did explain potential effect of estrogen cream on breast cancer.  FU in 3 months for pessary check.

## 2020-06-19 ENCOUNTER — Other Ambulatory Visit: Payer: Self-pay

## 2020-06-19 ENCOUNTER — Encounter: Payer: Self-pay | Admitting: Obstetrics and Gynecology

## 2020-06-19 ENCOUNTER — Ambulatory Visit: Payer: 59 | Admitting: Obstetrics and Gynecology

## 2020-06-19 VITALS — BP 112/70 | HR 81 | Ht 65.5 in | Wt 205.0 lb

## 2020-06-19 DIAGNOSIS — Z4689 Encounter for fitting and adjustment of other specified devices: Secondary | ICD-10-CM

## 2020-06-19 DIAGNOSIS — N819 Female genital prolapse, unspecified: Secondary | ICD-10-CM | POA: Diagnosis not present

## 2020-06-19 DIAGNOSIS — N8110 Cystocele, unspecified: Secondary | ICD-10-CM

## 2020-06-29 ENCOUNTER — Ambulatory Visit
Admission: RE | Admit: 2020-06-29 | Discharge: 2020-06-29 | Disposition: A | Discharge status: Home / Self Care | Payer: 59 | Source: Ambulatory Visit | Attending: Nurse Practitioner | Admitting: Nurse Practitioner

## 2020-06-29 ENCOUNTER — Other Ambulatory Visit: Payer: Self-pay

## 2020-06-29 DIAGNOSIS — Z1231 Encounter for screening mammogram for malignant neoplasm of breast: Secondary | ICD-10-CM

## 2020-07-04 ENCOUNTER — Encounter: Payer: Self-pay | Admitting: Family Medicine

## 2020-07-04 ENCOUNTER — Ambulatory Visit: Payer: 59 | Admitting: Family Medicine

## 2020-07-04 VITALS — BP 129/67 | HR 86 | Temp 98.2°F | Ht 65.5 in | Wt 205.0 lb

## 2020-07-04 DIAGNOSIS — R059 Cough, unspecified: Secondary | ICD-10-CM | POA: Diagnosis not present

## 2020-07-04 DIAGNOSIS — J0100 Acute maxillary sinusitis, unspecified: Secondary | ICD-10-CM | POA: Diagnosis not present

## 2020-07-04 MED ORDER — AMOXICILLIN 875 MG PO TABS: 875.0000 mg | ORAL_TABLET | Freq: Two times a day (BID) | ORAL | 0 refills | Status: AC

## 2020-07-04 NOTE — Progress Notes (Signed)
Acute Office Visit  Subjective:    Patient ID: Jane Howard, female    DOB: 1955-08-20, 65 y.o.   MRN: [id number]  Chief Complaint  Patient presents with  . Facial Pain  . Ear Fullness    Cough, congestion, coughing     HPI Patient is in today for cough and congestion x 7 days. She also reports facial pain and ear fullness. Denies fever, sore throat, abdominal, nausea, vomiting, or diarrhea. Denies chest pain or shortness of breath. She has taken allergra-D, mucinex DM, tylenol, dayquil, sudafed and flonase with little relief. She reports her symptoms have not improved. She has been vaccinated against covid and boosted.   Past Medical History:  Diagnosis Date  . Anxiety   . Nephrolithiasis     Past Surgical History:  Procedure Laterality Date  . ABDOMINAL HYSTERECTOMY    . FOOT SURGERY Bilateral   . NASAL SEPTUM SURGERY    . TONSILLECTOMY      Family History  Problem Relation Age of Onset  . Heart disease Mother   . Kidney disease Mother   . Cancer Father        prostate  . Heart disease Father   . Kidney disease Father     Social History   Socioeconomic History  . Marital status: Married    Spouse name: Not on file  . Number of children: Not on file  . Years of education: Not on file  . Highest education level: Not on file  Occupational History  . Not on file  Tobacco Use  . Smoking status: Never Smoker  . Smokeless tobacco: Never Used  Vaping Use  . Vaping Use: Never used  Substance and Sexual Activity  . Alcohol use: No  . Drug use: No  . Sexual activity: Yes    Birth control/protection: Surgical    Comment: hysterectomy  Other Topics Concern  . Not on file  Social History Narrative  . Not on file   Social Determinants of Health   Financial Resource Strain: Not on file  Food Insecurity: Not on file  Transportation Needs: Not on file  Physical Activity: Not on file  Stress: Not on file  Social Connections: Not on file  Intimate  Partner Violence: Not on file    Outpatient Medications Prior to Visit  Medication Sig Dispense Refill  . allopurinol (ZYLOPRIM) 100 MG tablet Take 1 tablet (100 mg total) by mouth daily. 90 tablet 1  . aspirin EC 81 MG tablet Take 81 mg by mouth daily.    Marland Kitchen atorvastatin (LIPITOR) 20 MG tablet Take 1 tablet (20 mg total) by mouth daily. 90 tablet 1  . clonazePAM (KLONOPIN) 0.5 MG tablet Take 1 tablet (0.5 mg total) by mouth 2 (two) times daily as needed for anxiety. 60 tablet 5  . escitalopram (LEXAPRO) 10 MG tablet Take 1 tablet (10 mg total) by mouth daily. 90 tablet 1  . fenofibrate (TRICOR) 145 MG tablet Take 1 tablet (145 mg total) by mouth daily. 90 tablet 1  . fluticasone (FLONASE) 50 MCG/ACT nasal spray PLACE 1 SPRAY INTO BOTH NOSTRILS 2 (TWO) TIMES DAILY AS NEEDED FOR ALLERGIES OR RHINITIS. 48 mL 1  . furosemide (LASIX) 20 MG tablet Take 1 tablet (20 mg total) by mouth daily. (Patient taking differently: Take 20 mg by mouth as needed.) 90 tablet 1  . lisinopril (ZESTRIL) 40 MG tablet Take 1 tablet (40 mg total) by mouth daily. 90 tablet 3  . Multiple Vitamin (  MULTI-DAY PO) Take 1 tablet by mouth daily. L-Lysine    . Probiotic Product (PROBIOTIC PO)     . fluconazole (DIFLUCAN) 150 MG tablet Take 150 mg by mouth once.    . nystatin (MYCOSTATIN/NYSTOP) powder Apply 1 application topically 3 (three) times daily. Apply to affected area for up to 7 days 15 g 2   No facility-administered medications prior to visit.    Allergies  Allergen Reactions  . Macrodantin [Nitrofurantoin Macrocrystal]   . Vicodin [Hydrocodone-Acetaminophen]     Review of Systems As per HPI.     Objective:    Physical Exam Vitals and nursing note reviewed.  Constitutional:      General: She is not in acute distress.    Appearance: Normal appearance. She is not ill-appearing, toxic-appearing or diaphoretic.  HENT:     Head: Normocephalic and atraumatic.     Right Ear: A middle ear effusion is present.  Tympanic membrane is not perforated, erythematous, retracted or bulging.     Left Ear: A middle ear effusion is present. Tympanic membrane is not perforated, erythematous, retracted or bulging.     Nose: Congestion present.     Mouth/Throat:     Mouth: Mucous membranes are moist.     Pharynx: Oropharynx is clear.  Eyes:     Conjunctiva/sclera: Conjunctivae normal.     Pupils: Pupils are equal, round, and reactive to light.  Cardiovascular:     Rate and Rhythm: Normal rate and regular rhythm.     Heart sounds: Normal heart sounds. No murmur heard.   Skin:    General: Skin is warm and dry.  Neurological:     General: No focal deficit present.     Mental Status: She is alert and oriented to person, place, and time.  Psychiatric:        Mood and Affect: Mood normal.        Behavior: Behavior normal.     BP 129/67   Pulse 86   Temp 98.2 F (36.8 C) (Temporal)   Ht 5' 5.5" (1.664 m)   Wt 205 lb (93 kg)   BMI 33.59 kg/m  Wt Readings from Last 3 Encounters:  07/04/20 205 lb (93 kg)  06/19/20 205 lb (93 kg)  05/08/20 205 lb (93 kg)    Health Maintenance Due  Topic Date Due  . PAP SMEAR-Modifier  01/12/2017    There are no preventive care reminders to display for this patient.   Lab Results  Component Value Date   TSH 1.070 11/27/2016   Lab Results  Component Value Date   WBC 8.6 08/04/2019   HGB 13.6 08/04/2019   HCT 40.1 08/04/2019   MCV 88 08/04/2019   PLT 298 08/04/2019   Lab Results  Component Value Date   NA 139 08/04/2019   K 4.7 08/04/2019   CO2 25 08/04/2019   GLUCOSE 105 (H) 08/04/2019   BUN 27 08/04/2019   CREATININE 1.02 (H) 08/04/2019   BILITOT 0.3 08/04/2019   ALKPHOS 63 08/04/2019   AST 22 08/04/2019   ALT 26 08/04/2019   PROT 7.4 08/04/2019   ALBUMIN 4.4 08/04/2019   CALCIUM 10.1 08/04/2019   ANIONGAP 13 11/16/2017   Lab Results  Component Value Date   CHOL 187 08/04/2019   Lab Results  Component Value Date   HDL 52 08/04/2019    Lab Results  Component Value Date   LDLCALC 99 08/04/2019   Lab Results  Component Value Date   TRIG 209 (  H) 08/04/2019   Lab Results  Component Value Date   CHOLHDL 3.6 08/04/2019   Lab Results  Component Value Date   HGBA1C 5.6% 02/01/2013       Assessment & Plan:   Jane Howard was seen today for facial pain and ear fullness.  Diagnoses and all orders for this visit:  Cough Covid test pending, quarantine until results. Continue OTC cough medication.  -     Novel Coronavirus, NAA ([facility])  Acute non-recurrent maxillary sinusitis Amoxicillin as below. Continue decongestants, fluids, rest.  -     amoxicillin (AMOXIL) 875 MG tablet; Take 1 tablet (875 mg total) by mouth 2 (two) times daily for 7 days.  Return to office for new or worsening symptoms, or if symptoms persist.   The patient indicates understanding of these issues and agrees with the plan.   Gabriel Earing, FNP

## 2020-07-05 LAB — SARS-COV-2, NAA 2 DAY TAT

## 2020-07-05 LAB — NOVEL CORONAVIRUS, NAA: SARS-CoV-2, NAA: NOT DETECTED

## 2020-07-15 ENCOUNTER — Telehealth: Payer: Self-pay | Admitting: Nurse Practitioner

## 2020-07-15 NOTE — Telephone Encounter (Signed)
Pt was prescribed amoxicillin (AMOXIL) 875 MG tablet 07/03/2020 and stopped taking it on 07/07/2020 because it gave her severe diarrhea. Can something else be called in. Use [facility]

## 2020-07-16 ENCOUNTER — Encounter: Payer: Self-pay | Admitting: Nurse Practitioner

## 2020-07-16 ENCOUNTER — Ambulatory Visit (INDEPENDENT_AMBULATORY_CARE_PROVIDER_SITE_OTHER): Payer: 59 | Admitting: Nurse Practitioner

## 2020-07-16 DIAGNOSIS — J0110 Acute frontal sinusitis, unspecified: Secondary | ICD-10-CM | POA: Diagnosis not present

## 2020-07-16 MED ORDER — ONDANSETRON HCL 4 MG PO TABS: 4.0000 mg | ORAL_TABLET | Freq: Three times a day (TID) | ORAL | 0 refills | Status: DC | PRN

## 2020-07-16 MED ORDER — BENZONATATE 100 MG PO CAPS: 100.0000 mg | ORAL_CAPSULE | Freq: Three times a day (TID) | ORAL | 0 refills | Status: DC | PRN

## 2020-07-16 MED ORDER — AMOXICILLIN-POT CLAVULANATE 875-125 MG PO TABS: 1.0000 | ORAL_TABLET | Freq: Two times a day (BID) | ORAL | 0 refills | Status: DC

## 2020-07-16 NOTE — Assessment & Plan Note (Signed)
Patient is reporting worsening subacute frontal sinusitis in the last 7 days.  Patient was seen for same symptoms and was prescribed amoxicillin which she did not complete because of nausea.  I provided education to patient that medication should not be discontinued without consulting PCP because nausea medication could have been significant to help alleviate side effect.  Patient is reporting increased hoarseness, ear pain, headache, sinus pressure and cough.  Started patient on amoxicillin 875-125 mg tablet by mouth daily.  Zofran for nausea, increase hydration, take medication as prescribed,  Follow-up with unresolved symptoms.  Patient verbalized understanding  Rx sent to pharmacy.

## 2020-07-16 NOTE — Progress Notes (Signed)
   Virtual Visit  Note Due to COVID-19 pandemic this visit was conducted virtually. This visit type was conducted due to national recommendations for restrictions regarding the COVID-19 Pandemic (e.g. social distancing, sheltering in place) in an effort to limit this patient's exposure and mitigate transmission in our community. All issues noted in this document were discussed and addressed.  A physical exam was not performed with this format.  I connected with Jane Howard on 07/16/20 at  10: 15 a.m. by telephone and verified that I am speaking with the correct person using two identifiers. Jane Howard is currently located at home during visit. The provider, Daryll Drown, NP is located in their office at time of visit.  I discussed the limitations, risks, security and privacy concerns of performing an evaluation and management service by telephone and the availability of in person appointments. I also discussed with the patient that there may be a patient responsible charge related to this service. The patient expressed understanding and agreed to proceed.   History and Present Illness:  Sinusitis This is a recurrent problem. The current episode started in the past 7 days. There has been no fever. The pain is moderate. Associated symptoms include congestion, coughing, ear pain and a hoarse voice. Past treatments include antibiotics. The treatment provided mild relief.      Review of Systems  HENT: Positive for congestion, ear pain and hoarse voice.   Respiratory: Positive for cough.      Observations/Objective:  Televisit patient did not sound to be in distress.  Assessment and Plan: Subacute frontal sinusitis Patient is reporting worsening subacute frontal sinusitis in the last 7 days.  Patient was seen for same symptoms and was prescribed amoxicillin which she did not complete because of nausea.  I provided education to patient that medication should not be discontinued  without consulting PCP because nausea medication could have been significant to help alleviate side effect.  Patient is reporting increased hoarseness, ear pain, headache, sinus pressure and cough.  Started patient on amoxicillin 875-125 mg tablet by mouth daily.  Zofran for nausea, increase hydration, take medication as prescribed,  Follow-up with unresolved symptoms.  Patient verbalized understanding  Rx sent to pharmacy.   Follow Up Instructions: Follow-up with worsening unresolved symptoms.    I discussed the assessment and treatment plan with the patient. The patient was provided an opportunity to ask questions and all were answered. The patient agreed with the plan and demonstrated an understanding of the instructions.   The patient was advised to call back or seek an in-person evaluation if the symptoms worsen or if the condition fails to improve as anticipated.  The above assessment and management plan was discussed with the patient. The patient verbalized understanding of and has agreed to the management plan. Patient is aware to call the clinic if symptoms persist or worsen. Patient is aware when to return to the clinic for a follow-up visit. Patient educated on when it is appropriate to go to the emergency department.   Time call ended: 10:21 AM  I provided 6 minutes of  non face-to-face time during this encounter.    Daryll Drown, NP

## 2020-07-16 NOTE — Telephone Encounter (Signed)
Appointment scheduled.

## 2020-07-16 NOTE — Patient Instructions (Signed)

## 2020-07-16 NOTE — Telephone Encounter (Signed)
It has been over a week since patient was prescribed and stopped antibiotic. She will need a visit if she is still having symptoms.

## 2020-08-08 ENCOUNTER — Other Ambulatory Visit: Payer: Self-pay

## 2020-08-08 ENCOUNTER — Encounter: Payer: Self-pay | Admitting: Nurse Practitioner

## 2020-08-08 ENCOUNTER — Ambulatory Visit: Payer: 59 | Admitting: Nurse Practitioner

## 2020-08-08 VITALS — BP 131/66 | HR 67 | Temp 97.9°F | Resp 20 | Ht 65.0 in | Wt 202.0 lb

## 2020-08-08 DIAGNOSIS — Z23 Encounter for immunization: Secondary | ICD-10-CM

## 2020-08-08 DIAGNOSIS — K219 Gastro-esophageal reflux disease without esophagitis: Secondary | ICD-10-CM

## 2020-08-08 DIAGNOSIS — R609 Edema, unspecified: Secondary | ICD-10-CM

## 2020-08-08 DIAGNOSIS — E669 Obesity, unspecified: Secondary | ICD-10-CM

## 2020-08-08 DIAGNOSIS — I10 Essential (primary) hypertension: Secondary | ICD-10-CM

## 2020-08-08 DIAGNOSIS — E782 Mixed hyperlipidemia: Secondary | ICD-10-CM

## 2020-08-08 DIAGNOSIS — F411 Generalized anxiety disorder: Secondary | ICD-10-CM

## 2020-08-08 DIAGNOSIS — E559 Vitamin D deficiency, unspecified: Secondary | ICD-10-CM

## 2020-08-08 DIAGNOSIS — R600 Localized edema: Secondary | ICD-10-CM

## 2020-08-08 MED ORDER — CLONAZEPAM 0.5 MG PO TABS: 0.5000 mg | ORAL_TABLET | Freq: Two times a day (BID) | ORAL | 5 refills | Status: DC | PRN

## 2020-08-08 MED ORDER — FUROSEMIDE 20 MG PO TABS: 20.0000 mg | ORAL_TABLET | Freq: Every day | ORAL | 1 refills | Status: DC

## 2020-08-08 MED ORDER — FENOFIBRATE 145 MG PO TABS: 145.0000 mg | ORAL_TABLET | Freq: Every day | ORAL | 1 refills | Status: DC

## 2020-08-08 MED ORDER — ESCITALOPRAM OXALATE 10 MG PO TABS
10.0000 mg | ORAL_TABLET | Freq: Every day | ORAL | 1 refills | Status: DC
Start: 2020-08-08 — End: 2021-02-06

## 2020-08-08 MED ORDER — LISINOPRIL 40 MG PO TABS
40.0000 mg | ORAL_TABLET | Freq: Every day | ORAL | 1 refills | Status: DC
Start: 2020-08-08 — End: 2021-02-06

## 2020-08-08 MED ORDER — ATORVASTATIN CALCIUM 20 MG PO TABS: 20.0000 mg | ORAL_TABLET | Freq: Every day | ORAL | 1 refills | Status: DC

## 2020-08-08 NOTE — Addendum Note (Signed)
Addended by: Cleda Daub on: 08/08/2020 05:00 PM   Modules accepted: Orders

## 2020-08-08 NOTE — Progress Notes (Signed)
Subjective:    Patient ID: Jane Howard, female    DOB: May 17, 1955, 65 y.o.   MRN: [id number]   Chief Complaint: Medical Management of Chronic Issues    HPI:  1. Essential hypertension, benign No c/o chest pain, sob or headache. Does not check blood pressure at home. BP Readings from Last 3 Encounters:  08/08/20 131/66  07/04/20 129/67  06/19/20 112/70     2. Mixed hyperlipidemia Tries to watch diet but does not do much exercise. Lab Results  Component Value Date   CHOL 187 08/04/2019   HDL 52 08/04/2019   LDLCALC 99 08/04/2019   TRIG 209 (H) 08/04/2019   CHOLHDL 3.6 08/04/2019     3. Gastroesophageal reflux disease without esophagitis Has not been having any symptoms and is currently on no prescription meds for this  4. GAD (generalized anxiety disorder) Is on klonopin that she takes only at night time.says she is doing well GAD 7 : Generalized Anxiety Score 08/08/2020 02/06/2020 08/04/2019 11/15/2018  Nervous, Anxious, on Edge 0 1 1 0  Control/stop worrying 0 1 1 0  Worry too much - different things 0 0 0 0  Trouble relaxing '1 1 1 ' 0  Restless 0 1 1 0  Easily annoyed or irritable 0 0 1 0  Afraid - awful might happen 0 0 0 0  Total GAD 7 Score '1 4 5 ' 0  Anxiety Difficulty Not difficult at all Not difficult at all Somewhat difficult Not difficult at all      5. Vitamin D deficiency Takes a multi vitamin daily with vitamin d in it.  6. Obesity (BMI 30-39.9) Weight is down 3lbs. Wt Readings from Last 3 Encounters:  08/08/20 202 lb (91.6 kg)  07/04/20 205 lb (93 kg)  06/19/20 205 lb (93 kg)   BMI Readings from Last 3 Encounters:  08/08/20 33.61 kg/m  07/04/20 33.59 kg/m  06/19/20 33.59 kg/m       Outpatient Encounter Medications as of 08/08/2020  Medication Sig   allopurinol (ZYLOPRIM) 100 MG tablet Take 1 tablet (100 mg total) by mouth daily.   aspirin EC 81 MG tablet Take 81 mg by mouth daily.   atorvastatin (LIPITOR) 20 MG tablet Take 1  tablet (20 mg total) by mouth daily.   clonazePAM (KLONOPIN) 0.5 MG tablet Take 1 tablet (0.5 mg total) by mouth 2 (two) times daily as needed for anxiety.   escitalopram (LEXAPRO) 10 MG tablet Take 1 tablet (10 mg total) by mouth daily.   fenofibrate (TRICOR) 145 MG tablet Take 1 tablet (145 mg total) by mouth daily.   fluticasone (FLONASE) 50 MCG/ACT nasal spray PLACE 1 SPRAY INTO BOTH NOSTRILS 2 (TWO) TIMES DAILY AS NEEDED FOR ALLERGIES OR RHINITIS.   furosemide (LASIX) 20 MG tablet Take 1 tablet (20 mg total) by mouth daily. (Patient taking differently: Take 20 mg by mouth as needed.)   lisinopril (ZESTRIL) 40 MG tablet Take 1 tablet (40 mg total) by mouth daily.   Multiple Vitamin (MULTI-DAY PO) Take 1 tablet by mouth daily. L-Lysine   Probiotic Product (PROBIOTIC PO)    [DISCONTINUED] amoxicillin-clavulanate (AUGMENTIN) 875-125 MG tablet Take 1 tablet by mouth 2 (two) times daily.   [DISCONTINUED] benzonatate (TESSALON PERLES) 100 MG capsule Take 1 capsule (100 mg total) by mouth 3 (three) times daily as needed for cough.   [DISCONTINUED] ondansetron (ZOFRAN) 4 MG tablet Take 1 tablet (4 mg total) by mouth every 8 (eight) hours as needed for nausea or vomiting.  No facility-administered encounter medications on file as of 08/08/2020.    Past Surgical History:  Procedure Laterality Date   ABDOMINAL HYSTERECTOMY     FOOT SURGERY Bilateral    NASAL SEPTUM SURGERY     TONSILLECTOMY      Family History  Problem Relation Age of Onset   Heart disease Mother    Kidney disease Mother    Cancer Father        prostate   Heart disease Father    Kidney disease Father     New complaints: None today  Social history: Lives with her husband  Controlled substance contract: 08/08/20     Review of Systems  Constitutional:  Negative for diaphoresis.  Eyes:  Negative for pain.  Respiratory:  Negative for shortness of breath.   Cardiovascular:  Negative for chest pain, palpitations and  leg swelling.  Gastrointestinal:  Negative for abdominal pain.  Endocrine: Negative for polydipsia.  Skin:  Negative for rash.  Neurological:  Negative for dizziness, weakness and headaches.  Hematological:  Does not bruise/bleed easily.  All other systems reviewed and are negative.     Objective:   Physical Exam Vitals and nursing note reviewed.  Constitutional:      General: She is not in acute distress.    Appearance: Normal appearance. She is well-developed.  HENT:     Head: Normocephalic.     Right Ear: Tympanic membrane normal.     Left Ear: Tympanic membrane normal.     Nose: Nose normal.     Mouth/Throat:     Mouth: Mucous membranes are moist.  Eyes:     Pupils: Pupils are equal, round, and reactive to light.  Neck:     Vascular: No carotid bruit or JVD.  Cardiovascular:     Rate and Rhythm: Normal rate and regular rhythm.     Heart sounds: Normal heart sounds.  Pulmonary:     Effort: Pulmonary effort is normal. No respiratory distress.     Breath sounds: Normal breath sounds. No wheezing or rales.  Chest:     Chest wall: No tenderness.  Abdominal:     General: Bowel sounds are normal. There is no distension or abdominal bruit.     Palpations: Abdomen is soft. There is no hepatomegaly, splenomegaly, mass or pulsatile mass.     Tenderness: There is no abdominal tenderness.  Musculoskeletal:        General: Normal range of motion.     Cervical back: Normal range of motion and neck supple.     Right lower leg: Right lower leg edema: 1+.     Left lower leg: Edema (2+ foot) present.  Lymphadenopathy:     Cervical: No cervical adenopathy.  Skin:    General: Skin is warm and dry.  Neurological:     Mental Status: She is alert and oriented to person, place, and time.     Deep Tendon Reflexes: Reflexes are normal and symmetric.  Psychiatric:        Behavior: Behavior normal.        Thought Content: Thought content normal.        Judgment: Judgment normal.    BP  131/66   Pulse 67   Temp 97.9 F (36.6 C) (Temporal)   Resp 20   Ht '5\' 5"'  (1.651 m)   Wt 202 lb (91.6 kg)   SpO2 92%   BMI 33.61 kg/m        Assessment & Plan:  Jane Mcerlean  Howard comes in today with chief complaint of Medical Management of Chronic Issues   Diagnosis and orders addressed:  1. Essential hypertension, benign Low sodium diet - lisinopril (ZESTRIL) 40 MG tablet; Take 1 tablet (40 mg total) by mouth daily.  Dispense: 90 tablet; Refill: 1 - CBC with Differential/Platelet - CMP14+EGFR  2. Mixed hyperlipidemia Low fat diet - atorvastatin (LIPITOR) 20 MG tablet; Take 1 tablet (20 mg total) by mouth daily.  Dispense: 90 tablet; Refill: 1 - fenofibrate (TRICOR) 145 MG tablet; Take 1 tablet (145 mg total) by mouth daily.  Dispense: 90 tablet; Refill: 1 - Lipid panel  3. Gastroesophageal reflux disease without esophagitis Avoid spicy foods Do not eat 2 hours prior to bedtime  4. GAD (generalized anxiety disorder) Stress management - escitalopram (LEXAPRO) 10 MG tablet; Take 1 tablet (10 mg total) by mouth daily.  Dispense: 90 tablet; Refill: 1 - clonazePAM (KLONOPIN) 0.5 MG tablet; Take 1 tablet (0.5 mg total) by mouth 2 (two) times daily as needed for anxiety.  Dispense: 60 tablet; Refill: 5  5. Vitamin D deficiency Continue daily multi vitamin  6. Obesity (BMI 30-39.9) Discussed diet and exercise for person with BMI >25 Will recheck weight in 3-6 months  7. Peripheral edema Elevate legs when sitting Compression socks when up on feet alot - furosemide (LASIX) 20 MG tablet; Take 1 tablet (20 mg total) by mouth daily.  Dispense: 90 tablet; Refill: 1   Labs pending Health Maintenance reviewed Diet and exercise encouraged  Follow up plan: 6 months   Mary-Margaret Hassell Done, FNP

## 2020-08-08 NOTE — Patient Instructions (Signed)
Textbook of family medicine (9th ed., pp. 1062-1073). [place], [place]: [employer].">  Stress, Adult Stress is a normal reaction to life events. Stress is what you feel when life demands more than you are used to, or more than you think you can handle. Some stress can be useful, such as studying for a test or meeting a deadline at work. Stress that occurs too often or for too long can cause problems. It can affect your emotional health and interfere with relationships and normal daily activities. Too much stress can weaken your body's defense system (immune system) and increase your risk for physical illness. If you already have a medicalproblem, stress can make it worse. What are the causes? All sorts of life events can cause stress. An event that causes stress for one person may not be stressful for another person. Major life events, whether positive or negative, commonly cause stress. Examples include: Losing a job or starting a new job. Losing a loved one. Moving to a new town or home. Getting married or divorced. Having a baby. Getting injured or sick. Less obvious life events can also cause stress, especially if they occur day after day or in combination with each other. Examples include: Working long hours. Driving in traffic. Caring for children. Being in debt. Being in a difficult relationship. What are the signs or symptoms? Stress can cause emotional symptoms, including: Anxiety. This is feeling worried, afraid, on edge, overwhelmed, or out of control. Anger, including irritation or impatience. Depression. This is feeling sad, down, helpless, or guilty. Trouble focusing, remembering, or making decisions. Stress can cause physical symptoms, including: Aches and pains. These may affect your head, neck, back, stomach, or other areas of your body. Tight muscles or a clenched jaw. Low energy. Trouble sleeping. Stress can cause unhealthy behaviors, including: Eating to feel better  (overeating) or skipping meals. Working too much or putting off tasks. Smoking, drinking alcohol, or using drugs to feel better. How is this diagnosed? Stress is diagnosed through an assessment by your health care provider. He or she may diagnose this condition based on: Your symptoms and any stressful life events. Your medical history. Tests to rule out other causes of your symptoms. Depending on your condition, your health care provider may refer you to aspecialist for further evaluation. How is this treated?  Stress management techniques are the recommended treatment for stress. Medicineis not typically recommended for the treatment of stress. Techniques to reduce your reaction to stressful life events include: Stress identification. Monitor yourself for symptoms of stress and identify what causes stress for you. These skills may help you to avoid or prepare for stressful events. Time management. Set your priorities, keep a calendar of events, and learn to say no. Taking these actions can help you avoid making too many commitments. Techniques for coping with stress include: Rethinking the problem. Try to think realistically about stressful events rather than ignoring them or overreacting. Try to find the positives in a stressful situation rather than focusing on the negatives. Exercise. Physical exercise can release both physical and emotional tension. The key is to find a form of exercise that you enjoy and do it regularly. Relaxation techniques. These relax the body and mind. The key is to find one or more that you enjoy and use the techniques regularly. Examples include: Meditation, deep breathing, or progressive relaxation techniques. Yoga or tai chi. Biofeedback, mindfulness techniques, or journaling. Listening to music, being out in nature, or participating in other hobbies. Practicing a healthy lifestyle.   Eat a balanced diet, drink plenty of water, limit or avoid caffeine, and get  plenty of sleep. Having a strong support network. Spend time with family, friends, or other people you enjoy being around. Express your feelings and talk things over with someone you trust. Counseling or talk therapy with a mental health professional may be helpful if you are havingtrouble managing stress on your own. Follow these instructions at home: Lifestyle  Avoid drugs. Do not use any products that contain nicotine or tobacco, such as cigarettes, e-cigarettes, and chewing tobacco. If you need help quitting, ask your health care provider. Limit alcohol intake to no more than 1 drink a day for nonpregnant women and 2 drinks a day for men. One drink equals 12 oz of beer, 5 oz of wine, or 1 oz of hard liquor Do not use alcohol or drugs to relax. Eat a balanced diet that includes fresh fruits and vegetables, whole grains, lean meats, fish, eggs, and beans, and low-fat dairy. Avoid processed foods and foods high in added fat, sugar, and salt. Exercise at least 30 minutes on 5 or more days each week. Get 7-8 hours of sleep each night.  General instructions  Practice stress management techniques as discussed with your health care provider. Drink enough fluid to keep your urine clear or pale yellow. Take over-the-counter and prescription medicines only as told by your health care provider. Keep all follow-up visits as told by your health care provider. This is important.  Contact a health care provider if: Your symptoms get worse. You have new symptoms. You feel overwhelmed by your problems and can no longer manage them on your own. Get help right away if: You have thoughts of hurting yourself or others. If you ever feel like you may hurt yourself or others, or have thoughts about taking your own life, get help right away. You can go to your nearest emergency department or call: Your local emergency services (911 in the [place] A suicide crisis helpline, such as the [facility] at 4253100524. This is open 24 hours a day. Summary Stress is a normal reaction to life events. It can cause problems if it happens too often or for too long. Practicing stress management techniques is the best way to treat stress. Counseling or talk therapy with a mental health professional may be helpful if you are having trouble managing stress on your own. This information is not intended to replace advice given to you by your health care provider. Make sure you discuss any questions you have with your healthcare provider. Document Revised: 10/13/2019 Document Reviewed: 10/13/2019 Elsevier Patient Education  2022 [employer].

## 2020-08-09 LAB — CBC WITH DIFFERENTIAL/PLATELET
Basophils Absolute: 0.1 10*3/uL (ref 0.0–0.2)
Basos: 1 %
EOS (ABSOLUTE): 0.1 10*3/uL (ref 0.0–0.4)
Eos: 2 %
Hematocrit: 38.7 % (ref 34.0–46.6)
Hemoglobin: 12.6 g/dL (ref 11.1–15.9)
Immature Grans (Abs): 0 10*3/uL (ref 0.0–0.1)
Immature Granulocytes: 1 %
Lymphocytes Absolute: 3 10*3/uL (ref 0.7–3.1)
Lymphs: 38 %
MCH: 29.6 pg (ref 26.6–33.0)
MCHC: 32.6 g/dL (ref 31.5–35.7)
MCV: 91 fL (ref 79–97)
Monocytes Absolute: 0.4 10*3/uL (ref 0.1–0.9)
Monocytes: 5 %
Neutrophils Absolute: 4.3 10*3/uL (ref 1.4–7.0)
Neutrophils: 53 %
Platelets: 285 10*3/uL (ref 150–450)
RBC: 4.25 x10E6/uL (ref 3.77–5.28)
RDW: 13.3 % (ref 11.7–15.4)
WBC: 8 10*3/uL (ref 3.4–10.8)

## 2020-08-09 LAB — CMP14+EGFR
ALT: 16 IU/L (ref 0–32)
AST: 17 IU/L (ref 0–40)
Albumin/Globulin Ratio: 1.7 (ref 1.2–2.2)
Albumin: 4.3 g/dL (ref 3.8–4.8)
Alkaline Phosphatase: 71 IU/L (ref 44–121)
BUN/Creatinine Ratio: 26 (ref 12–28)
BUN: 21 mg/dL (ref 8–27)
Bilirubin Total: <0.2 mg/dL (ref 0.0–1.2)
CO2: 24 mmol/L (ref 20–29)
Calcium: 9.5 mg/dL (ref 8.7–10.3)
Chloride: 102 mmol/L (ref 96–106)
Creatinine, Ser: 0.82 mg/dL (ref 0.57–1.00)
Globulin, Total: 2.5 g/dL (ref 1.5–4.5)
Glucose: 116 mg/dL — ABNORMAL HIGH (ref 65–99)
Potassium: 4.4 mmol/L (ref 3.5–5.2)
Sodium: 141 mmol/L (ref 134–144)
Total Protein: 6.8 g/dL (ref 6.0–8.5)
eGFR: 80 mL/min/{1.73_m2} (ref >59)

## 2020-08-09 LAB — LIPID PANEL
Chol/HDL Ratio: 3.6 ratio (ref 0.0–4.4)
Cholesterol, Total: 178 mg/dL (ref 100–199)
HDL: 50 mg/dL (ref >39)
LDL Chol Calc (NIH): 86 mg/dL (ref 0–99)
Triglycerides: 253 mg/dL — ABNORMAL HIGH (ref 0–149)
VLDL Cholesterol Cal: 42 mg/dL — ABNORMAL HIGH (ref 5–40)

## 2020-08-15 ENCOUNTER — Other Ambulatory Visit: Payer: Self-pay | Admitting: Nurse Practitioner

## 2020-08-15 DIAGNOSIS — M1A9XX0 Chronic gout, unspecified, without tophus (tophi): Secondary | ICD-10-CM

## 2020-09-16 NOTE — Progress Notes (Signed)
GYNECOLOGY  VISIT   HPI: 65 y.o.   Married  Caucasian  female   G2P2 with No LMP recorded. Patient has had a hysterectomy.   here for pessary check.   Likes her pessary, Gelhorn 2 and 1/2 inch.   No pain, bleeding, discharge, or discomfort.   Voiding well.  No urinary incontinence.  Have normal BMs.   GYNECOLOGIC HISTORY: No LMP recorded. Patient has had a hysterectomy. Contraception:  Hyst Menopausal hormone therapy:  none Last mammogram: 06-29-20 3D/Neg/Birads1 Last pap smear:  5-6 years ago at [facility]        OB History     Gravida  2   Para  2   Term      Preterm      AB      Living         SAB      IAB      Ectopic      Multiple      Live Births                 Patient Active Problem List   Diagnosis Date Noted   Subacute frontal sinusitis 07/16/2020   Gastroesophageal reflux disease without esophagitis 02/06/2020   Vitamin D deficiency 12/01/2016   Hyperlipidemia 11/27/2016   Obesity (BMI 30-39.9) 11/06/2015   Gout 11/17/2013   Essential hypertension, benign 11/17/2013   GAD (generalized anxiety disorder) 11/17/2013    Past Medical History:  Diagnosis Date   Anxiety    Nephrolithiasis     Past Surgical History:  Procedure Laterality Date   ABDOMINAL HYSTERECTOMY     FOOT SURGERY Bilateral    NASAL SEPTUM SURGERY     TONSILLECTOMY      Current Outpatient Medications  Medication Sig Dispense Refill   allopurinol (ZYLOPRIM) 100 MG tablet TAKE 1 TABLET BY MOUTH EVERY DAY 90 tablet 0   aspirin EC 81 MG tablet Take 81 mg by mouth daily.     atorvastatin (LIPITOR) 20 MG tablet Take 1 tablet (20 mg total) by mouth daily. 90 tablet 1   clonazePAM (KLONOPIN) 0.5 MG tablet Take 1 tablet (0.5 mg total) by mouth 2 (two) times daily as needed for anxiety. 60 tablet 5   escitalopram (LEXAPRO) 10 MG tablet Take 1 tablet (10 mg total) by mouth daily. 90 tablet 1   fenofibrate (TRICOR) 145 MG tablet Take 1 tablet (145 mg total) by  mouth daily. 90 tablet 1   fluticasone (FLONASE) 50 MCG/ACT nasal spray PLACE 1 SPRAY INTO BOTH NOSTRILS 2 (TWO) TIMES DAILY AS NEEDED FOR ALLERGIES OR RHINITIS. 48 mL 1   furosemide (LASIX) 20 MG tablet Take 1 tablet (20 mg total) by mouth daily. 90 tablet 1   lisinopril (ZESTRIL) 40 MG tablet Take 1 tablet (40 mg total) by mouth daily. 90 tablet 1   Multiple Vitamin (MULTI-DAY PO) Take 1 tablet by mouth daily. L-Lysine     Probiotic Product (PROBIOTIC PO)      No current facility-administered medications for this visit.     ALLERGIES: Macrodantin [nitrofurantoin macrocrystal] and Vicodin [hydrocodone-acetaminophen]  Family History  Problem Relation Age of Onset   Heart disease Mother    Kidney disease Mother    Cancer Father        prostate   Heart disease Father    Kidney disease Father     Social History   Socioeconomic History   Marital status: Married    Spouse name: Not on file  Number of children: Not on file   Years of education: Not on file   Highest education level: Not on file  Occupational History   Not on file  Tobacco Use   Smoking status: Never   Smokeless tobacco: Never  Vaping Use   Vaping Use: Never used  Substance and Sexual Activity   Alcohol use: No   Drug use: No   Sexual activity: Yes    Birth control/protection: Surgical    Comment: hysterectomy  Other Topics Concern   Not on file  Social History Narrative   Not on file   Social Determinants of Health   Financial Resource Strain: Not on file  Food Insecurity: Not on file  Transportation Needs: Not on file  Physical Activity: Not on file  Stress: Not on file  Social Connections: Not on file  Intimate Partner Violence: Not on file    Review of Systems  All other systems reviewed and are negative.  PHYSICAL EXAMINATION:    BP 130/78   Pulse 79   Ht 5' 5.5" (1.664 m)   Wt 205 lb (93 kg)   SpO2 98%   BMI 33.59 kg/m     General appearance: alert, cooperative and appears stated  age  Pelvic: External genitalia:  no lesions              Urethra:  normal appearing urethra with no masses, tenderness or lesions              Bartholins and Skenes: normal                 Vagina: normal appearing vagina with normal color and discharge, no lesions              Cervix: no lesions                Bimanual Exam:  Uterus:  normal size, contour, position, consistency, mobility, non-tender              Adnexa: no mass, fullness, tenderness          Chaperone was present for exam:  Marchelle Folks, CMA  ASSESSMENT  Bladder and vaginal vault prolapse. Pessary maintenance.  PLAN  Continue pessary use.  Estrace cream Rx.  I discussed potential effect on breast cancer.  Instructed in use.  Fu in 3 months.  An After Visit Summary was printed and given to the patient.

## 2020-09-18 ENCOUNTER — Encounter: Payer: Self-pay | Admitting: Obstetrics and Gynecology

## 2020-09-18 ENCOUNTER — Ambulatory Visit (INDEPENDENT_AMBULATORY_CARE_PROVIDER_SITE_OTHER): Payer: 59 | Admitting: Obstetrics and Gynecology

## 2020-09-18 ENCOUNTER — Other Ambulatory Visit: Payer: Self-pay

## 2020-09-18 VITALS — BP 130/78 | HR 79 | Ht 65.5 in | Wt 205.0 lb

## 2020-09-18 DIAGNOSIS — N8110 Cystocele, unspecified: Secondary | ICD-10-CM | POA: Diagnosis not present

## 2020-09-18 DIAGNOSIS — N819 Female genital prolapse, unspecified: Secondary | ICD-10-CM

## 2020-09-18 DIAGNOSIS — Z4689 Encounter for fitting and adjustment of other specified devices: Secondary | ICD-10-CM

## 2020-09-18 MED ORDER — ESTRADIOL 0.1 MG/GM VA CREA: TOPICAL_CREAM | VAGINAL | 1 refills | Status: DC

## 2020-11-07 ENCOUNTER — Other Ambulatory Visit: Payer: Self-pay | Admitting: Nurse Practitioner

## 2020-11-07 DIAGNOSIS — M1A9XX0 Chronic gout, unspecified, without tophus (tophi): Secondary | ICD-10-CM

## 2020-11-11 ENCOUNTER — Ambulatory Visit (INDEPENDENT_AMBULATORY_CARE_PROVIDER_SITE_OTHER): Payer: Medicare Other

## 2020-11-11 ENCOUNTER — Other Ambulatory Visit: Payer: Self-pay

## 2020-11-11 DIAGNOSIS — Z23 Encounter for immunization: Secondary | ICD-10-CM

## 2020-12-18 ENCOUNTER — Encounter: Payer: Self-pay | Admitting: Obstetrics and Gynecology

## 2020-12-18 ENCOUNTER — Other Ambulatory Visit: Payer: Self-pay

## 2020-12-18 ENCOUNTER — Ambulatory Visit (INDEPENDENT_AMBULATORY_CARE_PROVIDER_SITE_OTHER): Payer: Medicare Other | Admitting: Obstetrics and Gynecology

## 2020-12-18 VITALS — BP 130/64 | Ht 65.5 in | Wt 205.0 lb

## 2020-12-18 DIAGNOSIS — Z4689 Encounter for fitting and adjustment of other specified devices: Secondary | ICD-10-CM

## 2020-12-18 DIAGNOSIS — N819 Female genital prolapse, unspecified: Secondary | ICD-10-CM

## 2020-12-18 NOTE — Progress Notes (Signed)
GYNECOLOGY  VISIT   HPI: 65 y.o.   Married  Caucasian  female   G2P2 with No LMP recorded. Patient has had a hysterectomy.   here for pessary check  Has Gelhorn 2 1/2 inch.   Has Rx for Estrace vaginal cream once a week.   No bleeding orr spotting.  No pain.  No vaginal discharge.   Can leak with a sneeze.  This is manageable.   She is satisfied with the pessary.   No more UTIs since starting use of the pessary.   GYNECOLOGIC HISTORY: No LMP recorded. Patient has had a hysterectomy. Contraception:  Hyst Menopausal hormone therapy:  none Last mammogram:  06-29-20 3D/Neg/Birads1 Last pap smear:  NA - hysterectomy.         OB History     Gravida  2   Para  2   Term      Preterm      AB      Living         SAB      IAB      Ectopic      Multiple      Live Births                 Patient Active Problem List   Diagnosis Date Noted   Subacute frontal sinusitis 07/16/2020   Gastroesophageal reflux disease without esophagitis 02/06/2020   Vitamin D deficiency 12/01/2016   Hyperlipidemia 11/27/2016   Obesity (BMI 30-39.9) 11/06/2015   Gout 11/17/2013   Essential hypertension, benign 11/17/2013   GAD (generalized anxiety disorder) 11/17/2013    Past Medical History:  Diagnosis Date   Anxiety    Nephrolithiasis     Past Surgical History:  Procedure Laterality Date   ABDOMINAL HYSTERECTOMY     FOOT SURGERY Bilateral    NASAL SEPTUM SURGERY     TONSILLECTOMY      Current Outpatient Medications  Medication Sig Dispense Refill   allopurinol (ZYLOPRIM) 100 MG tablet TAKE 1 TABLET BY MOUTH EVERY DAY 90 tablet 0   aspirin EC 81 MG tablet Take 81 mg by mouth daily.     atorvastatin (LIPITOR) 20 MG tablet Take 1 tablet (20 mg total) by mouth daily. 90 tablet 1   clonazePAM (KLONOPIN) 0.5 MG tablet Take 1 tablet (0.5 mg total) by mouth 2 (two) times daily as needed for anxiety. 60 tablet 5   escitalopram (LEXAPRO) 10 MG tablet Take 1 tablet (10 mg  total) by mouth daily. 90 tablet 1   estradiol (ESTRACE) 0.1 MG/GM vaginal cream Use 1/2 g vaginally two or three times per week as needed to maintain symptom relief. 42.5 g 1   fenofibrate (TRICOR) 145 MG tablet Take 1 tablet (145 mg total) by mouth daily. 90 tablet 1   fluticasone (FLONASE) 50 MCG/ACT nasal spray PLACE 1 SPRAY INTO BOTH NOSTRILS 2 (TWO) TIMES DAILY AS NEEDED FOR ALLERGIES OR RHINITIS. 48 mL 1   furosemide (LASIX) 20 MG tablet Take 1 tablet (20 mg total) by mouth daily. 90 tablet 1   lisinopril (ZESTRIL) 40 MG tablet Take 1 tablet (40 mg total) by mouth daily. 90 tablet 1   Multiple Vitamin (MULTI-DAY PO) Take 1 tablet by mouth daily. L-Lysine     Probiotic Product (PROBIOTIC PO)      No current facility-administered medications for this visit.     ALLERGIES: Macrodantin [nitrofurantoin macrocrystal] and Vicodin [hydrocodone-acetaminophen]  Family History  Problem Relation Age of Onset   Heart  disease Mother    Kidney disease Mother    Cancer Father        prostate   Heart disease Father    Kidney disease Father     Social History   Socioeconomic History   Marital status: Married    Spouse name: Not on file   Number of children: Not on file   Years of education: Not on file   Highest education level: Not on file  Occupational History   Not on file  Tobacco Use   Smoking status: Never   Smokeless tobacco: Never  Vaping Use   Vaping Use: Never used  Substance and Sexual Activity   Alcohol use: No   Drug use: No   Sexual activity: Yes    Birth control/protection: Surgical    Comment: hysterectomy  Other Topics Concern   Not on file  Social History Narrative   Not on file   Social Determinants of Health   Financial Resource Strain: Not on file  Food Insecurity: Not on file  Transportation Needs: Not on file  Physical Activity: Not on file  Stress: Not on file  Social Connections: Not on file  Intimate Partner Violence: Not on file    Review  of Systems  All other systems reviewed and are negative.  PHYSICAL EXAMINATION:    BP 130/64   Ht 5' 5.5" (1.664 m)   Wt 205 lb (93 kg)   BMI 33.59 kg/m     General appearance: alert, cooperative and appears stated age   Pelvic: External genitalia:  no lesions              Urethra:  normal appearing urethra with no masses, tenderness or lesions              Bartholins and Skenes: normal                 Vagina: normal appearing vagina with normal color and discharge, no lesions.  Second degree cystocele and rectocele.                Cervix:                 Bimanual Exam:  Uterus:  absent              Adnexa: no mass, fullness, tenderness      Gelhorn pessary removed, cleansed, and replaced with Estrace cream 1 gram.        Chaperone was present for exam:  Marchelle Folks, CMA  ASSESSMENT  Pelvic organ prolapse. Pessary maintenance.  Stress incontinence.  Manageable.   PLAN  Continue pessary care.  Use vaginal Estrace cream 1/2 gram one to two times weekly.  Ok for [employer] use also.  Fu in 3 months for breast and pelvic exam and pessary maintenance.   An After Visit Summary was printed and given to the patient.

## 2021-01-22 ENCOUNTER — Other Ambulatory Visit: Payer: Self-pay | Admitting: Nurse Practitioner

## 2021-01-22 DIAGNOSIS — M1A9XX0 Chronic gout, unspecified, without tophus (tophi): Secondary | ICD-10-CM

## 2021-01-22 DIAGNOSIS — E782 Mixed hyperlipidemia: Secondary | ICD-10-CM

## 2021-02-06 ENCOUNTER — Ambulatory Visit (INDEPENDENT_AMBULATORY_CARE_PROVIDER_SITE_OTHER): Payer: Medicare Other | Admitting: Nurse Practitioner

## 2021-02-06 ENCOUNTER — Encounter: Payer: Self-pay | Admitting: Nurse Practitioner

## 2021-02-06 VITALS — BP 137/75 | HR 76 | Temp 97.9°F | Ht 65.5 in | Wt 200.6 lb

## 2021-02-06 DIAGNOSIS — F411 Generalized anxiety disorder: Secondary | ICD-10-CM

## 2021-02-06 DIAGNOSIS — E669 Obesity, unspecified: Secondary | ICD-10-CM

## 2021-02-06 DIAGNOSIS — E782 Mixed hyperlipidemia: Secondary | ICD-10-CM | POA: Diagnosis not present

## 2021-02-06 DIAGNOSIS — F5101 Primary insomnia: Secondary | ICD-10-CM | POA: Insufficient documentation

## 2021-02-06 DIAGNOSIS — M1A9XX0 Chronic gout, unspecified, without tophus (tophi): Secondary | ICD-10-CM | POA: Diagnosis not present

## 2021-02-06 DIAGNOSIS — K219 Gastro-esophageal reflux disease without esophagitis: Secondary | ICD-10-CM

## 2021-02-06 DIAGNOSIS — Z23 Encounter for immunization: Secondary | ICD-10-CM | POA: Diagnosis not present

## 2021-02-06 DIAGNOSIS — R609 Edema, unspecified: Secondary | ICD-10-CM

## 2021-02-06 DIAGNOSIS — I10 Essential (primary) hypertension: Secondary | ICD-10-CM

## 2021-02-06 DIAGNOSIS — E559 Vitamin D deficiency, unspecified: Secondary | ICD-10-CM

## 2021-02-06 MED ORDER — FUROSEMIDE 20 MG PO TABS: 20.0000 mg | ORAL_TABLET | Freq: Every day | ORAL | 1 refills | Status: DC

## 2021-02-06 MED ORDER — DOXEPIN HCL 10 MG PO CAPS: 10.0000 mg | ORAL_CAPSULE | Freq: Every evening | ORAL | 5 refills | Status: DC | PRN

## 2021-02-06 MED ORDER — ALLOPURINOL 100 MG PO TABS
100.0000 mg | ORAL_TABLET | Freq: Every day | ORAL | 1 refills | Status: DC
Start: 2021-02-06 — End: 2021-08-08

## 2021-02-06 MED ORDER — LISINOPRIL 40 MG PO TABS: 40.0000 mg | ORAL_TABLET | Freq: Every day | ORAL | 1 refills | Status: DC

## 2021-02-06 MED ORDER — CLONAZEPAM 0.5 MG PO TABS: 0.5000 mg | ORAL_TABLET | Freq: Two times a day (BID) | ORAL | 5 refills | Status: DC | PRN

## 2021-02-06 MED ORDER — FENOFIBRATE 145 MG PO TABS: 145.0000 mg | ORAL_TABLET | Freq: Every day | ORAL | 1 refills | Status: DC

## 2021-02-06 MED ORDER — ATORVASTATIN CALCIUM 20 MG PO TABS: 20.0000 mg | ORAL_TABLET | Freq: Every day | ORAL | 1 refills | Status: DC

## 2021-02-06 MED ORDER — ESCITALOPRAM OXALATE 10 MG PO TABS: 10.0000 mg | ORAL_TABLET | Freq: Every day | ORAL | 1 refills | Status: DC

## 2021-02-06 NOTE — Patient Instructions (Signed)

## 2021-02-06 NOTE — Addendum Note (Signed)
Addended by: Lorelee Cover C on: 02/06/2021 12:30 PM   Modules accepted: Orders

## 2021-02-06 NOTE — Progress Notes (Signed)
Subjective:    Patient ID: Jane Howard, female    DOB: 25-Jul-1955, 65 y.o.   MRN: [id number]   Chief Complaint: medical management of chronic issues     HPI:  Jane Howard is a 65 y.o. who identifies as a female who was assigned female at birth.   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 Essential hypertension, benign No c/o chest pain, sob or headache. Does not check blood pressure at home BP Readings from Last 3 Encounters:  12/18/20 130/64  09/18/20 130/78  08/08/20 131/66     2. Mixed hyperlipidemia Does not really watch diet very closely. Does no dedicated exercise. Lab Results  Component Value Date   CHOL 178 08/08/2020   HDL 50 08/08/2020   LDLCALC 86 08/08/2020   TRIG 253 (H) 08/08/2020   CHOLHDL 3.6 08/08/2020  The 10-year ASCVD risk score (Arnett DK, et al., 2019) is: 15%    3. Gastroesophageal reflux disease without esophagitis Is on no prescription meds for this. Uses otc meds as neede. Only has symptoms once a month.  4. Chronic gout without tophus, unspecified cause, unspecified site Denies any recent flare ups  5. GAD (generalized anxiety disorder) Is on klonopin bid GAD 7 : Generalized Anxiety Score 02/06/2021 08/08/2020 02/06/2020 08/04/2019  Nervous, Anxious, on Edge 1 0 1 1  Control/stop worrying 0 0 1 1  Worry too much - different things 0 0 0 0  Trouble relaxing 1 1 1 1   Restless 0 0 1 1  Easily annoyed or irritable 0 0 0 1  Afraid - awful might happen 0 0 0 0  Total GAD 7 Score 2 1 4 5   Anxiety Difficulty Not difficult at all Not difficult at all Not difficult at all Somewhat difficult      6. Vitamin D deficiency Is on daily vitamin d supplement  7. Obesity (BMI 30-39.9) Weight is down 5lbs. Wt Readings from Last 3 Encounters:  02/06/21 200 lb 9.6 oz (91 kg)  12/18/20 205 lb (93 kg)  09/18/20  205 lb (93 kg)   BMI Readings from Last 3 Encounters:  02/06/21 32.87 kg/m  12/18/20 33.59 kg/m  09/18/20 33.59 kg/m     New complaints: Has trouble.only sleeps about 4 hours. Has tried melatonin OTC and that makes her have crazy dreams.  Allergies  Allergen Reactions   Macrodantin [Nitrofurantoin Macrocrystal]    Vicodin [Hydrocodone-Acetaminophen]    Outpatient Encounter Medications as of 02/06/2021  Medication Sig   allopurinol (ZYLOPRIM) 100 MG tablet TAKE 1 TABLET BY MOUTH EVERY DAY   aspirin EC 81 MG tablet Take 81 mg by mouth daily.   atorvastatin (LIPITOR) 20 MG tablet Take 1 tablet (20 mg total) by mouth daily.   clonazePAM (KLONOPIN) 0.5 MG tablet Take 1 tablet (0.5 mg total) by mouth 2 (two) times daily as needed for anxiety.   escitalopram (LEXAPRO) 10 MG tablet Take 1 tablet (10 mg total) by mouth daily.   estradiol (ESTRACE) 0.1 MG/GM vaginal cream Use 1/2 g vaginally two or three times per week as needed to maintain symptom relief.   fenofibrate (TRICOR) 145 MG tablet TAKE 1 TABLET BY MOUTH EVERY DAY   fluticasone (FLONASE) 50 MCG/ACT nasal spray PLACE 1 SPRAY INTO BOTH NOSTRILS 2 (TWO) TIMES DAILY AS NEEDED FOR ALLERGIES OR RHINITIS.   furosemide (LASIX) 20 MG tablet  Take 1 tablet (20 mg total) by mouth daily.   lisinopril (ZESTRIL) 40 MG tablet Take 1 tablet (40 mg total) by mouth daily.   Multiple Vitamin (MULTI-DAY PO) Take 1 tablet by mouth daily. L-Lysine   Probiotic Product (PROBIOTIC PO)    No facility-administered encounter medications on file as of 02/06/2021.    Past Surgical History:  Procedure Laterality Date   ABDOMINAL HYSTERECTOMY     FOOT SURGERY Bilateral    NASAL SEPTUM SURGERY     TONSILLECTOMY      Family History  Problem Relation Age of Onset   Heart disease Mother    Kidney disease Mother    Cancer Father        prostate   Heart disease Father    Kidney disease Father       Controlled substance contract:  08/13/20     Review of Systems  Constitutional:  Negative for diaphoresis.  Eyes:  Negative for pain.  Respiratory:  Negative for shortness of breath.   Cardiovascular:  Negative for chest pain, palpitations and leg swelling.  Gastrointestinal:  Negative for abdominal pain.  Endocrine: Negative for polydipsia.  Skin:  Negative for rash.  Neurological:  Negative for dizziness, weakness and headaches.  Hematological:  Does not bruise/bleed easily.  All other systems reviewed and are negative.     Objective:   Physical Exam Vitals and nursing note reviewed.  Constitutional:      General: She is not in acute distress.    Appearance: Normal appearance. She is well-developed.  HENT:     Head: Normocephalic.     Right Ear: Tympanic membrane normal.     Left Ear: Tympanic membrane normal.     Nose: Nose normal.     Mouth/Throat:     Mouth: Mucous membranes are moist.  Eyes:     Pupils: Pupils are equal, round, and reactive to light.  Neck:     Vascular: No carotid bruit or JVD.  Cardiovascular:     Rate and Rhythm: Normal rate and regular rhythm.     Heart sounds: Normal heart sounds.  Pulmonary:     Effort: Pulmonary effort is normal. No respiratory distress.     Breath sounds: Normal breath sounds. No wheezing or rales.  Chest:     Chest wall: No tenderness.  Abdominal:     General: Bowel sounds are normal. There is no distension or abdominal bruit.     Palpations: Abdomen is soft. There is no hepatomegaly, splenomegaly, mass or pulsatile mass.     Tenderness: There is no abdominal tenderness.  Musculoskeletal:        General: Normal range of motion.     Cervical back: Normal range of motion and neck supple.  Lymphadenopathy:     Cervical: No cervical adenopathy.  Skin:    General: Skin is warm and dry.  Neurological:     Mental Status: She is alert and oriented to person, place, and time.     Deep Tendon Reflexes: Reflexes are normal and symmetric.  Psychiatric:         Behavior: Behavior normal.        Thought Content: Thought content normal.        Judgment: Judgment normal.   BP 137/75    Pulse 76    Temp 97.9 F (36.6 C) (Temporal)    Ht 5' 5.5" (1.664 m)    Wt 200 lb 9.6 oz (91 kg)    SpO2 93%  BMI 32.87 kg/m         Assessment & Plan:  Jane Howard comes in today with chief complaint of Medical Management of Chronic Issues   Diagnosis and orders addressed:  1. Essential hypertension, benign Low sodium diet - lisinopril (ZESTRIL) 40 MG tablet; Take 1 tablet (40 mg total) by mouth daily.  Dispense: 90 tablet; Refill: 1 - CBC with Differential/Platelet - CMP14+EGFR  2. Mixed hyperlipidemia Low fat diet - fenofibrate (TRICOR) 145 MG tablet; Take 1 tablet (145 mg total) by mouth daily.  Dispense: 90 tablet; Refill: 1 - atorvastatin (LIPITOR) 20 MG tablet; Take 1 tablet (20 mg total) by mouth daily.  Dispense: 90 tablet; Refill: 1 - Lipid panel  3. Gastroesophageal reflux disease without esophagitis Avoid spicy foods Do not eat 2 hours prior to bedtime   4. Chronic gout without tophus, unspecified cause, unspecified site - allopurinol (ZYLOPRIM) 100 MG tablet; Take 1 tablet (100 mg total) by mouth daily.  Dispense: 90 tablet; Refill: 1  5. GAD (generalized anxiety disorder) Stress maanegement - escitalopram (LEXAPRO) 10 MG tablet; Take 1 tablet (10 mg total) by mouth daily.  Dispense: 90 tablet; Refill: 1 - clonazePAM (KLONOPIN) 0.5 MG tablet; Take 1 tablet (0.5 mg total) by mouth 2 (two) times daily as needed for anxiety.  Dispense: 60 tablet; Refill: 5  6. Vitamin D deficiency Continue daily vitamin d supplement  7. Obesity (BMI 30-39.9) Discussed diet and exercise for person with BMI >25 Will recheck weight in 3-6 months   8. Primary insomnia Bedtime routine Added doxepin - let me know if does not help - doxepin (SINEQUAN) 10 MG capsule; Take 1 capsule (10 mg total) by mouth at bedtime as needed.  Dispense: 30  capsule; Refill: 5  9. Peripheral edema Elevate legs when sitting - furosemide (LASIX) 20 MG tablet; Take 1 tablet (20 mg total) by mouth daily.  Dispense: 90 tablet; Refill: 1   Labs pending Health Maintenance reviewed Diet and exercise encouraged  Follow up plan: 6 months   Mary-Margaret Hassell Done, FNP

## 2021-02-07 LAB — CMP14+EGFR
ALT: 19 IU/L (ref 0–32)
AST: 20 IU/L (ref 0–40)
Albumin/Globulin Ratio: 2 (ref 1.2–2.2)
Albumin: 4.7 g/dL (ref 3.8–4.8)
Alkaline Phosphatase: 101 IU/L (ref 44–121)
BUN/Creatinine Ratio: 19 (ref 12–28)
BUN: 16 mg/dL (ref 8–27)
Bilirubin Total: 0.4 mg/dL (ref 0.0–1.2)
CO2: 23 mmol/L (ref 20–29)
Calcium: 9.9 mg/dL (ref 8.7–10.3)
Chloride: 105 mmol/L (ref 96–106)
Creatinine, Ser: 0.84 mg/dL (ref 0.57–1.00)
Globulin, Total: 2.4 g/dL (ref 1.5–4.5)
Glucose: 104 mg/dL — ABNORMAL HIGH (ref 70–99)
Potassium: 5 mmol/L (ref 3.5–5.2)
Sodium: 138 mmol/L (ref 134–144)
Total Protein: 7.1 g/dL (ref 6.0–8.5)
eGFR: 77 mL/min/{1.73_m2} (ref >59)

## 2021-02-07 LAB — CBC WITH DIFFERENTIAL/PLATELET
Basophils Absolute: 0.1 10*3/uL (ref 0.0–0.2)
Basos: 1 %
EOS (ABSOLUTE): 0.1 10*3/uL (ref 0.0–0.4)
Eos: 2 %
Hematocrit: 43.9 % (ref 34.0–46.6)
Hemoglobin: 14.5 g/dL (ref 11.1–15.9)
Immature Grans (Abs): 0.1 10*3/uL (ref 0.0–0.1)
Immature Granulocytes: 1 %
Lymphocytes Absolute: 2.4 10*3/uL (ref 0.7–3.1)
Lymphs: 40 %
MCH: 29.5 pg (ref 26.6–33.0)
MCHC: 33 g/dL (ref 31.5–35.7)
MCV: 89 fL (ref 79–97)
Monocytes Absolute: 0.4 10*3/uL (ref 0.1–0.9)
Monocytes: 6 %
Neutrophils Absolute: 3 10*3/uL (ref 1.4–7.0)
Neutrophils: 50 %
Platelets: 293 10*3/uL (ref 150–450)
RBC: 4.92 x10E6/uL (ref 3.77–5.28)
RDW: 12.2 % (ref 11.7–15.4)
WBC: 6 10*3/uL (ref 3.4–10.8)

## 2021-02-07 LAB — LIPID PANEL
Chol/HDL Ratio: 3.3 ratio (ref 0.0–4.4)
Cholesterol, Total: 168 mg/dL (ref 100–199)
HDL: 51 mg/dL (ref >39)
LDL Chol Calc (NIH): 96 mg/dL (ref 0–99)
Triglycerides: 118 mg/dL (ref 0–149)
VLDL Cholesterol Cal: 21 mg/dL (ref 5–40)

## 2021-03-02 ENCOUNTER — Other Ambulatory Visit: Payer: Self-pay | Admitting: Nurse Practitioner

## 2021-03-02 DIAGNOSIS — F5101 Primary insomnia: Secondary | ICD-10-CM

## 2021-03-03 ENCOUNTER — Telehealth: Payer: Self-pay | Admitting: *Deleted

## 2021-03-03 NOTE — Telephone Encounter (Signed)
Patient called c/o UTI symptoms pressure, frequent urination, slight burning with urination. Reports you told her to call if any UTI symptoms and Rx would be sent to pharmacy. I explained to patient that you recommend office visits for UTI'S so urine can be checked and cultured. Patient lives in [place] and taking care of her mother and not able to come to [place] today. I asked if she could call her PCP,patient reports it hard to get office visit with PCP. Patient said if you won't send Rx in for her she will go to urgent care.  Please advise

## 2021-03-03 NOTE — Telephone Encounter (Signed)
I do recommend evaluation and a urine culture.   Ok if she wishes to do this at urgent care.

## 2021-03-03 NOTE — Telephone Encounter (Signed)
Patient informed. 

## 2021-04-08 NOTE — Progress Notes (Signed)
66 y.o. G2P2 Married Caucasian female here for annual breast and pelvic exam.   ? ?We follow her for pessary care and refill her vaginal estrogen. ?She has a Gelhorn pessary.  ?She is happy with the pessary.  ? ?Since her last appointment here she has had 2 UTIs and 2 yeast infections--seen at Urgent care. ?She is having urinary frequency today.  ?No dysuria.  ? ?PCP: Bennie Pierini, FNP ? ?No LMP recorded. Patient has had a hysterectomy.     ?  ?    ?Sexually active: No.  ?The current method of family planning is status post hysterectomy.    ?Exercising: No.   Takes care of her mother ?Smoker:  no ? ?Health Maintenance: ?Pap:  2016 at [facility] ?History of abnormal Pap:  Yes, had abnormal pap years ago. Normal on repeat. No treatment. ?MMG:  06-29-20 Neg/BiRads1 ?Colonoscopy: 09-27-19 polyps removed;next 5 years ?BMD:  02-21-14  Result:  normal. ?TDaP:  08-08-20 ?Gardasil:   n/a ?AYT:KZSWFUX blood in the past ?Hep C:donated blood in the past ?Screening Labs:  PCP ? ? reports that she has never smoked. She has never used smokeless tobacco. She reports current alcohol use. She reports that she does not use drugs. ? ?Past Medical History:  ?Diagnosis Date  ? Anxiety   ? Nephrolithiasis   ? ? ?Past Surgical History:  ?Procedure Laterality Date  ? ABDOMINAL HYSTERECTOMY    ? FOOT SURGERY Bilateral   ? NASAL SEPTUM SURGERY    ? TONSILLECTOMY    ? ? ?Current Outpatient Medications  ?Medication Sig Dispense Refill  ? escitalopram (LEXAPRO) 10 MG tablet Take 1 tablet by mouth daily.    ? furosemide (LASIX) 20 MG tablet Take 1 tablet by mouth daily.    ? lisinopril (ZESTRIL) 40 MG tablet Take 1 tablet by mouth daily.    ? allopurinol (ZYLOPRIM) 100 MG tablet Take 1 tablet (100 mg total) by mouth daily. 90 tablet 1  ? aspirin EC 81 MG tablet Take 81 mg by mouth daily.    ? atorvastatin (LIPITOR) 20 MG tablet Take 1 tablet (20 mg total) by mouth daily. 90 tablet 1  ? clonazePAM (KLONOPIN) 0.5 MG tablet Take 1  tablet (0.5 mg total) by mouth 2 (two) times daily as needed for anxiety. 60 tablet 5  ? doxepin (SINEQUAN) 10 MG capsule TAKE 1 CAPSULE (10 MG TOTAL) BY MOUTH AT BEDTIME AS NEEDED. 90 capsule 1  ? estradiol (ESTRACE) 0.1 MG/GM vaginal cream Use 1/2 g vaginally two or three times per week as needed to maintain symptom relief. 42.5 g 1  ? fenofibrate (TRICOR) 145 MG tablet Take 1 tablet (145 mg total) by mouth daily. 90 tablet 1  ? fluticasone (FLONASE) 50 MCG/ACT nasal spray PLACE 1 SPRAY INTO BOTH NOSTRILS 2 (TWO) TIMES DAILY AS NEEDED FOR ALLERGIES OR RHINITIS. 48 mL 1  ? lisinopril (ZESTRIL) 40 MG tablet Take 1 tablet (40 mg total) by mouth daily. 90 tablet 1  ? Multiple Vitamin (MULTI-DAY PO) Take 1 tablet by mouth daily. L-Lysine    ? Probiotic Product (PROBIOTIC PO)     ? ?No current facility-administered medications for this visit.  ? ? ?Family History  ?Problem Relation Age of Onset  ? Heart disease Mother   ? Kidney disease Mother   ? Cancer Father   ?     prostate  ? Heart disease Father   ? Kidney disease Father   ? ? ?Review of Systems  ?  All other systems reviewed and are negative. ? ?Exam:   ?BP 120/78   Pulse 84   Ht 5\' 5"  (1.651 m)   Wt 196 lb (88.9 kg)   SpO2 98%   BMI 32.62 kg/m?     ?General appearance: alert, cooperative and appears stated age ?Head: normocephalic, without obvious abnormality, atraumatic ?Neck: no adenopathy, supple, symmetrical, trachea midline and thyroid normal to inspection and palpation ?Lungs: clear to auscultation bilaterally ?Breasts: normal appearance, no masses or tenderness, No nipple retraction or dimpling, No nipple discharge or bleeding, No axillary adenopathy ?Heart: regular rate and rhythm ?Abdomen: soft, non-tender; no masses, no organomegaly ?Extremities: extremities normal, atraumatic, no cyanosis or edema ?Skin: skin color, texture, turgor normal. No rashes or lesions ?Lymph nodes: cervical, supraclavicular, and axillary nodes normal. ?Neurologic: grossly  normal ? ?Pelvic: External genitalia:  no lesions ?             No abnormal inguinal nodes palpated. ?             Urethra:  normal appearing urethra with no masses, tenderness or lesions ?             Bartholins and Skenes: normal    ?             Vagina: normal appearing vagina with normal color and discharge, no lesions ?             Cervix:  absent ?             Pap taken: no ?Bimanual Exam:  Uterus: absent ?             Adnexa: no mass, fullness, tenderness ?             Rectal exam: yes.  Confirms. ?             Anus:  normal sphincter tone, no lesions ? ?Gelhorn pessary removed, cleansed, and replaced.  ? ?Chaperone was present for exam:  , CMA ? ?Assessment:   ?Well woman visit with gynecologic exam. ?Status post TVH/anterior colporrhaphy.  ?Cystocele. ?Recurrent UTI.    ?Pessary maintenance.  ?Medication monitoring. ?Urinary frequency.  ?Abnormal urinalysis.   ? ?Plan: ?Mammogram screening discussed. ?Self breast awareness reviewed. ?Pap and HR HPV as above. ?Guidelines for Calcium, Vitamin D, regular exercise program including cardiovascular and weight bearing exercise. ?Urinalysis:  sg 1.020, pH 5.5, 10 - 20 WBC, NX RBC, 6 - 10 squams, mod bacteria. ?UC.  ?Will await for cx result prior to prescribing abx.  ?Refill of vaginal estrogen cream.  I recommend 1/2 gram pv and peas size amount to urethra at hs 2 - 3 times per week.   Potential effect on breast cancer noted.  ?Fu in 3 months for pessary check.  ? ?After visit summary provided.  ? ?28 min  total time was spent for this patient encounter, including preparation, face-to-face counseling with the patient, coordination of care, and documentation of the encounter. ? ? ? ? ?

## 2021-04-09 ENCOUNTER — Encounter: Payer: Self-pay | Admitting: Obstetrics and Gynecology

## 2021-04-09 ENCOUNTER — Other Ambulatory Visit: Payer: Self-pay

## 2021-04-09 ENCOUNTER — Ambulatory Visit (INDEPENDENT_AMBULATORY_CARE_PROVIDER_SITE_OTHER): Payer: Medicare Other | Admitting: Obstetrics and Gynecology

## 2021-04-09 VITALS — BP 120/78 | HR 84 | Ht 65.0 in | Wt 196.0 lb

## 2021-04-09 DIAGNOSIS — Z4689 Encounter for fitting and adjustment of other specified devices: Secondary | ICD-10-CM

## 2021-04-09 DIAGNOSIS — Z5181 Encounter for therapeutic drug level monitoring: Secondary | ICD-10-CM | POA: Diagnosis not present

## 2021-04-09 DIAGNOSIS — R350 Frequency of micturition: Secondary | ICD-10-CM | POA: Diagnosis not present

## 2021-04-09 DIAGNOSIS — N8110 Cystocele, unspecified: Secondary | ICD-10-CM

## 2021-04-09 DIAGNOSIS — Z01419 Encounter for gynecological examination (general) (routine) without abnormal findings: Secondary | ICD-10-CM | POA: Diagnosis not present

## 2021-04-09 MED ORDER — ESTRADIOL 0.1 MG/GM VA CREA: TOPICAL_CREAM | VAGINAL | 1 refills | Status: DC

## 2021-04-09 NOTE — Patient Instructions (Signed)

## 2021-04-11 LAB — URINALYSIS, COMPLETE W/RFL CULTURE
Bilirubin Urine: NEGATIVE
Glucose, UA: NEGATIVE
Hgb urine dipstick: NEGATIVE
Hyaline Cast: NONE SEEN /LPF
Ketones, ur: NEGATIVE
Nitrites, Initial: NEGATIVE
Protein, ur: NEGATIVE
RBC / HPF: NONE SEEN /HPF (ref 0–2)
Specific Gravity, Urine: 1.02 (ref 1.001–1.035)
pH: 5.5 (ref 5.0–8.0)

## 2021-04-11 LAB — URINE CULTURE
MICRO NUMBER:: 13073763
SPECIMEN QUALITY:: ADEQUATE

## 2021-04-11 LAB — CULTURE INDICATED

## 2021-06-12 ENCOUNTER — Other Ambulatory Visit (HOSPITAL_COMMUNITY): Payer: Self-pay

## 2021-07-09 NOTE — Progress Notes (Unsigned)
GYNECOLOGY  VISIT   HPI: 66 y.o.   Married  Caucasian  female   G2P2 with No LMP recorded. Patient has had a hysterectomy.   here for pessary check.  GYNECOLOGIC HISTORY: No LMP recorded. Patient has had a hysterectomy. Contraception:  Hyst Menopausal hormone therapy:  ***Estrace vaginal cream Last mammogram:  06-29-20 Neg/BiRads1  Last pap smear:  2016 at [facility]        OB History     Gravida  2   Para  2   Term      Preterm      AB      Living         SAB      IAB      Ectopic      Multiple      Live Births                 Patient Active Problem List   Diagnosis Date Noted   Primary insomnia 02/06/2021   Gastroesophageal reflux disease without esophagitis 02/06/2020   Vitamin D deficiency 12/01/2016   Hyperlipidemia 11/27/2016   Obesity (BMI 30-39.9) 11/06/2015   Gout 11/17/2013   Essential hypertension, benign 11/17/2013   GAD (generalized anxiety disorder) 11/17/2013    Past Medical History:  Diagnosis Date   Anxiety    Nephrolithiasis     Past Surgical History:  Procedure Laterality Date   ABDOMINAL HYSTERECTOMY     FOOT SURGERY Bilateral    NASAL SEPTUM SURGERY     TONSILLECTOMY      Current Outpatient Medications  Medication Sig Dispense Refill   allopurinol (ZYLOPRIM) 100 MG tablet Take 1 tablet (100 mg total) by mouth daily. 90 tablet 1   aspirin EC 81 MG tablet Take 81 mg by mouth daily.     atorvastatin (LIPITOR) 20 MG tablet Take 1 tablet (20 mg total) by mouth daily. 90 tablet 1   clonazePAM (KLONOPIN) 0.5 MG tablet Take 1 tablet (0.5 mg total) by mouth 2 (two) times daily as needed for anxiety. 60 tablet 5   doxepin (SINEQUAN) 10 MG capsule TAKE 1 CAPSULE (10 MG TOTAL) BY MOUTH AT BEDTIME AS NEEDED. 90 capsule 1   escitalopram (LEXAPRO) 10 MG tablet Take 1 tablet by mouth daily.     estradiol (ESTRACE) 0.1 MG/GM vaginal cream Use 1/2 g vaginally two or three times per week as needed to maintain symptom relief.  42.5 g 1   fenofibrate (TRICOR) 145 MG tablet Take 1 tablet (145 mg total) by mouth daily. 90 tablet 1   fluticasone (FLONASE) 50 MCG/ACT nasal spray PLACE 1 SPRAY INTO BOTH NOSTRILS 2 (TWO) TIMES DAILY AS NEEDED FOR ALLERGIES OR RHINITIS. 48 mL 1   furosemide (LASIX) 20 MG tablet Take 1 tablet by mouth daily.     lisinopril (ZESTRIL) 40 MG tablet Take 1 tablet (40 mg total) by mouth daily. 90 tablet 1   lisinopril (ZESTRIL) 40 MG tablet Take 1 tablet by mouth daily.     Multiple Vitamin (MULTI-DAY PO) Take 1 tablet by mouth daily. L-Lysine     Probiotic Product (PROBIOTIC PO)      No current facility-administered medications for this visit.     ALLERGIES: Amoxicillin, Macrodantin [nitrofurantoin macrocrystal], and Vicodin [hydrocodone-acetaminophen]  Family History  Problem Relation Age of Onset   Heart disease Mother    Kidney disease Mother    Cancer Father        prostate   Heart disease Father  Kidney disease Father     Social History   Socioeconomic History   Marital status: Married    Spouse name: Not on file   Number of children: Not on file   Years of education: Not on file   Highest education level: Not on file  Occupational History   Not on file  Tobacco Use   Smoking status: Never   Smokeless tobacco: Never  Vaping Use   Vaping Use: Never used  Substance and Sexual Activity   Alcohol use: Yes    Comment: 1 drink/month   Drug use: No   Sexual activity: Yes    Birth control/protection: Surgical    Comment: hysterectomy  Other Topics Concern   Not on file  Social History Narrative   Not on file   Social Determinants of Health   Financial Resource Strain: Not on file  Food Insecurity: Not on file  Transportation Needs: Not on file  Physical Activity: Not on file  Stress: Not on file  Social Connections: Not on file  Intimate Partner Violence: Not on file    Review of Systems  PHYSICAL EXAMINATION:    There were no vitals taken for this  visit.    General appearance: alert, cooperative and appears stated age Head: Normocephalic, without obvious abnormality, atraumatic Neck: no adenopathy, supple, symmetrical, trachea midline and thyroid normal to inspection and palpation Lungs: clear to auscultation bilaterally Breasts: normal appearance, no masses or tenderness, No nipple retraction or dimpling, No nipple discharge or bleeding, No axillary or supraclavicular adenopathy Heart: regular rate and rhythm Abdomen: soft, non-tender, no masses,  no organomegaly Extremities: extremities normal, atraumatic, no cyanosis or edema Skin: Skin color, texture, turgor normal. No rashes or lesions Lymph nodes: Cervical, supraclavicular, and axillary nodes normal. No abnormal inguinal nodes palpated Neurologic: Grossly normal  Pelvic: External genitalia:  no lesions              Urethra:  normal appearing urethra with no masses, tenderness or lesions              Bartholins and Skenes: normal                 Vagina: normal appearing vagina with normal color and discharge, no lesions              Cervix: no lesions                Bimanual Exam:  Uterus:  normal size, contour, position, consistency, mobility, non-tender              Adnexa: no mass, fullness, tenderness              Rectal exam: {yes no:[id number]}.  Confirms.              Anus:  normal sphincter tone, no lesions  Chaperone was present for exam:  ***  ASSESSMENT     PLAN     An After Visit Summary was printed and given to the patient.  ______ minutes face to face time of which over 50% was spent in counseling.

## 2021-07-10 ENCOUNTER — Ambulatory Visit (INDEPENDENT_AMBULATORY_CARE_PROVIDER_SITE_OTHER): Payer: Medicare Other | Admitting: Obstetrics and Gynecology

## 2021-07-10 ENCOUNTER — Encounter: Payer: Self-pay | Admitting: Obstetrics and Gynecology

## 2021-07-10 VITALS — BP 120/68 | Ht 65.5 in | Wt 196.0 lb

## 2021-07-10 DIAGNOSIS — Z4689 Encounter for fitting and adjustment of other specified devices: Secondary | ICD-10-CM | POA: Diagnosis not present

## 2021-07-10 DIAGNOSIS — N819 Female genital prolapse, unspecified: Secondary | ICD-10-CM

## 2021-07-16 ENCOUNTER — Other Ambulatory Visit: Payer: Self-pay | Admitting: Nurse Practitioner

## 2021-07-16 DIAGNOSIS — I10 Essential (primary) hypertension: Secondary | ICD-10-CM

## 2021-08-02 ENCOUNTER — Other Ambulatory Visit: Payer: Self-pay | Admitting: Nurse Practitioner

## 2021-08-08 ENCOUNTER — Encounter: Payer: Self-pay | Admitting: Nurse Practitioner

## 2021-08-08 ENCOUNTER — Ambulatory Visit (INDEPENDENT_AMBULATORY_CARE_PROVIDER_SITE_OTHER): Payer: Medicare Other | Admitting: Nurse Practitioner

## 2021-08-08 VITALS — BP 113/67 | HR 76 | Temp 97.1°F | Resp 20 | Ht 65.5 in | Wt 192.0 lb

## 2021-08-08 DIAGNOSIS — E782 Mixed hyperlipidemia: Secondary | ICD-10-CM | POA: Diagnosis not present

## 2021-08-08 DIAGNOSIS — I10 Essential (primary) hypertension: Secondary | ICD-10-CM | POA: Diagnosis not present

## 2021-08-08 DIAGNOSIS — Z6831 Body mass index (BMI) 31.0-31.9, adult: Secondary | ICD-10-CM

## 2021-08-08 DIAGNOSIS — E559 Vitamin D deficiency, unspecified: Secondary | ICD-10-CM

## 2021-08-08 DIAGNOSIS — F411 Generalized anxiety disorder: Secondary | ICD-10-CM

## 2021-08-08 DIAGNOSIS — M1A9XX0 Chronic gout, unspecified, without tophus (tophi): Secondary | ICD-10-CM | POA: Diagnosis not present

## 2021-08-08 DIAGNOSIS — K219 Gastro-esophageal reflux disease without esophagitis: Secondary | ICD-10-CM

## 2021-08-08 DIAGNOSIS — F5101 Primary insomnia: Secondary | ICD-10-CM

## 2021-08-08 MED ORDER — ATORVASTATIN CALCIUM 20 MG PO TABS: 20.0000 mg | ORAL_TABLET | Freq: Every day | ORAL | 1 refills | Status: DC

## 2021-08-08 MED ORDER — LISINOPRIL 40 MG PO TABS: 40.0000 mg | ORAL_TABLET | Freq: Every day | ORAL | 1 refills | Status: DC

## 2021-08-08 MED ORDER — CLONAZEPAM 0.5 MG PO TABS: 0.5000 mg | ORAL_TABLET | Freq: Two times a day (BID) | ORAL | 5 refills | Status: DC | PRN

## 2021-08-08 MED ORDER — ALLOPURINOL 100 MG PO TABS: 100.0000 mg | ORAL_TABLET | Freq: Every day | ORAL | 1 refills | Status: DC

## 2021-08-08 NOTE — Addendum Note (Signed)
Addended by: Bennie Pierini on: 08/08/2021 11:02 AM   Modules accepted: Orders

## 2021-08-08 NOTE — Progress Notes (Signed)
Subjective:    Patient ID: Jane Howard, female    DOB: Aug 16, 1955, 66 y.o.   MRN: [id number]   Chief Complaint: Medical Management of Chronic Issues    HPI:  Jane Howard is a 66 y.o. who identifies as a female who was assigned female at birth.   Social history: Lives with: husband - her mom and grandson live with them Work history: retired   Scientist, forensic in today for follow up of the following chronic medical issues:  1. Essential hypertension, benign No c/o chest pain, sob or headaches. Does not check blood pressure at home. BP Readings from Last 3 Encounters:  08/08/21 113/67  07/10/21 120/68  04/09/21 120/78     2. Mixed hyperlipidemia Not really watching diet and no dedicated exercise. Lab Results  Component Value Date   CHOL 168 02/06/2021   HDL 51 02/06/2021   LDLCALC 96 02/06/2021   TRIG 118 02/06/2021   CHOLHDL 3.3 02/06/2021     3. Gastroesophageal reflux disease without esophagitis On no prescription meds. Denies any recent heart burn  4. Chronic gout without tophus, unspecified cause, unspecified site No recent flare ups  5. Primary insomnia On no sleep aids. Sleeps about 5 hours a night  6. GAD (generalized anxiety disorder) Is on klonopin BID prn.    08/08/2021   10:38 AM 02/06/2021   11:28 AM 08/08/2020    2:23 PM 02/06/2020    2:20 PM  GAD 7 : Generalized Anxiety Score  Nervous, Anxious, on Edge 0 1 0 1  Control/stop worrying 0 0 0 1  Worry too much - different things 0 0 0 0  Trouble relaxing 0 1 1 1   Restless 0 0 0 1  Easily annoyed or irritable 0 0 0 0  Afraid - awful might happen 0 0 0 0  Total GAD 7 Score 0 2 1 4   Anxiety Difficulty Not difficult at all Not difficult at all Not difficult at all Not difficult at all      7. Vitamin D deficiency Is on daily vitamin d supplement  8. Obesity (BMI 32-32.9) Weight is down 4lbs Wt Readings from Last 3 Encounters:  08/08/21 192 lb (87.1 kg)  07/10/21 196 lb (88.9 kg)   04/09/21 196 lb (88.9 kg)   BMI Readings from Last 3 Encounters:  08/08/21 31.46 kg/m  07/10/21 32.12 kg/m  04/09/21 32.62 kg/m      New complaints: None today  Allergies  Allergen Reactions   Amoxicillin Other (See Comments)    Causes yeast infection   Macrodantin [Nitrofurantoin Macrocrystal]    Vicodin [Hydrocodone-Acetaminophen]    Outpatient Encounter Medications as of 08/08/2021  Medication Sig   allopurinol (ZYLOPRIM) 100 MG tablet Take 1 tablet (100 mg total) by mouth daily.   aspirin EC 81 MG tablet Take 81 mg by mouth daily.   atorvastatin (LIPITOR) 20 MG tablet Take 1 tablet (20 mg total) by mouth daily.   Calcium Carb-Cholecalciferol (CALCIUM 500+D) 500-5 MG-MCG TABS Take 1 tablet by mouth daily.   Cholecalciferol (VITAMIN D3) 125 MCG (5000 UT) CAPS Take 1 capsule by mouth daily.   clonazePAM (KLONOPIN) 0.5 MG tablet Take 1 tablet (0.5 mg total) by mouth 2 (two) times daily as needed for anxiety.   escitalopram (LEXAPRO) 10 MG tablet Take 1 tablet by mouth daily.   estradiol (ESTRACE) 0.1 MG/GM vaginal cream Use 1/2 g vaginally two or three times per week as needed to maintain symptom relief.   fluticasone (FLONASE)  50 MCG/ACT nasal spray PLACE 1 SPRAY INTO BOTH NOSTRILS 2 (TWO) TIMES DAILY AS NEEDED FOR ALLERGIES OR RHINITIS.   lisinopril (ZESTRIL) 40 MG tablet TAKE 1 TABLET BY MOUTH EVERY DAY   Multiple Vitamin (MULTI-DAY PO) Take 1 tablet by mouth daily. L-Lysine   Probiotic Product (PROBIOTIC PO)    [DISCONTINUED] fenofibrate (TRICOR) 145 MG tablet Take 1 tablet (145 mg total) by mouth daily.   No facility-administered encounter medications on file as of 08/08/2021.    Past Surgical History:  Procedure Laterality Date   ABDOMINAL HYSTERECTOMY     FOOT SURGERY Bilateral    NASAL SEPTUM SURGERY     TONSILLECTOMY      Family History  Problem Relation Age of Onset   Heart disease Mother    Kidney disease Mother    Cancer Father        prostate    Heart disease Father    Kidney disease Father       Controlled substance contract: 08/13/20-  will do drug screen at next visit when sign new contract     Review of Systems  Constitutional:  Negative for diaphoresis.  Eyes:  Negative for pain.  Respiratory:  Negative for shortness of breath.   Cardiovascular:  Negative for chest pain, palpitations and leg swelling.  Gastrointestinal:  Negative for abdominal pain.  Endocrine: Negative for polydipsia.  Skin:  Negative for rash.  Neurological:  Negative for dizziness, weakness and headaches.  Hematological:  Does not bruise/bleed easily.  All other systems reviewed and are negative.      Objective:   Physical Exam Vitals and nursing note reviewed.  Constitutional:      General: She is not in acute distress.    Appearance: Normal appearance. She is well-developed.  HENT:     Head: Normocephalic.     Right Ear: Tympanic membrane normal.     Left Ear: Tympanic membrane normal.     Nose: Nose normal.     Mouth/Throat:     Mouth: Mucous membranes are moist.  Eyes:     Pupils: Pupils are equal, round, and reactive to light.  Neck:     Vascular: No carotid bruit or JVD.  Cardiovascular:     Rate and Rhythm: Normal rate and regular rhythm.     Heart sounds: Normal heart sounds.  Pulmonary:     Effort: Pulmonary effort is normal. No respiratory distress.     Breath sounds: Normal breath sounds. No wheezing or rales.  Chest:     Chest wall: No tenderness.  Abdominal:     General: Bowel sounds are normal. There is no distension or abdominal bruit.     Palpations: Abdomen is soft. There is no hepatomegaly, splenomegaly, mass or pulsatile mass.     Tenderness: There is no abdominal tenderness.  Musculoskeletal:        General: Normal range of motion.     Cervical back: Normal range of motion and neck supple.  Lymphadenopathy:     Cervical: No cervical adenopathy.  Skin:    General: Skin is warm and dry.  Neurological:      Mental Status: She is alert and oriented to person, place, and time.     Deep Tendon Reflexes: Reflexes are normal and symmetric.  Psychiatric:        Behavior: Behavior normal.        Thought Content: Thought content normal.        Judgment: Judgment normal.    BP  113/67   Pulse 76   Temp (!) 97.1 F (36.2 C)   Resp 20   Ht 5' 5.5" (1.664 m)   Wt 192 lb (87.1 kg)   SpO2 99%   BMI 31.46 kg/m         Assessment & Plan:  MISTEY HOFFERT comes in today with chief complaint of Medical Management of Chronic Issues   Diagnosis and orders addressed:  1. Essential hypertension, benign Low sodium diet - lisinopril (ZESTRIL) 40 MG tablet; Take 1 tablet (40 mg total) by mouth daily.  Dispense: 90 tablet; Refill: 1  2. Mixed hyperlipidemia Low fat diet - atorvastatin (LIPITOR) 20 MG tablet; Take 1 tablet (20 mg total) by mouth daily.  Dispense: 90 tablet; Refill: 1  3. Gastroesophageal reflux disease without esophagitis Avoid spicy foods Do not eat 2 hours prior to bedtime  4. Chronic gout without tophus, unspecified cause, unspecified site  - allopurinol (ZYLOPRIM) 100 MG tablet; Take 1 tablet (100 mg total) by mouth daily.  Dispense: 90 tablet; Refill: 1  5. Primary insomnia Bedtime routine  6. GAD (generalized anxiety disorder) Stress manaegement - clonazePAM (KLONOPIN) 0.5 MG tablet; Take 1 tablet (0.5 mg total) by mouth 2 (two) times daily as needed for anxiety.  Dispense: 60 tablet; Refill: 5  7. Vitamin D deficiency Continue daily vitamin d supplement  8. BMI 31.0-31.9,adult Discussed diet and exercise for person with BMI >25 Will recheck weight in 3-6 months    Labs pending Health Maintenance reviewed Diet and exercise encouraged  Follow up plan: 6 months   Jane Daphine Deutscher, FNP

## 2021-08-08 NOTE — Patient Instructions (Signed)
Exercising to Stay Healthy To become healthy and stay healthy, it is recommended that you do moderate-intensity and vigorous-intensity exercise. You can tell that you are exercising at a moderate intensity if your heart starts beating faster and you start breathing faster but can still hold a conversation. You can tell that you are exercising at a vigorous intensity if you are breathing much harder and faster and cannot hold a conversation while exercising. How can exercise benefit me? Exercising regularly is important. It has many health benefits, such as: Improving overall fitness, flexibility, and endurance. Increasing bone density. Helping with weight control. Decreasing body fat. Increasing muscle strength and endurance. Reducing stress and tension, anxiety, depression, or anger. Improving overall health. What guidelines should I follow while exercising? Before you start a new exercise program, talk with your health care provider. Do not exercise so much that you hurt yourself, feel dizzy, or get very short of breath. Wear comfortable clothes and wear shoes with good support. Drink plenty of water while you exercise to prevent dehydration or heat stroke. Work out until your breathing and your heartbeat get faster (moderate intensity). How often should I exercise? Choose an activity that you enjoy, and set realistic goals. Your health care provider can help you make an activity plan that is individually designed and works best for you. Exercise regularly as told by your health care provider. This may include: Doing strength training two times a week, such as: Lifting weights. Using resistance bands. Push-ups. Sit-ups. Yoga. Doing a certain intensity of exercise for a given amount of time. Choose from these options: A total of 150 minutes of moderate-intensity exercise every week. A total of 75 minutes of vigorous-intensity exercise every week. A mix of moderate-intensity and  vigorous-intensity exercise every week. Children, pregnant women, people who have not exercised regularly, people who are overweight, and older adults may need to talk with a health care provider about what activities are safe to perform. If you have a medical condition, be sure to talk with your health care provider before you start a new exercise program. What are some exercise ideas? Moderate-intensity exercise ideas include: Walking 1 mile (1.6 km) in about 15 minutes. Biking. Hiking. Golfing. Dancing. Water aerobics. Vigorous-intensity exercise ideas include: Walking 4.5 miles (7.2 km) or more in about 1 hour. Jogging or running 5 miles (8 km) in about 1 hour. Biking 10 miles (16.1 km) or more in about 1 hour. Lap swimming. Roller-skating or in-line skating. Cross-country skiing. Vigorous competitive sports, such as football, basketball, and soccer. Jumping rope. Aerobic dancing. What are some everyday activities that can help me get exercise? Yard work, such as: Pushing a lawn mower. Raking and bagging leaves. Washing your car. Pushing a stroller. Shoveling snow. Gardening. Washing windows or floors. How can I be more active in my day-to-day activities? Use stairs instead of an elevator. Take a walk during your lunch break. If you drive, park your car farther away from your work or school. If you take public transportation, get off one stop early and walk the rest of the way. Stand up or walk around during all of your indoor phone calls. Get up, stretch, and walk around every 30 minutes throughout the day. Enjoy exercise with a friend. Support to continue exercising will help you keep a regular routine of activity. Where to find more information You can find more information about exercising to stay healthy from: U.S. Department of Health and Human Services: www.hhs.gov Centers for Disease Control and Prevention (  CDC): www.cdc.gov Summary Exercising regularly is  important. It will improve your overall fitness, flexibility, and endurance. Regular exercise will also improve your overall health. It can help you control your weight, reduce stress, and improve your bone density. Do not exercise so much that you hurt yourself, feel dizzy, or get very short of breath. Before you start a new exercise program, talk with your health care provider. This information is not intended to replace advice given to you by your health care provider. Make sure you discuss any questions you have with your health care provider. Document Revised: 05/24/2020 Document Reviewed: 05/24/2020 Elsevier Patient Education  2023 Elsevier Inc.  

## 2021-08-09 LAB — CMP14+EGFR
ALT: 26 IU/L (ref 0–32)
AST: 18 IU/L (ref 0–40)
Albumin/Globulin Ratio: 1.7 (ref 1.2–2.2)
Albumin: 4.5 g/dL (ref 3.8–4.8)
Alkaline Phosphatase: 92 IU/L (ref 44–121)
BUN/Creatinine Ratio: 20 (ref 12–28)
BUN: 17 mg/dL (ref 8–27)
Bilirubin Total: 0.3 mg/dL (ref 0.0–1.2)
CO2: 25 mmol/L (ref 20–29)
Calcium: 9.8 mg/dL (ref 8.7–10.3)
Chloride: 101 mmol/L (ref 96–106)
Creatinine, Ser: 0.83 mg/dL (ref 0.57–1.00)
Globulin, Total: 2.7 g/dL (ref 1.5–4.5)
Glucose: 101 mg/dL — ABNORMAL HIGH (ref 70–99)
Potassium: 5.3 mmol/L — ABNORMAL HIGH (ref 3.5–5.2)
Sodium: 140 mmol/L (ref 134–144)
Total Protein: 7.2 g/dL (ref 6.0–8.5)
eGFR: 78 mL/min/{1.73_m2} (ref >59)

## 2021-08-09 LAB — CBC WITH DIFFERENTIAL/PLATELET
Basophils Absolute: 0.1 10*3/uL (ref 0.0–0.2)
Basos: 1 %
EOS (ABSOLUTE): 0.1 10*3/uL (ref 0.0–0.4)
Eos: 1 %
Hematocrit: 42.2 % (ref 34.0–46.6)
Hemoglobin: 14.4 g/dL (ref 11.1–15.9)
Immature Grans (Abs): 0 10*3/uL (ref 0.0–0.1)
Immature Granulocytes: 0 %
Lymphocytes Absolute: 2.7 10*3/uL (ref 0.7–3.1)
Lymphs: 37 %
MCH: 30.1 pg (ref 26.6–33.0)
MCHC: 34.1 g/dL (ref 31.5–35.7)
MCV: 88 fL (ref 79–97)
Monocytes Absolute: 0.4 10*3/uL (ref 0.1–0.9)
Monocytes: 6 %
Neutrophils Absolute: 4 10*3/uL (ref 1.4–7.0)
Neutrophils: 55 %
Platelets: 281 10*3/uL (ref 150–450)
RBC: 4.78 x10E6/uL (ref 3.77–5.28)
RDW: 12 % (ref 11.7–15.4)
WBC: 7.3 10*3/uL (ref 3.4–10.8)

## 2021-08-09 LAB — LIPID PANEL
Chol/HDL Ratio: 3.9 ratio (ref 0.0–4.4)
Cholesterol, Total: 194 mg/dL (ref 100–199)
HDL: 50 mg/dL (ref >39)
LDL Chol Calc (NIH): 102 mg/dL — ABNORMAL HIGH (ref 0–99)
Triglycerides: 250 mg/dL — ABNORMAL HIGH (ref 0–149)
VLDL Cholesterol Cal: 42 mg/dL — ABNORMAL HIGH (ref 5–40)

## 2021-10-14 ENCOUNTER — Ambulatory Visit: Payer: Medicare Other | Admitting: Obstetrics and Gynecology

## 2021-10-15 NOTE — Progress Notes (Signed)
GYNECOLOGY  VISIT   HPI: 66 y.o.   Married  Caucasian  female   G2P2 with No LMP recorded. Patient has had a hysterectomy.   here for pessary check. Using a Gelhorn pessary.   Forgot her estrogen cream today.  Usually uses it twice a week.  Does not need a refill.   No vaginal bleeding, discharge or loss of bladder or bowel control.   She is considering prolapse surgery for next year.   She lost mom in July. Has a good support system.  GYNECOLOGIC HISTORY: No LMP recorded. Patient has had a hysterectomy. Contraception:  Hyst Menopausal hormone therapy: Estrogen vaginal cream Last mammogram:   06-29-20 Neg/BiRads1 -- knows to schedule Last pap smear:   2016 at [facility]        OB History     Gravida  2   Para  2   Term      Preterm      AB      Living         SAB      IAB      Ectopic      Multiple      Live Births                 Patient Active Problem List   Diagnosis Date Noted   Primary insomnia 02/06/2021   Gastroesophageal reflux disease without esophagitis 02/06/2020   Vitamin D deficiency 12/01/2016   Hyperlipidemia 11/27/2016   Obesity (BMI 30-39.9) 11/06/2015   Gout 11/17/2013   Essential hypertension, benign 11/17/2013   GAD (generalized anxiety disorder) 11/17/2013    Past Medical History:  Diagnosis Date   Anxiety    Nephrolithiasis     Past Surgical History:  Procedure Laterality Date   ABDOMINAL HYSTERECTOMY     FOOT SURGERY Bilateral    NASAL SEPTUM SURGERY     TONSILLECTOMY      Current Outpatient Medications  Medication Sig Dispense Refill   allopurinol (ZYLOPRIM) 100 MG tablet Take 1 tablet (100 mg total) by mouth daily. 90 tablet 1   aspirin EC 81 MG tablet Take 81 mg by mouth daily.     atorvastatin (LIPITOR) 20 MG tablet Take 1 tablet (20 mg total) by mouth daily. 90 tablet 1   Calcium Carb-Cholecalciferol (CALCIUM 500+D) 500-5 MG-MCG TABS Take 1 tablet by mouth daily.     Cholecalciferol (VITAMIN  D3) 125 MCG (5000 UT) CAPS Take 1 capsule by mouth daily.     clonazePAM (KLONOPIN) 0.5 MG tablet Take 1 tablet (0.5 mg total) by mouth 2 (two) times daily as needed for anxiety. 60 tablet 5   escitalopram (LEXAPRO) 10 MG tablet Take 1 tablet by mouth daily.     estradiol (ESTRACE) 0.1 MG/GM vaginal cream Use 1/2 g vaginally two or three times per week as needed to maintain symptom relief. 42.5 g 1   fluticasone (FLONASE) 50 MCG/ACT nasal spray PLACE 1 SPRAY INTO BOTH NOSTRILS 2 (TWO) TIMES DAILY AS NEEDED FOR ALLERGIES OR RHINITIS. 48 mL 1   hydrochlorothiazide (HYDRODIURIL) 50 MG tablet      lisinopril (ZESTRIL) 40 MG tablet Take 1 tablet (40 mg total) by mouth daily. 90 tablet 1   Multiple Vitamin (MULTI-DAY PO) Take 1 tablet by mouth daily. L-Lysine     nystatin ointment (MYCOSTATIN) APPLY TO THE AFFECTED AREA(S) BY TOPICAL ROUTE 2 TIMES PER DAY     Probiotic Product (PROBIOTIC PO)      triamcinolone  ointment (KENALOG) 0.1 % APPLY A THIN LAYER TO THE AFFECTED AREA(S) BY TOPICAL ROUTE 2 TIMES PER DAY     No current facility-administered medications for this visit.     ALLERGIES: Amoxicillin, Macrodantin [nitrofurantoin macrocrystal], and Vicodin [hydrocodone-acetaminophen]  Family History  Problem Relation Age of Onset   Heart disease Mother    Kidney disease Mother    Cancer Father        prostate   Heart disease Father    Kidney disease Father     Social History   Socioeconomic History   Marital status: Married    Spouse name: Not on file   Number of children: Not on file   Years of education: Not on file   Highest education level: Not on file  Occupational History   Not on file  Tobacco Use   Smoking status: Never   Smokeless tobacco: Never  Vaping Use   Vaping Use: Never used  Substance and Sexual Activity   Alcohol use: Yes    Comment: 1 drink/month   Drug use: No   Sexual activity: Yes    Birth control/protection: Surgical    Comment: hysterectomy  Other  Topics Concern   Not on file  Social History Narrative   Not on file   Social Determinants of Health   Financial Resource Strain: Not on file  Food Insecurity: Not on file  Transportation Needs: Not on file  Physical Activity: Not on file  Stress: Not on file  Social Connections: Not on file  Intimate Partner Violence: Not on file    Review of Systems  All other systems reviewed and are negative.   PHYSICAL EXAMINATION:    BP (!) 140/76   Pulse 82   Ht 5' 5.5" (1.664 m)   Wt 196 lb (88.9 kg)   SpO2 95%   BMI 32.12 kg/m     General appearance: alert, cooperative and appears stated age   Pelvic: External genitalia:  no lesions              Urethra:  normal appearing urethra with no masses, tenderness or lesions              Bartholins and Skenes: normal                 Vagina: normal appearing vagina with normal color and discharge, no lesions              Cervix: absent                Bimanual Exam:  Uterus:  absent              Adnexa: no mass, fullness, tenderness      Gelhorn pessary removed, cleansed, and replaced.  Surgilube used.   Chaperone was present for exam:  Marchelle Folks, CMA  ASSESSMENT  Status post TVH/anterior colporrhaphy.  Pelvic organ relaxation.  Cystocele.  Midline. Pessary maintenance.  Doing well with Gelhorn pessary.  PLAN  Continue pessary use.  Continue vaginal estrogen cream 2 - 3 times per week.  She will update her mammogram.   We discussed the potential effect of estrogen cream on breast cancer.  Fu in 3 months for next pessary check.  She will have a breast and pelvic exam in about 6 months.   An After Visit Summary was printed and given to the patient.

## 2021-10-16 ENCOUNTER — Encounter: Payer: Self-pay | Admitting: Obstetrics and Gynecology

## 2021-10-16 ENCOUNTER — Ambulatory Visit (INDEPENDENT_AMBULATORY_CARE_PROVIDER_SITE_OTHER): Payer: Medicare Other | Admitting: Obstetrics and Gynecology

## 2021-10-16 VITALS — BP 140/76 | HR 82 | Ht 65.5 in | Wt 196.0 lb

## 2021-10-16 DIAGNOSIS — N8111 Cystocele, midline: Secondary | ICD-10-CM | POA: Diagnosis not present

## 2021-10-16 DIAGNOSIS — Z4689 Encounter for fitting and adjustment of other specified devices: Secondary | ICD-10-CM

## 2021-10-28 ENCOUNTER — Encounter: Payer: Self-pay | Admitting: Family Medicine

## 2021-10-28 ENCOUNTER — Telehealth (INDEPENDENT_AMBULATORY_CARE_PROVIDER_SITE_OTHER): Payer: Medicare Other | Admitting: Family Medicine

## 2021-10-28 DIAGNOSIS — N12 Tubulo-interstitial nephritis, not specified as acute or chronic: Secondary | ICD-10-CM

## 2021-10-28 DIAGNOSIS — N200 Calculus of kidney: Secondary | ICD-10-CM

## 2021-10-28 MED ORDER — CEPHALEXIN 500 MG PO CAPS: 500.0000 mg | ORAL_CAPSULE | Freq: Four times a day (QID) | ORAL | 0 refills | Status: AC

## 2021-10-28 MED ORDER — ACETAMINOPHEN-CODEINE 300-30 MG PO TABS: 1.0000 | ORAL_TABLET | Freq: Four times a day (QID) | ORAL | 0 refills | Status: DC | PRN

## 2021-10-28 MED ORDER — FLUCONAZOLE 150 MG PO TABS: 150.0000 mg | ORAL_TABLET | Freq: Once | ORAL | 0 refills | Status: AC

## 2021-10-28 MED ORDER — TAMSULOSIN HCL 0.4 MG PO CAPS: 0.4000 mg | ORAL_CAPSULE | Freq: Every day | ORAL | 3 refills | Status: DC | PRN

## 2021-10-28 NOTE — Progress Notes (Signed)
MyChart Video visit  Subjective: WN:IOEVOJ stone PCP: Chevis Pretty, FNP [id number] Jane Howard is a 66 y.o. female. Patient provides verbal consent for consult held via video.  Due to COVID-19 pandemic this visit was conducted virtually. This visit type was conducted due to national recommendations for restrictions regarding the COVID-19 Pandemic (e.g. social distancing, sheltering in place) in an effort to limit this patient's exposure and mitigate transmission in our community. All issues noted in this document were discussed and addressed.  A physical exam was not performed with this format.   Location of patient: home Location of provider: [facility] Others present for call: none  1. Renal stone She reports vomiting Saturday am. Now she is running a fever 102.108F.  She has history of recurrent renal stones.  She reports hematuria.  She has gotten pyelonephritis with previous renal stents in the past.  Used to see a urologist at [facility] urology but he has since retired and she has not followed up since.  She has had to have surgical intervention to retrieve these in the past.  She had 3 days left of Flomax and so she used those but is asking for renewal on that.   ROS: Per HPI  Allergies  Allergen Reactions   Amoxicillin Other (See Comments)    Causes yeast infection   Macrodantin [Nitrofurantoin Macrocrystal]    Vicodin [Hydrocodone-Acetaminophen]    Past Medical History:  Diagnosis Date   Anxiety    Nephrolithiasis     Current Outpatient Medications:    allopurinol (ZYLOPRIM) 100 MG tablet, Take 1 tablet (100 mg total) by mouth daily., Disp: 90 tablet, Rfl: 1   aspirin EC 81 MG tablet, Take 81 mg by mouth daily., Disp: , Rfl:    atorvastatin (LIPITOR) 20 MG tablet, Take 1 tablet (20 mg total) by mouth daily., Disp: 90 tablet, Rfl: 1   Calcium Carb-Cholecalciferol (CALCIUM 500+Jane) 500-5 MG-MCG TABS, Take 1 tablet by mouth daily., Disp: , Rfl:    Cholecalciferol (VITAMIN D3)  125 MCG (5000 UT) CAPS, Take 1 capsule by mouth daily., Disp: , Rfl:    clonazePAM (KLONOPIN) 0.5 MG tablet, Take 1 tablet (0.5 mg total) by mouth 2 (two) times daily as needed for anxiety., Disp: 60 tablet, Rfl: 5   escitalopram (LEXAPRO) 10 MG tablet, Take 1 tablet by mouth daily., Disp: , Rfl:    estradiol (ESTRACE) 0.1 MG/GM vaginal cream, Use 1/2 g vaginally two or three times per week as needed to maintain symptom relief., Disp: 42.5 g, Rfl: 1   fluticasone (FLONASE) 50 MCG/ACT nasal spray, PLACE 1 SPRAY INTO BOTH NOSTRILS 2 (TWO) TIMES DAILY AS NEEDED FOR ALLERGIES OR RHINITIS., Disp: 48 mL, Rfl: 1   hydrochlorothiazide (HYDRODIURIL) 50 MG tablet, , Disp: , Rfl:    lisinopril (ZESTRIL) 40 MG tablet, Take 1 tablet (40 mg total) by mouth daily., Disp: 90 tablet, Rfl: 1   Multiple Vitamin (MULTI-DAY PO), Take 1 tablet by mouth daily. L-Lysine, Disp: , Rfl:    nystatin ointment (MYCOSTATIN), APPLY TO THE AFFECTED AREA(S) BY TOPICAL ROUTE 2 TIMES PER DAY, Disp: , Rfl:    Probiotic Product (PROBIOTIC PO), , Disp: , Rfl:    triamcinolone ointment (KENALOG) 0.1 %, APPLY A THIN LAYER TO THE AFFECTED AREA(S) BY TOPICAL ROUTE 2 TIMES PER DAY, Disp: , Rfl:    Gen: Nontoxic female in no acute distress  Assessment/ Plan: 65 y.o. female   Right nephrolithiasis - Plan: tamsulosin (FLOMAX) 0.4 MG CAPS capsule, acetaminophen-codeine (TYLENOL #3) 300-30  MG tablet  Pyelonephritis - Plan: cephALEXin (KEFLEX) 500 MG capsule, fluconazole (DIFLUCAN) 150 MG tablet  Examination is limited by video visit.  I am going to place her on Flomax, given her pain medication as well as covered her for presumed pyelonephritis.  We discussed red flag signs and symptoms and reasons for reevaluation.  I offered CT renal scan scan but she would like to wait for 48 hours to see if she responds to medications.  We discussed if no significant response or if having ongoing fevers in the next 2 days, low threshold to have CT renal  stone scan and urgent referral to urology.  We will CC PCP for follow-up on this matter  Additionally, I cautioned use of benzodiazepine with opioid.  We discussed the risks of respiratory depression other poor outcomes with this combination  The Narcotic Database has been reviewed.  There were no red flags.    Start time: 4:29pm End time: 4:36pm  Total time spent on patient care (including video visit/ documentation): 7 minutes  Jane Pizzuto Hulen Skains, DO [facility] [phone]

## 2021-11-26 ENCOUNTER — Other Ambulatory Visit: Payer: Self-pay | Admitting: Nurse Practitioner

## 2021-11-26 DIAGNOSIS — Z1231 Encounter for screening mammogram for malignant neoplasm of breast: Secondary | ICD-10-CM

## 2021-12-01 ENCOUNTER — Ambulatory Visit (INDEPENDENT_AMBULATORY_CARE_PROVIDER_SITE_OTHER): Payer: Medicare Other

## 2021-12-01 DIAGNOSIS — Z23 Encounter for immunization: Secondary | ICD-10-CM

## 2022-01-08 NOTE — Progress Notes (Signed)
GYNECOLOGY  VISIT   HPI: 66 y.o.   Married  Caucasian  female   G2P2 with No LMP recorded. Patient has had a hysterectomy.   here for   pessary f/u  She has a Gelhorn pessary.   No bleeding, discharge or pain.  Does not feel the pessary.  She is pleased with her pessary usage.  Using estrogen cream twice a week.  Does not need refill.   GYNECOLOGIC HISTORY: No LMP recorded. Patient has had a hysterectomy. Contraception:  hysterectomy Menopausal hormone therapy:  estrace vaginal cream Last mammogram:  01/13/22 - BI-RADS1, cat C density, 06/29/20, Breast Density Category B, BI-RADS CATEGORY 1 Negative Last pap smear:   2016 at [facility]        OB History     Gravida  2   Para  2   Term      Preterm      AB      Living         SAB      IAB      Ectopic      Multiple      Live Births                 Patient Active Problem List   Diagnosis Date Noted   Primary insomnia 02/06/2021   Gastroesophageal reflux disease without esophagitis 02/06/2020   Vitamin D deficiency 12/01/2016   Hyperlipidemia 11/27/2016   Obesity (BMI 30-39.9) 11/06/2015   Gout 11/17/2013   Essential hypertension, benign 11/17/2013   GAD (generalized anxiety disorder) 11/17/2013    Past Medical History:  Diagnosis Date   Anxiety    Nephrolithiasis     Past Surgical History:  Procedure Laterality Date   ABDOMINAL HYSTERECTOMY     FOOT SURGERY Bilateral    NASAL SEPTUM SURGERY     TONSILLECTOMY      Current Outpatient Medications  Medication Sig Dispense Refill   acetaminophen-codeine (TYLENOL #3) 300-30 MG tablet Take 1 tablet by mouth every 6 (six) hours as needed for moderate pain. 20 tablet 0   allopurinol (ZYLOPRIM) 100 MG tablet Take 1 tablet (100 mg total) by mouth daily. 90 tablet 1   aspirin EC 81 MG tablet Take 81 mg by mouth daily.     atorvastatin (LIPITOR) 20 MG tablet Take 1 tablet (20 mg total) by mouth daily. 90 tablet 1   Calcium  Carb-Cholecalciferol (CALCIUM 500+D) 500-5 MG-MCG TABS Take 1 tablet by mouth daily.     Cholecalciferol (VITAMIN D3) 125 MCG (5000 UT) CAPS Take 1 capsule by mouth daily.     clonazePAM (KLONOPIN) 0.5 MG tablet Take 1 tablet (0.5 mg total) by mouth 2 (two) times daily as needed for anxiety. 60 tablet 5   escitalopram (LEXAPRO) 10 MG tablet Take 1 tablet by mouth daily.     estradiol (ESTRACE) 0.1 MG/GM vaginal cream Use 1/2 g vaginally two or three times per week as needed to maintain symptom relief. 42.5 g 1   fluticasone (FLONASE) 50 MCG/ACT nasal spray PLACE 1 SPRAY INTO BOTH NOSTRILS 2 (TWO) TIMES DAILY AS NEEDED FOR ALLERGIES OR RHINITIS. 48 mL 1   hydrochlorothiazide (HYDRODIURIL) 50 MG tablet      lisinopril (ZESTRIL) 40 MG tablet Take 1 tablet (40 mg total) by mouth daily. 90 tablet 1   Multiple Vitamin (MULTI-DAY PO) Take 1 tablet by mouth daily. L-Lysine     nystatin ointment (MYCOSTATIN) APPLY TO THE AFFECTED AREA(S) BY TOPICAL ROUTE 2  TIMES PER DAY     Probiotic Product (PROBIOTIC PO)      tamsulosin (FLOMAX) 0.4 MG CAPS capsule Take 1 capsule (0.4 mg total) by mouth daily as needed. 30 capsule 3   triamcinolone ointment (KENALOG) 0.1 % APPLY A THIN LAYER TO THE AFFECTED AREA(S) BY TOPICAL ROUTE 2 TIMES PER DAY     No current facility-administered medications for this visit.     ALLERGIES: Amoxicillin, Macrodantin [nitrofurantoin macrocrystal], and Vicodin [hydrocodone-acetaminophen]  Family History  Problem Relation Age of Onset   Heart disease Mother    Kidney disease Mother    Cancer Father        prostate   Heart disease Father    Kidney disease Father     Social History   Socioeconomic History   Marital status: Married    Spouse name: Not on file   Number of children: Not on file   Years of education: Not on file   Highest education level: Not on file  Occupational History   Not on file  Tobacco Use   Smoking status: Never   Smokeless tobacco: Never  Vaping  Use   Vaping Use: Never used  Substance and Sexual Activity   Alcohol use: Yes    Comment: 1 drink/month   Drug use: No   Sexual activity: Yes    Birth control/protection: Surgical    Comment: hysterectomy  Other Topics Concern   Not on file  Social History Narrative   Not on file   Social Determinants of Health   Financial Resource Strain: Not on file  Food Insecurity: Not on file  Transportation Needs: Not on file  Physical Activity: Not on file  Stress: Not on file  Social Connections: Not on file  Intimate Partner Violence: Not on file    Review of Systems  All other systems reviewed and are negative.   PHYSICAL EXAMINATION:    BP 128/80 (BP Location: Right Arm, Patient Position: Sitting, Cuff Size: Normal)   Ht 5\' 5"  (1.651 m)   Wt 196 lb (88.9 kg)   BMI 32.62 kg/m     General appearance: alert, cooperative and appears stated age    Pelvic: External genitalia:  no lesions              Urethra:  normal appearing urethra with no masses, tenderness or lesions              Bartholins and Skenes: normal                 Vagina: normal appearing vagina with normal color and discharge, no lesions              Cervix: absent                Bimanual Exam:  Uterus:  absent              Adnexa: no mass, fullness, tenderness     Pessary removed, cleansed and replaced with estradiol cream 1.5 grams.   Chaperone was present for exam:  Kimalexis  ASSESSMENT  Status post TVH/anterior colporrhaphy.  Pelvic organ relaxation.  Cystocele.  Midline. Pessary maintenance.  Doing well with Gelhorn pessary.  PLAN  Continue pessary care.  Continue vaginal estrogen use twice weekly.  FU for breast and pelvic exam and pessary care in 3 months.    An After Visit Summary was printed and given to the patient.  13  total time was spent for this patient encounter,  including preparation, face-to-face counseling with the patient, coordination of care, and documentation of the  encounter.

## 2022-01-13 ENCOUNTER — Ambulatory Visit
Admission: RE | Admit: 2022-01-13 | Discharge: 2022-01-13 | Disposition: A | Discharge status: Home / Self Care | Payer: Medicare Other | Source: Ambulatory Visit | Attending: Nurse Practitioner | Admitting: Nurse Practitioner

## 2022-01-13 DIAGNOSIS — Z1231 Encounter for screening mammogram for malignant neoplasm of breast: Secondary | ICD-10-CM

## 2022-01-15 ENCOUNTER — Encounter: Payer: Self-pay | Admitting: Obstetrics and Gynecology

## 2022-01-15 ENCOUNTER — Ambulatory Visit (INDEPENDENT_AMBULATORY_CARE_PROVIDER_SITE_OTHER): Payer: Medicare Other | Admitting: Obstetrics and Gynecology

## 2022-01-15 VITALS — BP 128/80 | Ht 65.0 in | Wt 196.0 lb

## 2022-01-15 DIAGNOSIS — Z4689 Encounter for fitting and adjustment of other specified devices: Secondary | ICD-10-CM | POA: Diagnosis not present

## 2022-01-15 DIAGNOSIS — N8111 Cystocele, midline: Secondary | ICD-10-CM

## 2022-01-23 ENCOUNTER — Other Ambulatory Visit: Payer: Self-pay | Admitting: Family Medicine

## 2022-01-23 DIAGNOSIS — N200 Calculus of kidney: Secondary | ICD-10-CM

## 2022-01-26 ENCOUNTER — Ambulatory Visit: Payer: Medicare Other

## 2022-01-31 DIAGNOSIS — B372 Candidiasis of skin and nail: Secondary | ICD-10-CM | POA: Insufficient documentation

## 2022-01-31 DIAGNOSIS — Z800 Family history of malignant neoplasm of digestive organs: Secondary | ICD-10-CM | POA: Insufficient documentation

## 2022-02-10 ENCOUNTER — Encounter: Payer: Self-pay | Admitting: Nurse Practitioner

## 2022-02-10 ENCOUNTER — Ambulatory Visit (INDEPENDENT_AMBULATORY_CARE_PROVIDER_SITE_OTHER): Payer: Medicare Other | Admitting: Nurse Practitioner

## 2022-02-10 VITALS — BP 131/81 | HR 66 | Temp 97.2°F | Resp 20 | Ht 65.0 in | Wt 199.0 lb

## 2022-02-10 DIAGNOSIS — I10 Essential (primary) hypertension: Secondary | ICD-10-CM | POA: Diagnosis not present

## 2022-02-10 DIAGNOSIS — K219 Gastro-esophageal reflux disease without esophagitis: Secondary | ICD-10-CM | POA: Diagnosis not present

## 2022-02-10 DIAGNOSIS — F411 Generalized anxiety disorder: Secondary | ICD-10-CM | POA: Diagnosis not present

## 2022-02-10 DIAGNOSIS — M1A9XX0 Chronic gout, unspecified, without tophus (tophi): Secondary | ICD-10-CM

## 2022-02-10 DIAGNOSIS — E669 Obesity, unspecified: Secondary | ICD-10-CM

## 2022-02-10 DIAGNOSIS — F5101 Primary insomnia: Secondary | ICD-10-CM

## 2022-02-10 DIAGNOSIS — E559 Vitamin D deficiency, unspecified: Secondary | ICD-10-CM

## 2022-02-10 DIAGNOSIS — E782 Mixed hyperlipidemia: Secondary | ICD-10-CM

## 2022-02-10 LAB — LIPID PANEL

## 2022-02-10 MED ORDER — HYDROCHLOROTHIAZIDE 50 MG PO TABS: 50.0000 mg | ORAL_TABLET | Freq: Every day | ORAL | 1 refills | Status: DC

## 2022-02-10 MED ORDER — LISINOPRIL 40 MG PO TABS: 40.0000 mg | ORAL_TABLET | Freq: Every day | ORAL | 1 refills | Status: DC

## 2022-02-10 MED ORDER — CLONAZEPAM 0.5 MG PO TABS: 0.5000 mg | ORAL_TABLET | Freq: Two times a day (BID) | ORAL | 5 refills | Status: DC | PRN

## 2022-02-10 MED ORDER — ESCITALOPRAM OXALATE 10 MG PO TABS: 10.0000 mg | ORAL_TABLET | Freq: Every day | ORAL | 1 refills | Status: DC

## 2022-02-10 MED ORDER — ATORVASTATIN CALCIUM 20 MG PO TABS: 20.0000 mg | ORAL_TABLET | Freq: Every day | ORAL | 1 refills | Status: DC

## 2022-02-10 NOTE — Patient Instructions (Signed)

## 2022-02-10 NOTE — Progress Notes (Signed)
Subjective:    Patient ID: Jane Howard, female    DOB: Jul 06, 1955, 67 y.o.   MRN: [id number]   Chief Complaint: medical management of chronic issues     HPI:  Jane Howard is a 67 y.o. who identifies as a female who was assigned female at birth.   Social history: Lives with: her grandson and om live with her Work history: retired   Scientist, forensic in today for follow up of the following chronic medical issues:  1. Essential hypertension, benign No c/o chest pain, sob or headache. Does not check blood pressure at home. BP Readings from Last 3 Encounters:  01/15/22 128/80  10/16/21 (!) 140/76  08/08/21 113/67     2. Mixed hyperlipidemia Does not watch diet and does little to no exercise. Lab Results  Component Value Date   CHOL 194 08/08/2021   HDL 50 08/08/2021   LDLCALC 102 (H) 08/08/2021   TRIG 250 (H) 08/08/2021   CHOLHDL 3.9 08/08/2021     3. Gastroesophageal reflux disease without esophagitis No prescription meds. Has been doing well.  4. GAD (generalized anxiety disorder) Is on klonopin BID and lexapro daily.     02/10/2022   10:35 AM 02/10/2022   10:10 AM 08/08/2021   10:38 AM 02/06/2021   11:28 AM  GAD 7 : Generalized Anxiety Score  Nervous, Anxious, on Edge 1 0 0 1  Control/stop worrying 0 0 0 0  Worry too much - different things 0 0 0 0  Trouble relaxing 0 0 0 1  Restless 1 0 0 0  Easily annoyed or irritable 0 0 0 0  Afraid - awful might happen 0 0 0 0  Total GAD 7 Score 2 0 0 2  Anxiety Difficulty Not difficult at all Not difficult at all Not difficult at all Not difficult at all      5. Primary insomnia Takes second dose of klonopin late evening which also helps her sleep.  6. Vitamin D deficiency Daily vitamin d supplement  7. Chronic gout without tophus, unspecified cause, unspecified site No recent flare ups  8. Obesity (BMI 30-39.9) Weight is up 3 lbs Wt Readings from Last 3 Encounters:  02/10/22 199 lb (90.3 kg)  01/15/22 196 lb  (88.9 kg)  10/16/21 196 lb (88.9 kg)   BMI Readings from Last 3 Encounters:  02/10/22 33.12 kg/m  01/15/22 32.62 kg/m  10/16/21 32.12 kg/m      New complaints: None today  Allergies  Allergen Reactions   Amoxicillin Other (See Comments)    Causes yeast infection   Macrodantin [Nitrofurantoin Macrocrystal]    Vicodin [Hydrocodone-Acetaminophen]    Outpatient Encounter Medications as of 02/10/2022  Medication Sig   acetaminophen-codeine (TYLENOL #3) 300-30 MG tablet Take 1 tablet by mouth every 6 (six) hours as needed for moderate pain.   allopurinol (ZYLOPRIM) 100 MG tablet Take 1 tablet (100 mg total) by mouth daily.   aspirin EC 81 MG tablet Take 81 mg by mouth daily.   atorvastatin (LIPITOR) 20 MG tablet Take 1 tablet (20 mg total) by mouth daily.   Calcium Carb-Cholecalciferol (CALCIUM 500+D) 500-5 MG-MCG TABS Take 1 tablet by mouth daily.   Cholecalciferol (VITAMIN D3) 125 MCG (5000 UT) CAPS Take 1 capsule by mouth daily.   clonazePAM (KLONOPIN) 0.5 MG tablet Take 1 tablet (0.5 mg total) by mouth 2 (two) times daily as needed for anxiety.   escitalopram (LEXAPRO) 10 MG tablet Take 1 tablet by mouth daily.  estradiol (ESTRACE) 0.1 MG/GM vaginal cream Use 1/2 g vaginally two or three times per week as needed to maintain symptom relief.   fluticasone (FLONASE) 50 MCG/ACT nasal spray PLACE 1 SPRAY INTO BOTH NOSTRILS 2 (TWO) TIMES DAILY AS NEEDED FOR ALLERGIES OR RHINITIS.   hydrochlorothiazide (HYDRODIURIL) 50 MG tablet    lisinopril (ZESTRIL) 40 MG tablet Take 1 tablet (40 mg total) by mouth daily.   Multiple Vitamin (MULTI-DAY PO) Take 1 tablet by mouth daily. L-Lysine   nystatin ointment (MYCOSTATIN) APPLY TO THE AFFECTED AREA(S) BY TOPICAL ROUTE 2 TIMES PER DAY   Probiotic Product (PROBIOTIC PO)    tamsulosin (FLOMAX) 0.4 MG CAPS capsule TAKE 1 CAPSULE (0.4 MG TOTAL) BY MOUTH DAILY AS NEEDED.   triamcinolone ointment (KENALOG) 0.1 % APPLY A THIN LAYER TO THE AFFECTED  AREA(S) BY TOPICAL ROUTE 2 TIMES PER DAY   No facility-administered encounter medications on file as of 02/10/2022.    Past Surgical History:  Procedure Laterality Date   ABDOMINAL HYSTERECTOMY     FOOT SURGERY Bilateral    NASAL SEPTUM SURGERY     TONSILLECTOMY      Family History  Problem Relation Age of Onset   Heart disease Mother    Kidney disease Mother    Cancer Father        prostate   Heart disease Father    Kidney disease Father       Controlled substance contract: n/a     Review of Systems  Constitutional:  Negative for diaphoresis.  Eyes:  Negative for pain.  Respiratory:  Negative for shortness of breath.   Cardiovascular:  Negative for chest pain, palpitations and leg swelling.  Gastrointestinal:  Negative for abdominal pain.  Endocrine: Negative for polydipsia.  Skin:  Negative for rash.  Neurological:  Negative for dizziness, weakness and headaches.  Hematological:  Does not bruise/bleed easily.  All other systems reviewed and are negative.      Objective:   Physical Exam Vitals reviewed.  Constitutional:      General: She is not in acute distress.    Appearance: Normal appearance. She is well-developed.  HENT:     Head: Normocephalic.     Right Ear: Tympanic membrane normal.     Left Ear: Tympanic membrane normal.     Nose: Nose normal.     Mouth/Throat:     Mouth: Mucous membranes are moist.  Eyes:     Pupils: Pupils are equal, round, and reactive to light.  Neck:     Vascular: No carotid bruit or JVD.  Cardiovascular:     Rate and Rhythm: Normal rate and regular rhythm.     Heart sounds: Normal heart sounds.  Pulmonary:     Effort: Pulmonary effort is normal. No respiratory distress.     Breath sounds: Normal breath sounds. No wheezing or rales.  Chest:     Chest wall: No tenderness.  Abdominal:     General: Bowel sounds are normal. There is no distension or abdominal bruit.     Palpations: Abdomen is soft. There is no  hepatomegaly, splenomegaly, mass or pulsatile mass.     Tenderness: There is no abdominal tenderness.  Musculoskeletal:        General: Normal range of motion.     Cervical back: Normal range of motion and neck supple.  Lymphadenopathy:     Cervical: No cervical adenopathy.  Skin:    General: Skin is warm and dry.  Neurological:  Mental Status: She is alert and oriented to person, place, and time.     Deep Tendon Reflexes: Reflexes are normal and symmetric.  Psychiatric:        Behavior: Behavior normal.        Thought Content: Thought content normal.        Judgment: Judgment normal.     BP 131/81   Pulse 66   Temp (!) 97.2 F (36.2 C) (Temporal)   Resp 20   Ht _0  (1.651 m)   Wt 199 lb (90.3 kg)   SpO2 98%   BMI 33.12 kg/m        Assessment & Plan:   Jane Howard comes in today with chief complaint of Medical Management of Chronic Issues (/)   Diagnosis and orders addressed:  1. Essential hypertension, benign Low sodium diet - CBC with Differential/Platelet - CMP14+EGFR - lisinopril (ZESTRIL) 40 MG tablet; Take 1 tablet (40 mg total) by mouth daily.  Dispense: 90 tablet; Refill: 1 - hydrochlorothiazide (HYDRODIURIL) 50 MG tablet; Take 1 tablet (50 mg total) by mouth daily.  Dispense: 90 tablet; Refill: 1  2. Mixed hyperlipidemia Low fat diet - Lipid panel - atorvastatin (LIPITOR) 20 MG tablet; Take 1 tablet (20 mg total) by mouth daily.  Dispense: 90 tablet; Refill: 1  3. Gastroesophageal reflux disease without esophagitis Avoid spicy foods Do not eat 2 hours prior to bedtime  4. GAD (generalized anxiety disorder) Stress management - ToxASSURE Select 13 (MW), Urine - clonazePAM (KLONOPIN) 0.5 MG tablet; Take 1 tablet (0.5 mg total) by mouth 2 (two) times daily as needed for anxiety.  Dispense: 60 tablet; Refill: 5 - escitalopram (LEXAPRO) 10 MG tablet; Take 1 tablet (10 mg total) by mouth daily.  Dispense: 90 tablet; Refill: 1  5. Primary  insomnia Bedtime routine  6. Vitamin D deficiency Continue vitamin d supplement  7. Chronic gout without tophus, unspecified cause, unspecified site Low purine diet  8. Obesity (BMI 30-39.9)  Discussed diet and exercise for person with BMI >25 Will recheck weight in 3-6 months    Labs pending Health Maintenance reviewed Diet and exercise encouraged  Follow up plan: 6 months   Mary-Margaret Hassell Done, FNP

## 2022-02-11 LAB — CBC WITH DIFFERENTIAL/PLATELET
Basophils Absolute: 0.1 10*3/uL (ref 0.0–0.2)
Basos: 1 %
EOS (ABSOLUTE): 0.1 10*3/uL (ref 0.0–0.4)
Eos: 2 %
Hematocrit: 41.4 % (ref 34.0–46.6)
Hemoglobin: 14 g/dL (ref 11.1–15.9)
Immature Grans (Abs): 0 10*3/uL (ref 0.0–0.1)
Immature Granulocytes: 0 %
Lymphocytes Absolute: 2.3 10*3/uL (ref 0.7–3.1)
Lymphs: 35 %
MCH: 29.8 pg (ref 26.6–33.0)
MCHC: 33.8 g/dL (ref 31.5–35.7)
MCV: 88 fL (ref 79–97)
Monocytes Absolute: 0.3 10*3/uL (ref 0.1–0.9)
Monocytes: 5 %
Neutrophils Absolute: 3.7 10*3/uL (ref 1.4–7.0)
Neutrophils: 57 %
Platelets: 289 10*3/uL (ref 150–450)
RBC: 4.7 x10E6/uL (ref 3.77–5.28)
RDW: 12.6 % (ref 11.7–15.4)
WBC: 6.5 10*3/uL (ref 3.4–10.8)

## 2022-02-11 LAB — CMP14+EGFR
ALT: 24 IU/L (ref 0–32)
AST: 17 IU/L (ref 0–40)
Albumin/Globulin Ratio: 1.8 (ref 1.2–2.2)
Albumin: 4.2 g/dL (ref 3.9–4.9)
Alkaline Phosphatase: 80 IU/L (ref 44–121)
BUN/Creatinine Ratio: 17 (ref 12–28)
BUN: 13 mg/dL (ref 8–27)
Bilirubin Total: 0.4 mg/dL (ref 0.0–1.2)
CO2: 26 mmol/L (ref 20–29)
Calcium: 9.5 mg/dL (ref 8.7–10.3)
Chloride: 102 mmol/L (ref 96–106)
Creatinine, Ser: 0.75 mg/dL (ref 0.57–1.00)
Globulin, Total: 2.4 g/dL (ref 1.5–4.5)
Glucose: 102 mg/dL — ABNORMAL HIGH (ref 70–99)
Potassium: 4.8 mmol/L (ref 3.5–5.2)
Sodium: 139 mmol/L (ref 134–144)
Total Protein: 6.6 g/dL (ref 6.0–8.5)
eGFR: 88 mL/min/{1.73_m2} (ref >59)

## 2022-02-11 LAB — LIPID PANEL
Chol/HDL Ratio: 3.9 ratio (ref 0.0–4.4)
Cholesterol, Total: 201 mg/dL — ABNORMAL HIGH (ref 100–199)
HDL: 51 mg/dL (ref >39)
LDL Chol Calc (NIH): 112 mg/dL — ABNORMAL HIGH (ref 0–99)
Triglycerides: 218 mg/dL — ABNORMAL HIGH (ref 0–149)
VLDL Cholesterol Cal: 38 mg/dL (ref 5–40)

## 2022-02-12 LAB — TOXASSURE SELECT 13 (MW), URINE

## 2022-03-20 ENCOUNTER — Telehealth: Payer: Self-pay | Admitting: Nurse Practitioner

## 2022-03-20 NOTE — Telephone Encounter (Signed)
Patient declined the Medicare Wellness Visit with [facility]  Spoke with patient and she stated she would did not want to schedule at this time and that she would call back if she wanted to schedule.

## 2022-04-03 NOTE — Progress Notes (Signed)
GYNECOLOGY  VISIT   HPI: 67 y.o.   Married  Caucasian  female   G2P2 with No LMP recorded. Patient has had a hysterectomy.   here for   3 mo pessary follow up and breast exam.   She had a bladder infection after her last pessary change. She was seen urgent care.  Treated with 2 abx.   Wants to continue the pessary use.   She uses vaginal estrogen cream.   No other concerns.   GYNECOLOGIC HISTORY: No LMP recorded. Patient has had a hysterectomy. Contraception:  hysterectomy Menopausal hormone therapy:  estrace  Last mammogram:  01/13/22 - BI-RADS1, cat C density, 06/29/20, Breast Density Category B, BI-RADS CATEGORY 1 Negative  Last pap smear:   2016 at [place]        OB History     Gravida  2   Para  2   Term      Preterm      AB      Living         SAB      IAB      Ectopic      Multiple      Live Births                 Patient Active Problem List   Diagnosis Date Noted   Constipation 04/16/2022   Candidiasis of skin 01/31/2022   Family history of malignant neoplasm of digestive organs 01/31/2022   Primary insomnia 02/06/2021   Pain of both hip joints 05/01/2020   Gastroesophageal reflux disease without esophagitis 02/06/2020   Vitamin D deficiency 12/01/2016   Hyperlipidemia 11/27/2016   Obesity (BMI 30-39.9) 11/06/2015   Gout 11/17/2013   Essential hypertension, benign 11/17/2013   GAD (generalized anxiety disorder) 11/17/2013    Past Medical History:  Diagnosis Date   Anxiety    Nephrolithiasis     Past Surgical History:  Procedure Laterality Date   ABDOMINAL HYSTERECTOMY     FOOT SURGERY Bilateral    NASAL SEPTUM SURGERY     TONSILLECTOMY      Current Outpatient Medications  Medication Sig Dispense Refill   allopurinol (ZYLOPRIM) 100 MG tablet TAKE 1 TABLET BY MOUTH EVERY DAY 90 tablet 1   aspirin EC 81 MG tablet Take 81 mg by mouth daily.     atorvastatin (LIPITOR) 20 MG tablet Take 1 tablet (20 mg total) by  mouth daily. 90 tablet 1   Calcium Carb-Cholecalciferol (CALCIUM 500+D) 500-5 MG-MCG TABS Take 1 tablet by mouth daily.     Cholecalciferol (VITAMIN D3) 125 MCG (5000 UT) CAPS Take 1 capsule by mouth daily.     clonazePAM (KLONOPIN) 0.5 MG tablet Take 1 tablet (0.5 mg total) by mouth 2 (two) times daily as needed for anxiety. 60 tablet 5   escitalopram (LEXAPRO) 10 MG tablet Take 1 tablet (10 mg total) by mouth daily. 90 tablet 1   estradiol (ESTRACE) 0.1 MG/GM vaginal cream Use 1/2 g vaginally two or three times per week as needed to maintain symptom relief. 42.5 g 1   fluticasone (FLONASE) 50 MCG/ACT nasal spray PLACE 1 SPRAY INTO BOTH NOSTRILS 2 (TWO) TIMES DAILY AS NEEDED FOR ALLERGIES OR RHINITIS. 48 mL 1   hydrochlorothiazide (HYDRODIURIL) 50 MG tablet Take 1 tablet (50 mg total) by mouth daily. 90 tablet 1   lisinopril (ZESTRIL) 40 MG tablet Take 1 tablet (40 mg total) by mouth daily. 90 tablet 1   Multiple Vitamin (MULTI-DAY  PO) Take 1 tablet by mouth daily. L-Lysine     nystatin ointment (MYCOSTATIN) APPLY TO THE AFFECTED AREA(S) BY TOPICAL ROUTE 2 TIMES PER DAY     Probiotic Product (PROBIOTIC PO)      tamsulosin (FLOMAX) 0.4 MG CAPS capsule TAKE 1 CAPSULE (0.4 MG TOTAL) BY MOUTH DAILY AS NEEDED. 90 capsule 0   triamcinolone ointment (KENALOG) 0.1 % APPLY A THIN LAYER TO THE AFFECTED AREA(S) BY TOPICAL ROUTE 2 TIMES PER DAY     No current facility-administered medications for this visit.     ALLERGIES: Amoxicillin, Macrodantin [nitrofurantoin macrocrystal], and Vicodin [hydrocodone-acetaminophen]  Family History  Problem Relation Age of Onset   Heart disease Mother    Kidney disease Mother    Cancer Father        prostate   Heart disease Father    Kidney disease Father     Social History   Socioeconomic History   Marital status: Married    Spouse name: Not on file   Number of children: Not on file   Years of education: Not on file   Highest education level: Not on file   Occupational History   Not on file  Tobacco Use   Smoking status: Never   Smokeless tobacco: Never  Vaping Use   Vaping Use: Never used  Substance and Sexual Activity   Alcohol use: Yes    Comment: 1 drink/month   Drug use: No   Sexual activity: Not Currently    Birth control/protection: Surgical    Comment: hysterectomy  Other Topics Concern   Not on file  Social History Narrative   Not on file   Social Determinants of Health   Financial Resource Strain: Not on file  Food Insecurity: Not on file  Transportation Needs: Not on file  Physical Activity: Not on file  Stress: Not on file  Social Connections: Not on file  Intimate Partner Violence: Not on file    Review of Systems  All other systems reviewed and are negative.   PHYSICAL EXAMINATION:    BP 112/74 (BP Location: Left Arm, Patient Position: Sitting, Cuff Size: Large)   Pulse 89   Ht '5\' 5"'$  (1.651 m)   Wt 202 lb (91.6 kg)   SpO2 96%   BMI 33.61 kg/m     General appearance: alert, cooperative and appears stated age Head: Normocephalic, without obvious abnormality, atraumatic Neck: no adenopathy, supple, symmetrical, trachea midline and thyroid normal to inspection and palpation Lungs: clear to auscultation bilaterally Breasts: normal appearance, no masses or tenderness, No nipple retraction or dimpling, No nipple discharge or bleeding, No axillary or supraclavicular adenopathy Heart: regular rate and rhythm Abdomen: soft, non-tender, no masses,  no organomegaly Extremities: extremities normal, atraumatic, no cyanosis or edema Skin: Skin color, texture, turgor normal. No rashes or lesions Lymph nodes: Cervical, supraclavicular, and axillary nodes normal. No abnormal inguinal nodes palpated Neurologic: Grossly normal  Pelvic: External genitalia:  no lesions              Urethra:  normal appearing urethra with no masses, tenderness or lesions              Bartholins and Skenes: normal                  Vagina: normal appearing vagina with normal color and discharge, no lesions              Cervix: absent.  Mildline cystocele noted.  Bimanual Exam:  Uterus:  absent              Adnexa: no mass, fullness, tenderness           Gelhorn pessary removed, cleansed and replaced with Estrace cream 1 gram.   Chaperone was present for exam:  Emily  ASSESSMENT  Cystocele.  Status post TVH/anterior colporrhaphy.   Pessary maintenance.  Encounter for medication monitoring.   PLAN  Continue pessary use.  Refills for vaginal estrogen cream.  I discussed potential effect on breast cancer.  I recommend use of pea size amount of estrogen to the urethra at bedtime when placing the estrogen in the vagina.  We reviewed this as a method for reducing risk of postmenopausal urinary tract infection.  FU for next pessary check in 3 months.    An After Visit Summary was printed and given to the patient.  20 min  total time was spent for this patient encounter, including preparation, face-to-face counseling with the patient, coordination of care, and documentation of the encounter.

## 2022-04-04 ENCOUNTER — Other Ambulatory Visit: Payer: Self-pay | Admitting: Nurse Practitioner

## 2022-04-04 DIAGNOSIS — M1A9XX0 Chronic gout, unspecified, without tophus (tophi): Secondary | ICD-10-CM

## 2022-04-14 ENCOUNTER — Other Ambulatory Visit: Payer: Self-pay | Admitting: Nurse Practitioner

## 2022-04-14 DIAGNOSIS — F411 Generalized anxiety disorder: Secondary | ICD-10-CM

## 2022-04-16 ENCOUNTER — Encounter: Payer: Self-pay | Admitting: Obstetrics and Gynecology

## 2022-04-16 ENCOUNTER — Ambulatory Visit (INDEPENDENT_AMBULATORY_CARE_PROVIDER_SITE_OTHER): Payer: Medicare Other | Admitting: Obstetrics and Gynecology

## 2022-04-16 VITALS — BP 112/74 | HR 89 | Ht 65.0 in | Wt 202.0 lb

## 2022-04-16 DIAGNOSIS — N8111 Cystocele, midline: Secondary | ICD-10-CM

## 2022-04-16 DIAGNOSIS — K5900 Constipation, unspecified: Secondary | ICD-10-CM | POA: Insufficient documentation

## 2022-04-16 DIAGNOSIS — Z5181 Encounter for therapeutic drug level monitoring: Secondary | ICD-10-CM

## 2022-04-16 DIAGNOSIS — Z4689 Encounter for fitting and adjustment of other specified devices: Secondary | ICD-10-CM | POA: Diagnosis not present

## 2022-04-16 MED ORDER — ESTRADIOL 0.1 MG/GM VA CREA: TOPICAL_CREAM | VAGINAL | 1 refills | Status: DC

## 2022-04-19 ENCOUNTER — Other Ambulatory Visit: Payer: Self-pay | Admitting: Family Medicine

## 2022-04-19 DIAGNOSIS — N200 Calculus of kidney: Secondary | ICD-10-CM

## 2022-07-02 NOTE — Progress Notes (Signed)
GYNECOLOGY  VISIT   HPI: 67 y.o.   Married  Caucasian  female   G2P2 with No LMP recorded. Patient has had a hysterectomy.   here for   3 mo pessary  No bleeding, pain, or unusual discharge.  Using vaginal estrogen 2 times a week and is placing at the urethral opening.  No refills needed per patient.   Wants to continue pessary use.   GYNECOLOGIC HISTORY: No LMP recorded. Patient has had a hysterectomy. Contraception:  hyst Menopausal hormone therapy:  estrace Last mammogram:  01/13/22 - BI-RADS1, cat C density, 06/29/20, Breast Density Category B, BI-RADS CATEGORY 1 Negative  Last pap smear:   2016 at [facility]        OB History     Gravida  2   Para  2   Term      Preterm      AB      Living         SAB      IAB      Ectopic      Multiple      Live Births                 Patient Active Problem List   Diagnosis Date Noted   Constipation 04/16/2022   Candidiasis of skin 01/31/2022   Family history of malignant neoplasm of digestive organs 01/31/2022   Primary insomnia 02/06/2021   Pain of both hip joints 05/01/2020   Gastroesophageal reflux disease without esophagitis 02/06/2020   Vitamin D deficiency 12/01/2016   Hyperlipidemia 11/27/2016   Obesity (BMI 30-39.9) 11/06/2015   Gout 11/17/2013   Essential hypertension, benign 11/17/2013   GAD (generalized anxiety disorder) 11/17/2013    Past Medical History:  Diagnosis Date   Anxiety    Nephrolithiasis     Past Surgical History:  Procedure Laterality Date   ABDOMINAL HYSTERECTOMY     FOOT SURGERY Bilateral    NASAL SEPTUM SURGERY     TONSILLECTOMY      Current Outpatient Medications  Medication Sig Dispense Refill   allopurinol (ZYLOPRIM) 100 MG tablet TAKE 1 TABLET BY MOUTH EVERY DAY 90 tablet 1   aspirin EC 81 MG tablet Take 81 mg by mouth daily.     atorvastatin (LIPITOR) 20 MG tablet Take 1 tablet (20 mg total) by mouth daily. 90 tablet 1   Calcium Carb-Cholecalciferol  (CALCIUM 500+D) 500-5 MG-MCG TABS Take 1 tablet by mouth daily.     Cholecalciferol (VITAMIN D3) 125 MCG (5000 UT) CAPS Take 1 capsule by mouth daily.     clonazePAM (KLONOPIN) 0.5 MG tablet Take 1 tablet (0.5 mg total) by mouth 2 (two) times daily as needed for anxiety. 60 tablet 5   escitalopram (LEXAPRO) 10 MG tablet Take 1 tablet (10 mg total) by mouth daily. 90 tablet 1   estradiol (ESTRACE) 0.1 MG/GM vaginal cream Use 1/2 g vaginally two or three times per week as needed to maintain symptom relief. 42.5 g 1   fluticasone (FLONASE) 50 MCG/ACT nasal spray PLACE 1 SPRAY INTO BOTH NOSTRILS 2 (TWO) TIMES DAILY AS NEEDED FOR ALLERGIES OR RHINITIS. 48 mL 1   hydrochlorothiazide (HYDRODIURIL) 50 MG tablet Take 1 tablet (50 mg total) by mouth daily. 90 tablet 1   lisinopril (ZESTRIL) 40 MG tablet Take 1 tablet (40 mg total) by mouth daily. 90 tablet 1   Multiple Vitamin (MULTI-DAY PO) Take 1 tablet by mouth daily. L-Lysine     nystatin  ointment (MYCOSTATIN) APPLY TO THE AFFECTED AREA(S) BY TOPICAL ROUTE 2 TIMES PER DAY     Probiotic Product (PROBIOTIC PO)      triamcinolone ointment (KENALOG) 0.1 % APPLY A THIN LAYER TO THE AFFECTED AREA(S) BY TOPICAL ROUTE 2 TIMES PER DAY     tamsulosin (FLOMAX) 0.4 MG CAPS capsule TAKE 1 CAPSULE (0.4 MG TOTAL) BY MOUTH DAILY AS NEEDED. (Patient not taking: Reported on 07/16/2022) 90 capsule 0   No current facility-administered medications for this visit.     ALLERGIES: Amoxicillin, Macrodantin [nitrofurantoin macrocrystal], and Vicodin [hydrocodone-acetaminophen]  Family History  Problem Relation Age of Onset   Heart disease Mother    Kidney disease Mother    Cancer Father        prostate   Heart disease Father    Kidney disease Father     Social History   Socioeconomic History   Marital status: Married    Spouse name: Not on file   Number of children: Not on file   Years of education: Not on file   Highest education level: Not on file  Occupational  History   Not on file  Tobacco Use   Smoking status: Never   Smokeless tobacco: Never  Vaping Use   Vaping Use: Never used  Substance and Sexual Activity   Alcohol use: Yes    Comment: 1 drink/month   Drug use: No   Sexual activity: Not Currently    Birth control/protection: Surgical    Comment: hysterectomy  Other Topics Concern   Not on file  Social History Narrative   Not on file   Social Determinants of Health   Financial Resource Strain: Not on file  Food Insecurity: Not on file  Transportation Needs: Not on file  Physical Activity: Not on file  Stress: Not on file  Social Connections: Not on file  Intimate Partner Violence: Not on file    Review of Systems  All other systems reviewed and are negative.   PHYSICAL EXAMINATION:    BP 130/84 (BP Location: Right Arm, Patient Position: Sitting, Cuff Size: Normal)   Pulse 60   Ht 5\' 5"  (1.651 m)   Wt 200 lb (90.7 kg)   SpO2 98%   BMI 33.28 kg/m     General appearance: alert, cooperative and appears stated age  Pelvic: External genitalia:  no lesions              Urethra:  normal appearing urethra with no masses, tenderness or lesions              Bartholins and Skenes: normal                 Vagina: normal appearing vagina with normal color and discharge, no lesions              Cervix: absent                Bimanual Exam:  Uterus:  absent              Adnexa: no mass, fullness, tenderness     Gelhorn pessary removed, cleansed, and replaced.  Chaperone was present for exam:  Warren Lacy, CMA  ASSESSMENT  Midline cystocele. Pessary maintenance.  Doing well.   PLAN  Continue pessary use.  Continue vaginal estrogen 1/2 gram pv 2 - 3 times per week.  FU for pessary check in 3 months.   14 min  total time was spent for this patient encounter, including preparation,  face-to-face counseling with the patient, coordination of care, and documentation of the encounter.

## 2022-07-16 ENCOUNTER — Encounter: Payer: Self-pay | Admitting: Obstetrics and Gynecology

## 2022-07-16 ENCOUNTER — Ambulatory Visit (INDEPENDENT_AMBULATORY_CARE_PROVIDER_SITE_OTHER): Payer: Medicare Other | Admitting: Obstetrics and Gynecology

## 2022-07-16 VITALS — BP 130/84 | HR 60 | Ht 65.0 in | Wt 200.0 lb

## 2022-07-16 DIAGNOSIS — N8111 Cystocele, midline: Secondary | ICD-10-CM

## 2022-07-16 DIAGNOSIS — Z4689 Encounter for fitting and adjustment of other specified devices: Secondary | ICD-10-CM | POA: Diagnosis not present

## 2022-08-11 ENCOUNTER — Ambulatory Visit (INDEPENDENT_AMBULATORY_CARE_PROVIDER_SITE_OTHER): Payer: Medicare Other | Admitting: Nurse Practitioner

## 2022-08-11 ENCOUNTER — Other Ambulatory Visit: Payer: Self-pay | Admitting: Nurse Practitioner

## 2022-08-11 ENCOUNTER — Encounter: Payer: Self-pay | Admitting: Nurse Practitioner

## 2022-08-11 VITALS — BP 133/71 | HR 64 | Temp 97.4°F | Resp 20 | Ht 65.0 in | Wt 200.0 lb

## 2022-08-11 DIAGNOSIS — I10 Essential (primary) hypertension: Secondary | ICD-10-CM | POA: Diagnosis not present

## 2022-08-11 DIAGNOSIS — E669 Obesity, unspecified: Secondary | ICD-10-CM

## 2022-08-11 DIAGNOSIS — E782 Mixed hyperlipidemia: Secondary | ICD-10-CM

## 2022-08-11 DIAGNOSIS — K219 Gastro-esophageal reflux disease without esophagitis: Secondary | ICD-10-CM

## 2022-08-11 DIAGNOSIS — F5101 Primary insomnia: Secondary | ICD-10-CM

## 2022-08-11 DIAGNOSIS — K5901 Slow transit constipation: Secondary | ICD-10-CM

## 2022-08-11 DIAGNOSIS — F411 Generalized anxiety disorder: Secondary | ICD-10-CM

## 2022-08-11 DIAGNOSIS — Z6833 Body mass index (BMI) 33.0-33.9, adult: Secondary | ICD-10-CM

## 2022-08-11 DIAGNOSIS — E559 Vitamin D deficiency, unspecified: Secondary | ICD-10-CM

## 2022-08-11 DIAGNOSIS — M1A9XX0 Chronic gout, unspecified, without tophus (tophi): Secondary | ICD-10-CM

## 2022-08-11 MED ORDER — ATORVASTATIN CALCIUM 20 MG PO TABS
20.0000 mg | ORAL_TABLET | Freq: Every day | ORAL | 1 refills | Status: DC
Start: 2022-08-11 — End: 2023-02-16

## 2022-08-11 MED ORDER — HYDROCHLOROTHIAZIDE 50 MG PO TABS
50.0000 mg | ORAL_TABLET | Freq: Every day | ORAL | 1 refills | Status: DC
Start: 2022-08-11 — End: 2023-02-16

## 2022-08-11 MED ORDER — LISINOPRIL 40 MG PO TABS: 40.0000 mg | ORAL_TABLET | Freq: Every day | ORAL | 1 refills | Status: DC

## 2022-08-11 MED ORDER — ALLOPURINOL 100 MG PO TABS: 100.0000 mg | ORAL_TABLET | Freq: Every day | ORAL | 1 refills | Status: DC

## 2022-08-11 MED ORDER — ESCITALOPRAM OXALATE 10 MG PO TABS
10.0000 mg | ORAL_TABLET | Freq: Every day | ORAL | 1 refills | Status: DC
Start: 2022-08-11 — End: 2023-02-16

## 2022-08-11 MED ORDER — CLONAZEPAM 0.5 MG PO TABS
0.5000 mg | ORAL_TABLET | Freq: Two times a day (BID) | ORAL | 5 refills | Status: DC | PRN
Start: 2022-08-11 — End: 2023-02-16

## 2022-08-11 NOTE — Patient Instructions (Signed)
Gout  Gout is a condition that causes painful swelling of the joints. Gout is a type of inflammation of the joints (arthritis). This condition is caused by having too much uric acid in the body. Uric acid is a chemical that forms when the body breaks down substances called purines. Purines are important for building body proteins. When the body has too much uric acid, sharp crystals can form and build up inside the joints. This causes pain and swelling. Gout attacks can happen quickly and may be very painful (acute gout). Over time, the attacks can affect more joints and become more frequent (chronic gout). Gout can also cause uric acid to build up under the skin and inside the kidneys. What are the causes? This condition is caused by too much uric acid in your blood. This can happen because: Your kidneys do not remove enough uric acid from your blood. This is the most common cause. Your body makes too much uric acid. This can happen with some cancers and cancer treatments. It can also occur if your body is breaking down too many red blood cells (hemolytic anemia). You eat too many foods that are high in purines. These foods include organ meats and some seafood. Alcohol, especially beer, is also high in purines. A gout attack may be triggered by trauma or stress. What increases the risk? The following factors may make you more likely to develop this condition: Having a family history of gout. Being female and middle-aged. Being female and having gone through menopause. Taking certain medicines, including aspirin, cyclosporine, diuretics, levodopa, and niacin. Having an organ transplant. Having certain conditions, such as: Being obese. Lead poisoning. Kidney disease. A skin condition called psoriasis. Other factors include: Losing weight too quickly. Being dehydrated. Frequently drinking alcohol, especially beer. Frequently drinking beverages that are sweetened with a type of sugar called  fructose. What are the signs or symptoms? An attack of acute gout happens quickly. It usually occurs in just one joint. The most common place is the big toe. Attacks often start at night. Other joints that may be affected include joints of the feet, ankle, knee, fingers, wrist, or elbow. Symptoms of this condition may include: Severe pain. Warmth. Swelling. Stiffness. Tenderness. The affected joint may be very painful to touch. Shiny, red, or purple skin. Chills and fever. Chronic gout may cause symptoms more frequently. More joints may be involved. You may also have white or yellow lumps (tophi) on your hands or feet or in other areas near your joints. How is this diagnosed? This condition is diagnosed based on your symptoms, your medical history, and a physical exam. You may have tests, such as: Blood tests to measure uric acid levels. Removal of joint fluid with a thin needle (aspiration) to look for uric acid crystals. X-rays to look for joint damage. How is this treated? Treatment for this condition has two phases: treating an acute attack and preventing future attacks. Acute gout treatment may include medicines to reduce pain and swelling, including: NSAIDs, such as ibuprofen. Steroids. These are strong anti-inflammatory medicines that can be taken by mouth (orally) or injected into a joint. Colchicine. This medicine relieves pain and swelling when it is taken soon after an attack. It can be given by mouth or through an IV. Preventive treatment may include: Daily use of smaller doses of NSAIDs or colchicine. Use of a medicine that reduces uric acid levels in your blood, such as allopurinol. Changes to your diet. You may need to see   a dietitian about what to eat and drink to prevent gout. Follow these instructions at home: During a gout attack  If directed, put ice on the affected area. To do this: Put ice in a plastic bag. Place a towel between your skin and the bag. Leave the  ice on for 20 minutes, 2-3 times a day. Remove the ice if your skin turns bright red. This is very important. If you cannot feel pain, heat, or cold, you have a greater risk of damage to the area. Raise (elevate) the affected joint above the level of your heart as often as possible. Rest the joint as much as possible. If the affected joint is in your leg, you may be given crutches to use. Follow instructions from your health care provider about eating or drinking restrictions. Avoiding future gout attacks Follow a low-purine diet as told by your dietitian or health care provider. Avoid foods and drinks that are high in purines, including liver, kidney, anchovies, asparagus, herring, mushrooms, mussels, and beer. Maintain a healthy weight or lose weight if you are overweight. If you want to lose weight, talk with your health care provider. Do not lose weight too quickly. Start or maintain an exercise program as told by your health care provider. Eating and drinking Avoid drinking beverages that contain fructose. Drink enough fluids to keep your urine pale yellow. If you drink alcohol: Limit how much you have to: 0-1 drink a day for women who are not pregnant. 0-2 drinks a day for men. Know how much alcohol is in a drink. In the [place] one drink equals one 12 oz bottle of beer (355 mL), one 5 oz glass of wine (148 mL), or one 1 oz glass of hard liquor (44 mL). General instructions Take over-the-counter and prescription medicines only as told by your health care provider. Ask your health care provider if the medicine prescribed to you requires you to avoid driving or using machinery. Return to your normal activities as told by your health care provider. Ask your health care provider what activities are safe for you. Keep all follow-up visits. This is important. Where to find more information National Institutes of Health: www.niams.nih.gov Contact a health care provider if you have: Another  gout attack. Continuing symptoms of a gout attack after 10 days of treatment. Side effects from your medicines. Chills or a fever. Burning pain when you urinate. Pain in your lower back or abdomen. Get help right away if you: Have severe or uncontrolled pain. Cannot urinate. Summary Gout is painful swelling of the joints caused by having too much uric acid in the body. The most common site for gout to occur is in the big toe, but it can affect other joints in the body. Medicines and dietary changes can help to prevent and treat gout attacks. This information is not intended to replace advice given to you by your health care provider. Make sure you discuss any questions you have with your health care provider. Document Revised: 10/30/2020 Document Reviewed: 10/30/2020 Elsevier Patient Education  2024 Elsevier Inc.  

## 2022-08-11 NOTE — Progress Notes (Signed)
Subjective:    Patient ID: Jane Howard, female    DOB: 08/11/1955, 67 y.o.   MRN: [id number]   Chief Complaint: Medical Management of Chronic Issues    HPI:  Jane Howard is a 67 y.o. who identifies as a female who was assigned female at birth.   Social history: Lives with: her grandson still lives with her Work history: retired   Water engineer in today for follow up of the following chronic medical issues:  1. Essential hypertension, benign No c/o chest pain, sob or headache. Doe snot check blood pressure at home BP Readings from Last 3 Encounters:  08/11/22 133/71  07/16/22 130/84  04/16/22 112/74     2. Mixed hyperlipidemia Doe snot watch diet very closely. No exercise. Lab Results  Component Value Date   CHOL 201 (H) 02/10/2022   HDL 51 02/10/2022   LDLCALC 112 (H) 02/10/2022   TRIG 218 (H) 02/10/2022   CHOLHDL 3.9 02/10/2022   The 10-year ASCVD risk score (Arnett DK, et al., 2019) is: 9.2%   3. Gastroesophageal reflux disease without esophagitis No recent issues. On no prescription meds for this  4. Slow transit constipation Only has occasionally. Probiotic really helps. Will take a stool softner occasionally  5. GAD (generalized anxiety disorder) Is on combintion of klonopin and lexapro. Says she is doing well.    08/11/2022   10:22 AM 02/10/2022   10:35 AM 02/10/2022   10:10 AM 08/08/2021   10:38 AM  GAD 7 : Generalized Anxiety Score  Nervous, Anxious, on Edge 0 1 0 0  Control/stop worrying 0 0 0 0  Worry too much - different things 0 0 0 0  Trouble relaxing 0 0 0 0  Restless 0 1 0 0  Easily annoyed or irritable 0 0 0 0  Afraid - awful might happen 0 0 0 0  Total GAD 7 Score 0 2 0 0  Anxiety Difficulty Not difficult at all Not difficult at all Not difficult at all Not difficult at all      08/11/2022   10:21 AM 02/10/2022   10:35 AM 02/10/2022   10:10 AM  Depression screen PHQ 2/9  Decreased Interest 0 0 0  Down, Depressed, Hopeless 0 1 0  PHQ - 2  Score 0 1 0  Altered sleeping 0 1 0  Tired, decreased energy 0 1 0  Change in appetite 0 3 0  Feeling bad or failure about yourself  0 0 0  Trouble concentrating 0 0 0  Moving slowly or fidgety/restless 0 0 0  Suicidal thoughts 0 0 0  PHQ-9 Score 0 6 0  Difficult doing work/chores Not difficult at all Somewhat difficult Not difficult at all    .  6. Chronic gout without tophus, unspecified cause, unspecified site No recent flare ups  7. Primary insomnia Doing well. Sleeps about 6 hours a night  8. Vitamin D deficiency Daily vitamin d supplement Last vitamin D Lab Results  Component Value Date   VD25OH 15.4 (L) 11/27/2016     9. Obesity (BMI 30-39.9) No recent weight changes Wt Readings from Last 3 Encounters:  08/11/22 200 lb (90.7 kg)  07/16/22 200 lb (90.7 kg)  04/16/22 202 lb (91.6 kg)   BMI Readings from Last 3 Encounters:  08/11/22 33.28 kg/m  07/16/22 33.28 kg/m  04/16/22 33.61 kg/m      New complaints: None today  Allergies  Allergen Reactions   Amoxicillin Other (See Comments)  Causes yeast infection   Macrodantin [Nitrofurantoin Macrocrystal]    Vicodin [Hydrocodone-Acetaminophen]    Outpatient Encounter Medications as of 08/11/2022  Medication Sig   allopurinol (ZYLOPRIM) 100 MG tablet TAKE 1 TABLET BY MOUTH EVERY DAY   aspirin EC 81 MG tablet Take 81 mg by mouth daily.   atorvastatin (LIPITOR) 20 MG tablet TAKE 1 TABLET BY MOUTH EVERY DAY   Calcium Carb-Cholecalciferol (CALCIUM 500+D) 500-5 MG-MCG TABS Take 1 tablet by mouth daily.   Cholecalciferol (VITAMIN D3) 125 MCG (5000 UT) CAPS Take 1 capsule by mouth daily.   clonazePAM (KLONOPIN) 0.5 MG tablet Take 1 tablet (0.5 mg total) by mouth 2 (two) times daily as needed for anxiety.   escitalopram (LEXAPRO) 10 MG tablet TAKE 1 TABLET BY MOUTH EVERY DAY   estradiol (ESTRACE) 0.1 MG/GM vaginal cream Use 1/2 g vaginally two or three times per week as needed to maintain symptom relief.    fluticasone (FLONASE) 50 MCG/ACT nasal spray PLACE 1 SPRAY INTO BOTH NOSTRILS 2 (TWO) TIMES DAILY AS NEEDED FOR ALLERGIES OR RHINITIS.   hydrochlorothiazide (HYDRODIURIL) 50 MG tablet Take 1 tablet (50 mg total) by mouth daily.   lisinopril (ZESTRIL) 40 MG tablet TAKE 1 TABLET BY MOUTH EVERY DAY   Multiple Vitamin (MULTI-DAY PO) Take 1 tablet by mouth daily. L-Lysine   nystatin ointment (MYCOSTATIN) APPLY TO THE AFFECTED AREA(S) BY TOPICAL ROUTE 2 TIMES PER DAY   Probiotic Product (PROBIOTIC PO)    tamsulosin (FLOMAX) 0.4 MG CAPS capsule TAKE 1 CAPSULE (0.4 MG TOTAL) BY MOUTH DAILY AS NEEDED.   triamcinolone ointment (KENALOG) 0.1 % APPLY A THIN LAYER TO THE AFFECTED AREA(S) BY TOPICAL ROUTE 2 TIMES PER DAY   No facility-administered encounter medications on file as of 08/11/2022.    Past Surgical History:  Procedure Laterality Date   ABDOMINAL HYSTERECTOMY     FOOT SURGERY Bilateral    NASAL SEPTUM SURGERY     TONSILLECTOMY      Family History  Problem Relation Age of Onset   Heart disease Mother    Kidney disease Mother    Cancer Father        prostate   Heart disease Father    Kidney disease Father       Controlled substance contract: 02/11/22     Review of Systems  Constitutional:  Negative for diaphoresis.  Eyes:  Negative for pain.  Respiratory:  Negative for shortness of breath.   Cardiovascular:  Negative for chest pain, palpitations and leg swelling.  Gastrointestinal:  Negative for abdominal pain.  Endocrine: Negative for polydipsia.  Skin:  Negative for rash.  Neurological:  Negative for dizziness, weakness and headaches.  Hematological:  Does not bruise/bleed easily.  All other systems reviewed and are negative.      Objective:   Physical Exam Vitals and nursing note reviewed.  Constitutional:      General: She is not in acute distress.    Appearance: Normal appearance. She is well-developed.  HENT:     Head: Normocephalic.     Right Ear: Tympanic  membrane normal.     Left Ear: Tympanic membrane normal.     Nose: Nose normal.     Mouth/Throat:     Mouth: Mucous membranes are moist.  Eyes:     Pupils: Pupils are equal, round, and reactive to light.  Neck:     Vascular: No carotid bruit or JVD.  Cardiovascular:     Rate and Rhythm: Normal rate and regular rhythm.  Heart sounds: Normal heart sounds.  Pulmonary:     Effort: Pulmonary effort is normal. No respiratory distress.     Breath sounds: Normal breath sounds. No wheezing or rales.  Chest:     Chest wall: No tenderness.  Abdominal:     General: Bowel sounds are normal. There is no distension or abdominal bruit.     Palpations: Abdomen is soft. There is no hepatomegaly, splenomegaly, mass or pulsatile mass.     Tenderness: There is no abdominal tenderness.  Musculoskeletal:        General: Normal range of motion.     Cervical back: Normal range of motion and neck supple.  Lymphadenopathy:     Cervical: No cervical adenopathy.  Skin:    General: Skin is warm and dry.  Neurological:     Mental Status: She is alert and oriented to person, place, and time.     Deep Tendon Reflexes: Reflexes are normal and symmetric.  Psychiatric:        Behavior: Behavior normal.        Thought Content: Thought content normal.        Judgment: Judgment normal.    BP 133/71   Pulse 64   Temp (!) 97.4 F (36.3 C) (Temporal)   Resp 20   Ht 5\' 5"  (1.651 m)   Wt 200 lb (90.7 kg)   SpO2 96%   BMI 33.28 kg/m         Assessment & Plan:   Jane Howard comes in today with chief complaint of Medical Management of Chronic Issues   Diagnosis and orders addressed:  1. Essential hypertension, benign Low sodium diet - CBC with Differential/Platelet - CMP14+EGFR - hydrochlorothiazide (HYDRODIURIL) 50 MG tablet; Take 1 tablet (50 mg total) by mouth daily.  Dispense: 90 tablet; Refill: 1 - lisinopril (ZESTRIL) 40 MG tablet; Take 1 tablet (40 mg total) by mouth daily.  Dispense:  90 tablet; Refill: 1  2. Mixed hyperlipidemia Low fat diet - Lipid panel - atorvastatin (LIPITOR) 20 MG tablet; Take 1 tablet (20 mg total) by mouth daily.  Dispense: 90 tablet; Refill: 1  3. Gastroesophageal reflux disease without esophagitis Avoid spicy foods Do not eat 2 hours prior to bedtime  4. Slow transit constipation Increase fiber  5. GAD (generalized anxiety disorder) Stress management - clonazePAM (KLONOPIN) 0.5 MG tablet; Take 1 tablet (0.5 mg total) by mouth 2 (two) times daily as needed for anxiety.  Dispense: 60 tablet; Refill: 5 - escitalopram (LEXAPRO) 10 MG tablet; Take 1 tablet (10 mg total) by mouth daily.  Dispense: 90 tablet; Refill: 1  6. Chronic gout without tophus, unspecified cause, unspecified site  - allopurinol (ZYLOPRIM) 100 MG tablet; Take 1 tablet (100 mg total) by mouth daily.  Dispense: 90 tablet; Refill: 1  7. Primary insomnia Bedtime routine  8. Vitamin D deficiency Continue vitamin d supplement - VITAMIN D 25 Hydroxy (Vit-D Deficiency, Fractures)  9. Obesity (BMI 30-39.9) Discussed diet and exercise for person with BMI >25 Will recheck weight in 3-6 months    Labs pending Health Maintenance reviewed Diet and exercise encouraged  Follow up plan: 6 months   Mary-Margaret Daphine Deutscher, FNP

## 2022-08-12 LAB — CMP14+EGFR
ALT: 22 IU/L (ref 0–32)
AST: 18 IU/L (ref 0–40)
Albumin: 4.3 g/dL (ref 3.9–4.9)
Alkaline Phosphatase: 87 IU/L (ref 44–121)
BUN/Creatinine Ratio: 23 (ref 12–28)
BUN: 18 mg/dL (ref 8–27)
Bilirubin Total: 0.3 mg/dL (ref 0.0–1.2)
CO2: 24 mmol/L (ref 20–29)
Calcium: 9.6 mg/dL (ref 8.7–10.3)
Chloride: 102 mmol/L (ref 96–106)
Creatinine, Ser: 0.79 mg/dL (ref 0.57–1.00)
Globulin, Total: 2.6 g/dL (ref 1.5–4.5)
Glucose: 101 mg/dL — ABNORMAL HIGH (ref 70–99)
Potassium: 4.7 mmol/L (ref 3.5–5.2)
Sodium: 140 mmol/L (ref 134–144)
Total Protein: 6.9 g/dL (ref 6.0–8.5)
eGFR: 82 mL/min/{1.73_m2} (ref >59)

## 2022-08-12 LAB — CBC WITH DIFFERENTIAL/PLATELET
Basophils Absolute: 0.1 10*3/uL (ref 0.0–0.2)
Basos: 1 %
EOS (ABSOLUTE): 0.1 10*3/uL (ref 0.0–0.4)
Eos: 1 %
Hematocrit: 39.9 % (ref 34.0–46.6)
Hemoglobin: 13.5 g/dL (ref 11.1–15.9)
Immature Grans (Abs): 0 10*3/uL (ref 0.0–0.1)
Immature Granulocytes: 0 %
Lymphocytes Absolute: 2.3 10*3/uL (ref 0.7–3.1)
Lymphs: 30 %
MCH: 30.1 pg (ref 26.6–33.0)
MCHC: 33.8 g/dL (ref 31.5–35.7)
MCV: 89 fL (ref 79–97)
Monocytes Absolute: 0.5 10*3/uL (ref 0.1–0.9)
Monocytes: 6 %
Neutrophils Absolute: 4.7 10*3/uL (ref 1.4–7.0)
Neutrophils: 62 %
Platelets: 269 10*3/uL (ref 150–450)
RBC: 4.49 x10E6/uL (ref 3.77–5.28)
RDW: 12.3 % (ref 11.7–15.4)
WBC: 7.6 10*3/uL (ref 3.4–10.8)

## 2022-08-12 LAB — LIPID PANEL
Chol/HDL Ratio: 3.6 ratio (ref 0.0–4.4)
Cholesterol, Total: 176 mg/dL (ref 100–199)
HDL: 49 mg/dL (ref >39)
LDL Chol Calc (NIH): 89 mg/dL (ref 0–99)
Triglycerides: 224 mg/dL — ABNORMAL HIGH (ref 0–149)
VLDL Cholesterol Cal: 38 mg/dL (ref 5–40)

## 2022-08-12 LAB — VITAMIN D 25 HYDROXY (VIT D DEFICIENCY, FRACTURES): Vit D, 25-Hydroxy: 46 ng/mL (ref 30.0–100.0)

## 2022-10-08 NOTE — Progress Notes (Signed)
GYNECOLOGY  VISIT   HPI: 67 y.o.   Married  Caucasian  female   G2P2 with No LMP recorded. Patient has had a hysterectomy.   here for   3 mo pessary check  No vaginal bleeding, odor, discharge, or irritation.   Uses vaginal estrogen cream twice weekly.   Voiding well.  Has some constipation, which is normal for her.  Takes a probiotic.   GYNECOLOGIC HISTORY: No LMP recorded. Patient has had a hysterectomy. Contraception:  hyst Menopausal hormone therapy:  estrace Last mammogram:  01/13/22 - BI-RADS1, cat C density, 06/29/20, Breast Density Category B, BI-RADS CATEGORY 1 Negative  Last pap smear:   2016 at [facility]         OB History     Gravida  2   Para  2   Term      Preterm      AB      Living         SAB      IAB      Ectopic      Multiple      Live Births                 Patient Active Problem List   Diagnosis Date Noted   Constipation 04/16/2022   Candidiasis of skin 01/31/2022   Family history of malignant neoplasm of digestive organs 01/31/2022   Primary insomnia 02/06/2021   Gastroesophageal reflux disease without esophagitis 02/06/2020   Vitamin D deficiency 12/01/2016   Hyperlipidemia 11/27/2016   Obesity (BMI 30-39.9) 11/06/2015   Gout 11/17/2013   Essential hypertension, benign 11/17/2013   GAD (generalized anxiety disorder) 11/17/2013    Past Medical History:  Diagnosis Date   Anxiety    Nephrolithiasis     Past Surgical History:  Procedure Laterality Date   ABDOMINAL HYSTERECTOMY     FOOT SURGERY Bilateral    NASAL SEPTUM SURGERY     TONSILLECTOMY      Current Outpatient Medications  Medication Sig Dispense Refill   allopurinol (ZYLOPRIM) 100 MG tablet Take 1 tablet (100 mg total) by mouth daily. 90 tablet 1   aspirin EC 81 MG tablet Take 81 mg by mouth daily.     atorvastatin (LIPITOR) 20 MG tablet Take 1 tablet (20 mg total) by mouth daily. 90 tablet 1   Calcium Carb-Cholecalciferol (CALCIUM 500+D) 500-5  MG-MCG TABS Take 1 tablet by mouth daily.     Cholecalciferol (VITAMIN D3) 125 MCG (5000 UT) CAPS Take 1 capsule by mouth daily.     clonazePAM (KLONOPIN) 0.5 MG tablet Take 1 tablet (0.5 mg total) by mouth 2 (two) times daily as needed for anxiety. 60 tablet 5   escitalopram (LEXAPRO) 10 MG tablet Take 1 tablet (10 mg total) by mouth daily. 90 tablet 1   estradiol (ESTRACE) 0.1 MG/GM vaginal cream Use 1/2 g vaginally two or three times per week as needed to maintain symptom relief. 42.5 g 1   fluticasone (FLONASE) 50 MCG/ACT nasal spray PLACE 1 SPRAY INTO BOTH NOSTRILS 2 (TWO) TIMES DAILY AS NEEDED FOR ALLERGIES OR RHINITIS. 48 mL 1   hydrochlorothiazide (HYDRODIURIL) 50 MG tablet Take 1 tablet (50 mg total) by mouth daily. 90 tablet 1   lisinopril (ZESTRIL) 40 MG tablet Take 1 tablet (40 mg total) by mouth daily. 90 tablet 1   Multiple Vitamin (MULTI-DAY PO) Take 1 tablet by mouth daily. L-Lysine     nystatin ointment (MYCOSTATIN) APPLY TO THE AFFECTED  AREA(S) BY TOPICAL ROUTE 2 TIMES PER DAY     Probiotic Product (PROBIOTIC PO)      tamsulosin (FLOMAX) 0.4 MG CAPS capsule TAKE 1 CAPSULE (0.4 MG TOTAL) BY MOUTH DAILY AS NEEDED. 90 capsule 0   triamcinolone ointment (KENALOG) 0.1 % APPLY A THIN LAYER TO THE AFFECTED AREA(S) BY TOPICAL ROUTE 2 TIMES PER DAY     No current facility-administered medications for this visit.     ALLERGIES: Amoxicillin, Macrodantin [nitrofurantoin macrocrystal], and Vicodin [hydrocodone-acetaminophen]  Family History  Problem Relation Age of Onset   Heart disease Mother    Kidney disease Mother    Cancer Father        prostate   Heart disease Father    Kidney disease Father     Social History   Socioeconomic History   Marital status: Married    Spouse name: Not on file   Number of children: Not on file   Years of education: Not on file   Highest education level: Not on file  Occupational History   Not on file  Tobacco Use   Smoking status: Never    Smokeless tobacco: Never  Vaping Use   Vaping status: Never Used  Substance and Sexual Activity   Alcohol use: Yes    Comment: 1 drink/month   Drug use: No   Sexual activity: Not Currently    Birth control/protection: Surgical    Comment: hysterectomy  Other Topics Concern   Not on file  Social History Narrative   Not on file   Social Determinants of Health   Financial Resource Strain: Not on file  Food Insecurity: Not on file  Transportation Needs: Not on file  Physical Activity: Not on file  Stress: Not on file  Social Connections: Not on file  Intimate Partner Violence: Not on file    Review of Systems  All other systems reviewed and are negative.   PHYSICAL EXAMINATION:    BP 130/76 (BP Location: Left Arm, Patient Position: Sitting, Cuff Size: Normal)   Ht 5\' 5"  (1.651 m)   Wt 196 lb (88.9 kg)   BMI 32.62 kg/m     General appearance: alert, cooperative and appears stated age    Pelvic: External genitalia:  no lesions              Urethra:  normal appearing urethra with no masses, tenderness or lesions              Bartholins and Skenes: normal                 Vagina: normal appearing vagina with normal color and discharge, no lesions              Cervix: no lesions                Bimanual Exam:  Uterus:  normal size, contour, position, consistency, mobility, non-tender              Adnexa: no mass, fullness, tenderness        Gelhorn pessary removed, cleansed, and replaced with 1 gram estradiol cream.   Chaperone was present for exam:  Warren Lacy, CMA  ASSESSMENT  Midline cystocele. Pessary maintenance.  Doing well.  Constipation.   PLAN  Continue current pessary care. Continue current use of estradiol cream.  We discussed increase in water and fiber in diet, physical activity, and Miralax or Senna-S to treat constipation.  FU in 3 months and prn.   16  min  total time was spent for this patient encounter, including preparation, face-to-face  counseling with the patient, coordination of care, and documentation of the encounter.

## 2022-10-22 ENCOUNTER — Ambulatory Visit (INDEPENDENT_AMBULATORY_CARE_PROVIDER_SITE_OTHER): Payer: Medicare Other | Admitting: Obstetrics and Gynecology

## 2022-10-22 ENCOUNTER — Encounter: Payer: Self-pay | Admitting: Obstetrics and Gynecology

## 2022-10-22 VITALS — BP 130/76 | Ht 65.0 in | Wt 196.0 lb

## 2022-10-22 DIAGNOSIS — Z4689 Encounter for fitting and adjustment of other specified devices: Secondary | ICD-10-CM | POA: Diagnosis not present

## 2022-10-22 DIAGNOSIS — N8111 Cystocele, midline: Secondary | ICD-10-CM

## 2022-10-22 NOTE — Patient Instructions (Addendum)
Consider Miralax or Senna-S for constipation if needed.

## 2022-11-18 ENCOUNTER — Other Ambulatory Visit: Payer: Self-pay | Admitting: Nurse Practitioner

## 2022-11-18 DIAGNOSIS — F411 Generalized anxiety disorder: Secondary | ICD-10-CM

## 2023-01-21 ENCOUNTER — Ambulatory Visit: Payer: Medicare Other | Admitting: Obstetrics and Gynecology

## 2023-01-21 ENCOUNTER — Encounter: Payer: Self-pay | Admitting: Obstetrics and Gynecology

## 2023-01-21 ENCOUNTER — Telehealth: Payer: Self-pay | Admitting: Obstetrics and Gynecology

## 2023-01-21 VITALS — BP 122/70 | HR 75 | Ht 65.0 in | Wt 202.0 lb

## 2023-01-21 DIAGNOSIS — Z4689 Encounter for fitting and adjustment of other specified devices: Secondary | ICD-10-CM

## 2023-01-21 DIAGNOSIS — N393 Stress incontinence (female) (male): Secondary | ICD-10-CM | POA: Diagnosis not present

## 2023-01-21 DIAGNOSIS — N819 Female genital prolapse, unspecified: Secondary | ICD-10-CM | POA: Diagnosis not present

## 2023-01-21 NOTE — Progress Notes (Signed)
GYNECOLOGY  VISIT   HPI: 67 y.o.   Married  Caucasian female   G2P2 with No LMP recorded. Patient has had a hysterectomy.   here for: 3 mo pessary check.  Using a Gelhorn pessary.   Wants to consider surgery.   Leaks urine in the am when she is going to the restroom to void.  No enuresis.  Leaks with sneeze, deep cough, exercise.   Has constipation and uses a stool softener and Metamucil.  No splinting.  No accidental leakage of stool.   Using vaginal estradiol cream.    Does not need refills.   E Coli UTI 01/04/23.  Symptoms resolved.   GYNECOLOGIC HISTORY: No LMP recorded. Patient has had a hysterectomy. Contraception:  hyst Menopausal hormone therapy:  estrace Last 2 paps:  2016 at [facility] History of abnormal Pap or positive HPV:  no Mammogram:  01/13/22 - BI-RADS1, cat C density,          OB History     Gravida  2   Para  2   Term      Preterm      AB      Living         SAB      IAB      Ectopic      Multiple      Live Births                 Patient Active Problem List   Diagnosis Date Noted   Constipation 04/16/2022   Candidiasis of skin 01/31/2022   Family history of malignant neoplasm of digestive organs 01/31/2022   Primary insomnia 02/06/2021   Gastroesophageal reflux disease without esophagitis 02/06/2020   Vitamin D deficiency 12/01/2016   Hyperlipidemia 11/27/2016   Obesity (BMI 30-39.9) 11/06/2015   Gout 11/17/2013   Essential hypertension, benign 11/17/2013   GAD (generalized anxiety disorder) 11/17/2013    Past Medical History:  Diagnosis Date   Anxiety    Nephrolithiasis     Past Surgical History:  Procedure Laterality Date   ABDOMINAL HYSTERECTOMY     FOOT SURGERY Bilateral    NASAL SEPTUM SURGERY     TONSILLECTOMY      Current Outpatient Medications  Medication Sig Dispense Refill   allopurinol (ZYLOPRIM) 100 MG tablet Take 1 tablet (100 mg total) by mouth daily. 90 tablet 1   aspirin EC 81  MG tablet Take 81 mg by mouth daily.     atorvastatin (LIPITOR) 20 MG tablet Take 1 tablet (20 mg total) by mouth daily. 90 tablet 1   Calcium Carb-Cholecalciferol (CALCIUM 500+D) 500-5 MG-MCG TABS Take 1 tablet by mouth daily.     Cholecalciferol (VITAMIN D3) 125 MCG (5000 UT) CAPS Take 1 capsule by mouth daily.     clonazePAM (KLONOPIN) 0.5 MG tablet Take 1 tablet (0.5 mg total) by mouth 2 (two) times daily as needed for anxiety. 60 tablet 5   escitalopram (LEXAPRO) 10 MG tablet Take 1 tablet (10 mg total) by mouth daily. 90 tablet 1   estradiol (ESTRACE) 0.1 MG/GM vaginal cream Use 1/2 g vaginally two or three times per week as needed to maintain symptom relief. 42.5 g 1   fluticasone (FLONASE) 50 MCG/ACT nasal spray PLACE 1 SPRAY INTO BOTH NOSTRILS 2 (TWO) TIMES DAILY AS NEEDED FOR ALLERGIES OR RHINITIS. 48 mL 1   hydrochlorothiazide (HYDRODIURIL) 50 MG tablet Take 1 tablet (50 mg total) by mouth daily. 90 tablet 1  lisinopril (ZESTRIL) 40 MG tablet Take 1 tablet (40 mg total) by mouth daily. 90 tablet 1   Multiple Vitamin (MULTI-DAY PO) Take 1 tablet by mouth daily. L-Lysine     nystatin ointment (MYCOSTATIN) APPLY TO THE AFFECTED AREA(S) BY TOPICAL ROUTE 2 TIMES PER DAY     Probiotic Product (PROBIOTIC PO)      tamsulosin (FLOMAX) 0.4 MG CAPS capsule TAKE 1 CAPSULE (0.4 MG TOTAL) BY MOUTH DAILY AS NEEDED. 90 capsule 0   triamcinolone ointment (KENALOG) 0.1 % APPLY A THIN LAYER TO THE AFFECTED AREA(S) BY TOPICAL ROUTE 2 TIMES PER DAY     No current facility-administered medications for this visit.     ALLERGIES: Amoxicillin, Macrodantin [nitrofurantoin macrocrystal], and Vicodin [hydrocodone-acetaminophen]  Family History  Problem Relation Age of Onset   Heart disease Mother    Kidney disease Mother    Cancer Father        prostate   Heart disease Father    Kidney disease Father     Social History   Socioeconomic History   Marital status: Married    Spouse name: Not on file    Number of children: Not on file   Years of education: Not on file   Highest education level: Not on file  Occupational History   Not on file  Tobacco Use   Smoking status: Never   Smokeless tobacco: Never  Vaping Use   Vaping status: Never Used  Substance and Sexual Activity   Alcohol use: Yes    Comment: 1 drink/month   Drug use: No   Sexual activity: Not Currently    Birth control/protection: Surgical    Comment: hysterectomy  Other Topics Concern   Not on file  Social History Narrative   Not on file   Social Drivers of Health   Financial Resource Strain: Not on file  Food Insecurity: Not on file  Transportation Needs: Not on file  Physical Activity: Not on file  Stress: Not on file  Social Connections: Not on file  Intimate Partner Violence: Not on file    Review of Systems  All other systems reviewed and are negative.   PHYSICAL EXAMINATION:   BP 122/70 (BP Location: Left Arm, Patient Position: Sitting, Cuff Size: Large)   Pulse 75   Ht 5\' 5"  (1.651 m)   Wt 202 lb (91.6 kg)   SpO2 99%   BMI 33.61 kg/m     General appearance: alert, cooperative and appears stated age  Pelvic: External genitalia:  no lesions              Urethra:  normal appearing urethra with no masses, tenderness or lesions              Bartholins and Skenes: normal                 Vagina: normal appearing vagina with normal color and discharge, no lesions.  Third degree cystocele, first degree vaginal vault prolapse, first degree rectocele.               Cervix: absent                Bimanual Exam:  Uterus:  absent.               Adnexa: no mass, fullness, tenderness              Rectal exam: Yes.  .  Confirms.  Anus:  normal sphincter tone, no lesions  Gelhorn pessary removed, cleansed, and replaced.   Chaperone was present for exam:  Warren Lacy, CMA  ASSESSMENT:  Status post TVH, anterior colporrhaphy. Pelvic organ prolapse.  Cystocele, apical prolapse, rectocele.   Stress urinary incontinence.  Recent UTI.  Pessary maintenance.   PLAN:  Will refer to [facility]. Continue current pessary use.  Continue vaginal estradiol cream.  FU in 3 months for breast and pelvic exam and pessary care.   24 min  total time was spent for this patient encounter, including preparation, face-to-face counseling with the patient, coordination of care, and documentation of the encounter.

## 2023-01-21 NOTE — Telephone Encounter (Signed)
Referral sent. Mychart msg sent to pt informing her of location/contact info for referral location.

## 2023-01-21 NOTE — Telephone Encounter (Signed)
Please make referral to [facility] for pelvic organ prolapse and stress urinary incontinence.   My patient is using a Gelhorn pessary and would like to proceed with potential surgical care.

## 2023-01-22 NOTE — Telephone Encounter (Signed)
Pt scheduled for appt w/ Dr.Yuen on 04/02/2023.   Routing to provider for final review and closing encounter.

## 2023-02-16 ENCOUNTER — Encounter: Payer: Self-pay | Admitting: Nurse Practitioner

## 2023-02-16 ENCOUNTER — Ambulatory Visit: Payer: Medicare Other | Admitting: Nurse Practitioner

## 2023-02-16 ENCOUNTER — Ambulatory Visit (INDEPENDENT_AMBULATORY_CARE_PROVIDER_SITE_OTHER): Payer: Medicare Other

## 2023-02-16 VITALS — BP 115/75 | HR 70 | Temp 97.6°F | Ht 65.0 in | Wt 204.0 lb

## 2023-02-16 DIAGNOSIS — F411 Generalized anxiety disorder: Secondary | ICD-10-CM | POA: Diagnosis not present

## 2023-02-16 DIAGNOSIS — Z09 Encounter for follow-up examination after completed treatment for conditions other than malignant neoplasm: Secondary | ICD-10-CM | POA: Diagnosis not present

## 2023-02-16 DIAGNOSIS — E782 Mixed hyperlipidemia: Secondary | ICD-10-CM

## 2023-02-16 DIAGNOSIS — Z780 Asymptomatic menopausal state: Secondary | ICD-10-CM | POA: Diagnosis not present

## 2023-02-16 DIAGNOSIS — M1A9XX0 Chronic gout, unspecified, without tophus (tophi): Secondary | ICD-10-CM | POA: Diagnosis not present

## 2023-02-16 DIAGNOSIS — Z13820 Encounter for screening for osteoporosis: Secondary | ICD-10-CM

## 2023-02-16 DIAGNOSIS — E669 Obesity, unspecified: Secondary | ICD-10-CM

## 2023-02-16 DIAGNOSIS — I10 Essential (primary) hypertension: Secondary | ICD-10-CM

## 2023-02-16 DIAGNOSIS — K219 Gastro-esophageal reflux disease without esophagitis: Secondary | ICD-10-CM

## 2023-02-16 DIAGNOSIS — E559 Vitamin D deficiency, unspecified: Secondary | ICD-10-CM

## 2023-02-16 DIAGNOSIS — K5901 Slow transit constipation: Secondary | ICD-10-CM | POA: Diagnosis not present

## 2023-02-16 DIAGNOSIS — F5101 Primary insomnia: Secondary | ICD-10-CM

## 2023-02-16 LAB — CBC WITH DIFFERENTIAL/PLATELET
Basophils Absolute: 0.1 10*3/uL (ref 0.0–0.2)
Basos: 1 %
EOS (ABSOLUTE): 0.1 10*3/uL (ref 0.0–0.4)
Eos: 2 %
Hematocrit: 43 % (ref 34.0–46.6)
Hemoglobin: 14 g/dL (ref 11.1–15.9)
Immature Grans (Abs): 0 10*3/uL (ref 0.0–0.1)
Immature Granulocytes: 0 %
Lymphocytes Absolute: 2.2 10*3/uL (ref 0.7–3.1)
Lymphs: 36 %
MCH: 29.2 pg (ref 26.6–33.0)
MCHC: 32.6 g/dL (ref 31.5–35.7)
MCV: 90 fL (ref 79–97)
Monocytes Absolute: 0.4 10*3/uL (ref 0.1–0.9)
Monocytes: 6 %
Neutrophils Absolute: 3.4 10*3/uL (ref 1.4–7.0)
Neutrophils: 55 %
Platelets: 247 10*3/uL (ref 150–450)
RBC: 4.79 x10E6/uL (ref 3.77–5.28)
RDW: 12.1 % (ref 11.7–15.4)
WBC: 6.2 10*3/uL (ref 3.4–10.8)

## 2023-02-16 LAB — CMP14+EGFR
ALT: 19 [IU]/L (ref 0–32)
AST: 17 [IU]/L (ref 0–40)
Albumin: 4.1 g/dL (ref 3.9–4.9)
Alkaline Phosphatase: 84 [IU]/L (ref 44–121)
BUN/Creatinine Ratio: 26 (ref 12–28)
BUN: 22 mg/dL (ref 8–27)
Bilirubin Total: 0.4 mg/dL (ref 0.0–1.2)
CO2: 23 mmol/L (ref 20–29)
Calcium: 9.4 mg/dL (ref 8.7–10.3)
Chloride: 105 mmol/L (ref 96–106)
Creatinine, Ser: 0.86 mg/dL (ref 0.57–1.00)
Globulin, Total: 2.5 g/dL (ref 1.5–4.5)
Glucose: 114 mg/dL — ABNORMAL HIGH (ref 70–99)
Potassium: 4.8 mmol/L (ref 3.5–5.2)
Sodium: 141 mmol/L (ref 134–144)
Total Protein: 6.6 g/dL (ref 6.0–8.5)
eGFR: 74 mL/min/{1.73_m2} (ref >59)

## 2023-02-16 LAB — LIPID PANEL
Chol/HDL Ratio: 3.4 {ratio} (ref 0.0–4.4)
Cholesterol, Total: 185 mg/dL (ref 100–199)
HDL: 54 mg/dL (ref >39)
LDL Chol Calc (NIH): 108 mg/dL — ABNORMAL HIGH (ref 0–99)
Triglycerides: 131 mg/dL (ref 0–149)
VLDL Cholesterol Cal: 23 mg/dL (ref 5–40)

## 2023-02-16 MED ORDER — ESCITALOPRAM OXALATE 10 MG PO TABS: 10.0000 mg | ORAL_TABLET | Freq: Every day | ORAL | 1 refills | Status: DC

## 2023-02-16 MED ORDER — LISINOPRIL 40 MG PO TABS: 40.0000 mg | ORAL_TABLET | Freq: Every day | ORAL | 1 refills | Status: DC

## 2023-02-16 MED ORDER — HYDROCHLOROTHIAZIDE 50 MG PO TABS: 50.0000 mg | ORAL_TABLET | Freq: Every day | ORAL | 1 refills | Status: DC

## 2023-02-16 MED ORDER — ATORVASTATIN CALCIUM 20 MG PO TABS
20.0000 mg | ORAL_TABLET | Freq: Every day | ORAL | 1 refills | Status: DC
Start: 2023-02-16 — End: 2023-08-17

## 2023-02-16 MED ORDER — FLUTICASONE PROPIONATE 50 MCG/ACT NA SUSP: 1.0000 | Freq: Two times a day (BID) | NASAL | 1 refills | Status: DC | PRN

## 2023-02-16 MED ORDER — CLONAZEPAM 0.5 MG PO TABS
0.5000 mg | ORAL_TABLET | Freq: Two times a day (BID) | ORAL | 5 refills | Status: DC | PRN
Start: 2023-02-16 — End: 2023-08-17

## 2023-02-16 MED ORDER — ALLOPURINOL 100 MG PO TABS: 100.0000 mg | ORAL_TABLET | Freq: Every day | ORAL | 1 refills | Status: DC

## 2023-02-16 NOTE — Progress Notes (Signed)
 Subjective:    Patient ID: [facility], female    DOB: July 12, 1955, 68 y.o.   MRN: [id number]   Chief Complaint: medical management of chronic issues     HPI:  [facility] is a 68 y.o. who identifies as a female who was assigned female at birth.   Social history: Lives with: her grandson still lives with her Work history: retired   Water Engineer in today for follow up of the following chronic medical issues:  1. Essential hypertension, benign No c/o chest pain, sob or headache. Doe snot check blood pressure at home BP Readings from Last 3 Encounters:  01/21/23 122/70  10/22/22 130/76  08/11/22 133/71     2. Mixed hyperlipidemia Doe snot watch diet very closely. No exercise. Lab Results  Component Value Date   CHOL 176 08/11/2022   HDL 49 08/11/2022   LDLCALC 89 08/11/2022   TRIG 224 (H) 08/11/2022   CHOLHDL 3.6 08/11/2022   The 10-year ASCVD risk score (Arnett DK, et al., 2019) is: 8.2%   3. Gastroesophageal reflux disease without esophagitis No recent issues. On no prescription meds for this  4. Slow transit constipation Only has occasionally. Probiotic really helps. Will take a stool softner occasionally  5. GAD (generalized anxiety disorder) Is on combintion of klonopin  and lexapro . Says she is doing well.     08/11/2022   10:22 AM 02/10/2022   10:35 AM 02/10/2022   10:10 AM 08/08/2021   10:38 AM  GAD 7 : Generalized Anxiety Score  Nervous, Anxious, on Edge 0 1 0 0  Control/stop worrying 0 0 0 0  Worry too much - different things 0 0 0 0  Trouble relaxing 0 0 0 0  Restless 0 1 0 0  Easily annoyed or irritable 0 0 0 0  Afraid - awful might happen 0 0 0 0  Total GAD 7 Score 0 2 0 0  Anxiety Difficulty Not difficult at all Not difficult at all Not difficult at all Not difficult at all    Contract signed 02/16/23- urine drug screen today  .  6. Chronic gout without tophus, unspecified cause, unspecified site No recent flare ups  7. Primary  insomnia Doing well. Sleeps about 6 hours a night  8. Vitamin D  deficiency Daily vitamin d  supplement Last vitamin D  Lab Results  Component Value Date   VD25OH 46.0 08/11/2022     9. Obesity (BMI 30-39.9) No recent weight changes  Wt Readings from Last 3 Encounters:  02/16/23 204 lb (92.5 kg)  01/21/23 202 lb (91.6 kg)  10/22/22 196 lb (88.9 kg)   BMI Readings from Last 3 Encounters:  02/16/23 33.95 kg/m  01/21/23 33.61 kg/m  10/22/22 32.62 kg/m       New complaints: None today  Allergies  Allergen Reactions   Amoxicillin  Other (See Comments)    Causes yeast infection   Macrodantin [Nitrofurantoin Macrocrystal]    Vicodin [Hydrocodone -Acetaminophen ]    Outpatient Encounter Medications as of 02/16/2023  Medication Sig   allopurinol  (ZYLOPRIM ) 100 MG tablet Take 1 tablet (100 mg total) by mouth daily.   aspirin EC 81 MG tablet Take 81 mg by mouth daily.   atorvastatin  (LIPITOR) 20 MG tablet Take 1 tablet (20 mg total) by mouth daily.   Calcium  Carb-Cholecalciferol (CALCIUM  500+D) 500-5 MG-MCG TABS Take 1 tablet by mouth daily.   Cholecalciferol (VITAMIN D3) 125 MCG (5000 UT) CAPS Take 1 capsule by mouth daily.   clonazePAM  (KLONOPIN ) 0.5 MG tablet  Take 1 tablet (0.5 mg total) by mouth 2 (two) times daily as needed for anxiety.   escitalopram  (LEXAPRO ) 10 MG tablet Take 1 tablet (10 mg total) by mouth daily.   estradiol  (ESTRACE ) 0.1 MG/GM vaginal cream Use 1/2 g vaginally two or three times per week as needed to maintain symptom relief.   fluticasone  (FLONASE ) 50 MCG/ACT nasal spray PLACE 1 SPRAY INTO BOTH NOSTRILS 2 (TWO) TIMES DAILY AS NEEDED FOR ALLERGIES OR RHINITIS.   hydrochlorothiazide  (HYDRODIURIL ) 50 MG tablet Take 1 tablet (50 mg total) by mouth daily.   lisinopril  (ZESTRIL ) 40 MG tablet Take 1 tablet (40 mg total) by mouth daily.   Multiple Vitamin (MULTI-DAY PO) Take 1 tablet by mouth daily. L-Lysine   nystatin  ointment (MYCOSTATIN ) APPLY TO THE  AFFECTED AREA(S) BY TOPICAL ROUTE 2 TIMES PER DAY   Probiotic Product (PROBIOTIC PO)    tamsulosin  (FLOMAX ) 0.4 MG CAPS capsule TAKE 1 CAPSULE (0.4 MG TOTAL) BY MOUTH DAILY AS NEEDED.   triamcinolone ointment (KENALOG) 0.1 % APPLY A THIN LAYER TO THE AFFECTED AREA(S) BY TOPICAL ROUTE 2 TIMES PER DAY   No facility-administered encounter medications on file as of 02/16/2023.    Past Surgical History:  Procedure Laterality Date   ABDOMINAL HYSTERECTOMY     FOOT SURGERY Bilateral    NASAL SEPTUM SURGERY     TONSILLECTOMY      Family History  Problem Relation Age of Onset   Heart disease Mother    Kidney disease Mother    Cancer Father        prostate   Heart disease Father    Kidney disease Father       Controlled substance contract: 02/11/22     Review of Systems  Constitutional:  Negative for diaphoresis.  Eyes:  Negative for pain.  Respiratory:  Negative for shortness of breath.   Cardiovascular:  Negative for chest pain, palpitations and leg swelling.  Gastrointestinal:  Negative for abdominal pain.  Endocrine: Negative for polydipsia.  Skin:  Negative for rash.  Neurological:  Negative for dizziness, weakness and headaches.  Hematological:  Does not bruise/bleed easily.  All other systems reviewed and are negative.      Objective:   Physical Exam Vitals and nursing note reviewed.  Constitutional:      General: She is not in acute distress.    Appearance: Normal appearance. She is well-developed.  HENT:     Head: Normocephalic.     Right Ear: Tympanic membrane normal.     Left Ear: Tympanic membrane normal.     Nose: Nose normal.     Mouth/Throat:     Mouth: Mucous membranes are moist.  Eyes:     Pupils: Pupils are equal, round, and reactive to light.  Neck:     Vascular: No carotid bruit or JVD.  Cardiovascular:     Rate and Rhythm: Normal rate and regular rhythm.     Heart sounds: Normal heart sounds.  Pulmonary:     Effort: Pulmonary effort is  normal. No respiratory distress.     Breath sounds: Normal breath sounds. No wheezing or rales.  Chest:     Chest wall: No tenderness.  Abdominal:     General: Bowel sounds are normal. There is no distension or abdominal bruit.     Palpations: Abdomen is soft. There is no hepatomegaly, splenomegaly, mass or pulsatile mass.     Tenderness: There is no abdominal tenderness.  Musculoskeletal:  General: Normal range of motion.     Cervical back: Normal range of motion and neck supple.  Lymphadenopathy:     Cervical: No cervical adenopathy.  Skin:    General: Skin is warm and dry.  Neurological:     Mental Status: She is alert and oriented to person, place, and time.     Deep Tendon Reflexes: Reflexes are normal and symmetric.  Psychiatric:        Behavior: Behavior normal.        Thought Content: Thought content normal.        Judgment: Judgment normal.    BP 115/75   Pulse 70   Temp 97.6 F (36.4 C) (Temporal)   Ht 5' 5 (1.651 m)   Wt 204 lb (92.5 kg)   SpO2 96%   BMI 33.95 kg/m          Assessment & Plan:   [facility] comes in today with chief complaint of No chief complaint on file.   Diagnosis and orders addressed:  1. Essential hypertension, benign Low sodium diet - CBC with Differential/Platelet - CMP14+EGFR - hydrochlorothiazide  (HYDRODIURIL ) 50 MG tablet; Take 1 tablet (50 mg total) by mouth daily.  Dispense: 90 tablet; Refill: 1 - lisinopril  (ZESTRIL ) 40 MG tablet; Take 1 tablet (40 mg total) by mouth daily.  Dispense: 90 tablet; Refill: 1  2. Mixed hyperlipidemia Low fat diet - Lipid panel - atorvastatin  (LIPITOR) 20 MG tablet; Take 1 tablet (20 mg total) by mouth daily.  Dispense: 90 tablet; Refill: 1  3. Gastroesophageal reflux disease without esophagitis Avoid spicy foods Do not eat 2 hours prior to bedtime  4. Slow transit constipation Increase fiber  5. GAD (generalized anxiety disorder) Stress management - clonazePAM   (KLONOPIN ) 0.5 MG tablet; Take 1 tablet (0.5 mg total) by mouth 2 (two) times daily as needed for anxiety.  Dispense: 60 tablet; Refill: 5 - escitalopram  (LEXAPRO ) 10 MG tablet; Take 1 tablet (10 mg total) by mouth daily.  Dispense: 90 tablet; Refill: 1  6. Chronic gout without tophus, unspecified cause, unspecified site  - allopurinol  (ZYLOPRIM ) 100 MG tablet; Take 1 tablet (100 mg total) by mouth daily.  Dispense: 90 tablet; Refill: 1  7. Primary insomnia Bedtime routine  8. Vitamin D  deficiency Continue vitamin d  supplement - VITAMIN D  25 Hydroxy (Vit-D Deficiency, Fractures)  9. Obesity (BMI 30-39.9) Discussed diet and exercise for person with BMI >25 Will recheck weight in 3-6 months    Labs pending Health Maintenance reviewed Diet and exercise encouraged  Follow up plan: 6 months   Mary-Margaret Gladis, FNP

## 2023-02-16 NOTE — Addendum Note (Signed)
 Addended by: Cleda Daub on: 02/16/2023 10:33 AM   Modules accepted: Orders

## 2023-02-16 NOTE — Patient Instructions (Signed)
 Bone Health Bones protect organs, store calcium, anchor muscles, and support the whole body. Keeping your bones strong is important, especially as you get older. You can take actions to help keep your bones strong and healthy. Why is keeping my bones healthy important?  Keeping your bones healthy is important because your body constantly replaces bone cells. Cells get old, and new cells take their place. As we age, we lose bone cells because the body may not be able to make enough new cells to replace the old cells. The amount of bone cells and bone tissue you have is referred to as bone mass. The higher your bone mass, the stronger your bones. The aging process leads to an overall loss of bone mass in the body, which can increase the likelihood of: Broken bones. A condition in which the bones become weak and brittle (osteoporosis). A large decline in bone mass occurs in older adults. In women, it occurs about the time of menopause. What actions can I take to keep my bones healthy? Good health habits are important for maintaining healthy bones. This includes eating nutritious foods and exercising regularly. To have healthy bones, you need to get enough of the right minerals and vitamins. Most nutrition experts recommend getting these nutrients from the foods that you eat. In some cases, taking supplements may also be recommended. Doing certain types of exercise is also important for bone health. What are the nutritional recommendations for healthy bones?  Eating a well-balanced diet with plenty of calcium and vitamin D will help to protect your bones. Nutritional recommendations vary from person to person. Ask your health care provider what is healthy for you. Here are some general guidelines. Get enough calcium Calcium is the most important (essential) mineral for bone health. Most people can get enough calcium from their diet, but supplements may be recommended for people who are at risk for  osteoporosis. Good sources of calcium include: Dairy products, such as low-fat or nonfat milk, cheese, and yogurt. Dark green leafy vegetables, such as bok choy and broccoli. Foods that have calcium added to them (are fortified). Foods that may be fortified with calcium include orange juice, cereal, bread, soy beverages, and tofu products. Nuts, such as almonds. Follow these recommended amounts for daily calcium intake: Infants, 0-6 months: 200 mg. Infants, 6-12 months: 260 mg. Children, age 647-3: 700 mg. Children, age 64-8: 1,000 mg. Children, age 642-13: 1,300 mg. Teens, age 38-18: 1,300 mg. Adults, age 68-50: 1,000 mg. Adults, age 23-70: Men: 1,000 mg. Women: 1,200 mg. Adults, age 97 or older: 1,200 mg. Pregnant and breastfeeding females: Teens: 1,300 mg. Adults: 1,000 mg. Get enough vitamin D Vitamin D is the most essential vitamin for bone health. It helps the body absorb calcium. Sunlight stimulates the skin to make vitamin D, so be sure to get enough sunlight. If you live in a cold climate or you do not get outside often, your health care provider may recommend that you take vitamin D supplements. Good sources of vitamin D in your diet include: Egg yolks. Saltwater fish. Milk and cereal fortified with vitamin D. Follow these recommended amounts for daily vitamin D intake: Infants, 0-12 months: 400 international units (IU). Children and teens, age 647-18: 600 international units. Adults, age 31 or younger: 600 international units. Adults, age 9 or older: 600-1,000 international units. Get other important nutrients Other nutrients that are important for bone health include: Phosphorus. This mineral is found in meat, poultry, dairy foods, nuts, and legumes. The  recommended daily intake for adult men and adult women is 700 mg. Magnesium. This mineral is found in seeds, nuts, dark green vegetables, and legumes. The recommended daily intake for adult men is 400-420 mg. For adult women,  it is 310-320 mg. Vitamin K. This vitamin is found in green leafy vegetables. The recommended daily intake is 120 mcg for adult men and 90 mcg for adult women. What type of physical activity is best for building and maintaining healthy bones? Weight-bearing and strength-building activities are important for building and maintaining healthy bones. Weight-bearing activities cause muscles and bones to work against gravity. Strength-building activities increase the strength of the muscles that support bones. Weight-bearing and muscle-building activities include: Walking and hiking. Jogging and running. Dancing. Gym exercises. Lifting weights. Tennis and racquetball. Climbing stairs. Aerobics. Adults should get at least 30 minutes of moderate physical activity on most days. Children should get at least 60 minutes of moderate physical activity on most days. Ask your health care provider what type of exercise is best for you. How can I find out if my bone mass is low? Bone mass can be measured with an X-ray test called a bone mineral density (BMD) test. This test is recommended for all women who are age 19 or older. It may also be recommended for: Men who are age 55 or older. People who are at risk for osteoporosis because of: Having a long-term disease that weakens bones, such as kidney disease or rheumatoid arthritis. Having menopause earlier than normal. Taking medicine that weakens bones, such as steroids, thyroid hormones, or hormone treatment for breast cancer or prostate cancer. Smoking. Drinking three or more alcoholic drinks a day. Being underweight. Sedentary lifestyle. If you find that you have a low bone mass, you may be able to prevent osteoporosis or further bone loss by changing your diet and lifestyle. Where can I find more information? Bone Health & Osteoporosis Foundation: https://carlson-fletcher.info/ [employer]: www.bones.http://www.myers.net/ International Osteoporosis  Foundation: [employer], operational.iofbonehealth.org Summary The aging process leads to an overall loss of bone mass in the body, which can increase the likelihood of broken bones and osteoporosis. Eating a well-balanced diet with plenty of calcium and vitamin D will help to protect your bones. Weight-bearing and strength-building activities are also important for building and maintaining strong bones. Bone mass can be measured with an X-ray test called a bone mineral density (BMD) test. This information is not intended to replace advice given to you by your health care provider. Make sure you discuss any questions you have with your health care provider. Document Revised: 07/10/2020 Document Reviewed: 07/10/2020 Elsevier Patient Education  2024 [employer].

## 2023-02-19 LAB — TOXASSURE SELECT 13 (MW), URINE

## 2023-03-09 ENCOUNTER — Other Ambulatory Visit: Payer: Self-pay | Admitting: Nurse Practitioner

## 2023-03-09 DIAGNOSIS — Z1231 Encounter for screening mammogram for malignant neoplasm of breast: Secondary | ICD-10-CM

## 2023-03-18 ENCOUNTER — Ambulatory Visit
Admission: RE | Admit: 2023-03-18 | Discharge: 2023-03-18 | Disposition: A | Discharge status: Home / Self Care | Payer: Medicare Other | Source: Ambulatory Visit | Attending: Nurse Practitioner | Admitting: Nurse Practitioner

## 2023-03-18 DIAGNOSIS — Z1231 Encounter for screening mammogram for malignant neoplasm of breast: Secondary | ICD-10-CM

## 2023-03-23 ENCOUNTER — Encounter: Payer: Self-pay | Admitting: Obstetrics and Gynecology

## 2023-04-01 DIAGNOSIS — N3946 Mixed incontinence: Secondary | ICD-10-CM | POA: Insufficient documentation

## 2023-04-01 DIAGNOSIS — N952 Postmenopausal atrophic vaginitis: Secondary | ICD-10-CM | POA: Insufficient documentation

## 2023-04-01 NOTE — Progress Notes (Unsigned)
 New Patient Evaluation and Consultation  Referring Provider: Janean Sark* PCP: Bennie Pierini, FNP Date of Service: 04/02/2023  SUBJECTIVE Chief Complaint: No chief complaint on file.  History of Present Illness: Jane Howard is a 68 y.o. White or Caucasian female seen in consultation at the request of Dr Ardell Isaacs for evaluation of pelvic organ prolapse.    Desires surgical intervention for grade III pelvic organ prolapse Tried vaginal estrogen Using short stemmed 2 1/2 gelllhorn pessary, office management every 3 months since 2022 Failed size 4 & 5 ring, #4 incontinence dish pessaries. S/p TVH, anterior colporrhaphy in 2000 by Dr. Nicholas Lose *** History of recurrent UTI  ***Review of records significant for: ***  Urinary Symptoms: Leaks urine with cough/ sneeze, exercise, with a full bladder, with movement to the bathroom, and with urgency Leaks 1-2 time(s) per days.  Pad use: 1 pads per day.   Patient is bothered by UI symptoms.  Day time voids 7-8.  Nocturia: 1 times per night to void with history of insomnia Voiding dysfunction:  does not empty bladder well.  Patient does not use a catheter to empty bladder.  When urinating, patient feels a weak stream and to push on her belly or vagina to empty bladder Drinks: ***oz per day  UTIs: 2 UTI's in the last year.   {ACTIONS;DENIES/REPORTS:21021675::"Denies"} history of blood in urine, pyelonephritis, bladder cancer, and kidney cancer Reports history of kidney stones *** No results found for the last 90 days.   Pelvic Organ Prolapse Symptoms:                  Patient Admits to a feeling of a bulge the vaginal area. It has been present for {NUMBER 1-10:22536} years.  Patient Admits to seeing a bulge.  This bulge is bothersome.  Bowel Symptom: Bowel movements: 1 time(s) per day with history of consitpation Stool consistency: hard Straining: yes.  Splinting: no.  Incomplete evacuation: yes.   Patient Denies accidental bowel leakage / fecal incontinence Bowel regimen: fiber and probiotics Stool softener Last colonoscopy: Results *** [facility] Colonoscopy          Upcoming     Colonoscopy (Every 10 Years) Next due on 09/26/2029    09/27/2019  [facility] COLONOSCOPY   Only the first 1 history entries have been loaded, but more history exists.                Sexual Function Sexually active: no.  Sexual orientation: Straight Pain with sex: {pain with sex:24762}  Pelvic Pain Denies pelvic pain   Past Medical History:  Past Medical History:  Diagnosis Date   Anxiety    Nephrolithiasis      Past Surgical History:   Past Surgical History:  Procedure Laterality Date   ABDOMINAL HYSTERECTOMY     FOOT SURGERY Bilateral    NASAL SEPTUM SURGERY     TONSILLECTOMY       Past OB/GYN History: OB History  Gravida Para Term Preterm AB Living  2 2      SAB IAB Ectopic Multiple Live Births          # Outcome Date GA Lbr Len/2nd Weight Sex Type Anes PTL Lv  2 Para      Vag-Spont     1 Para      Vag-Spont       Vaginal deliveries: 2,  Forceps/ Vacuum deliveries: ***, Cesarean section: 0 Menopausal: Yes, at age ***, {denies/ admits to:24761} vaginal bleeding since menopause  Contraception: s/p hysterectomy and menopause. Last pap smear was 2016 at [facility].  Any history of abnormal pap smears: no. No results found for: "DIAGPAP", "HPVHIGH", "ADEQPAP"  Medications: Patient has a current medication list which includes the following prescription(s): allopurinol, aspirin ec, atorvastatin, calcium carb-cholecalciferol, vitamin d3, clonazepam, escitalopram, estradiol, fluticasone, hydrochlorothiazide, lisinopril, multiple vitamin, nystatin ointment, probiotic product, and tamsulosin.   Allergies: Patient is allergic to amoxicillin, macrodantin [nitrofurantoin macrocrystal], and vicodin [hydrocodone-acetaminophen].   Social History:  Social History   Tobacco Use    Smoking status: Never   Smokeless tobacco: Never  Vaping Use   Vaping status: Never Used  Substance Use Topics   Alcohol use: Yes    Comment: 1 drink/month   Drug use: No    Relationship status: married Patient lives with her husband and 57 year old grandson.   Patient is not employed. Regular exercise: No History of abuse: No  Family History:   Family History  Problem Relation Age of Onset   Heart disease Mother    Kidney disease Mother    Cancer Father        prostate   Heart disease Father    Kidney disease Father      Review of Systems: Review of Systems  Constitutional:  Positive for malaise/fatigue. Negative for fever and weight loss.       Weight gain  Respiratory:  Positive for shortness of breath. Negative for cough and wheezing.   Cardiovascular:  Positive for leg swelling. Negative for chest pain and palpitations.  Gastrointestinal:  Positive for constipation. Negative for abdominal pain and blood in stool.  Genitourinary:  Positive for frequency and urgency. Negative for dysuria and hematuria.       Leakage  Skin:  Negative for rash.  Neurological:  Negative for dizziness, weakness and headaches.  Endo/Heme/Allergies:  Bruises/bleeds easily.       Hot flashes  Psychiatric/Behavioral:  Negative for depression. The patient is not nervous/anxious.      OBJECTIVE Physical Exam: There were no vitals filed for this visit.  Physical Exam Constitutional:      General: She is not in acute distress.    Appearance: Normal appearance.  Genitourinary:     Bladder and urethral meatus normal.     No lesions in the vagina.     Right Labia: No rash, tenderness, lesions, skin changes or Bartholin's cyst.    Left Labia: No tenderness, lesions, skin changes, Bartholin's cyst or rash.    No vaginal discharge, erythema, tenderness, bleeding, ulceration or granulation tissue.      Right Adnexa: not tender, not full and no mass present.    Left Adnexa: not tender, not  full and no mass present.    Cervix is absent.     Uterus is absent.     Urethral meatus caruncle not present.    No urethral prolapse, tenderness, mass, hypermobility or discharge present.     Bladder is not tender, urgency on palpation not present and masses not present.      Levator ani not tender, obturator internus not tender, no asymmetrical contractions present and no pelvic spasms present.    Anal wink present and BC reflex present. Cardiovascular:     Rate and Rhythm: Normal rate.  Pulmonary:     Effort: Pulmonary effort is normal. No respiratory distress.  Abdominal:     General: There is no distension.     Palpations: There is no mass.     Tenderness: There is  no abdominal tenderness.     Hernia: No hernia is present.  Neurological:     Mental Status: She is alert.  Vitals reviewed. Exam conducted with a chaperone present.      POP-Q:   POP-Q                                               Aa                                               Ba                                                 C                                                Gh                                               Pb                                               tvl                                                Ap                                               Bp                                                 D      Rectal Exam:  Normal sphincter tone, {rectocele:24766} distal rectocele, enterocoele {DESC; PRESENT/NOT PRESENT:21021351}, no rectal masses, {sign of:24767} dyssynergia when asking the patient to bear down.  Post-Void Residual (PVR) by Bladder Scan: In order to evaluate bladder emptying, we discussed obtaining a postvoid residual and patient agreed to this procedure.  Procedure: The ultrasound unit was placed on the patient's abdomen in the suprapubic region after the patient had voided.      Laboratory Results: Lab Results  Component Value Date   COLORU  Yellow 04/25/2020   CLARITYU cloudy 11/17/2013   GLUCOSEUR negative 11/17/2013   BILIRUBINUR Negative 04/25/2020   KETONESU Negative 04/25/2020   SPECGRAV 1.020 04/25/2020   RBCUR moderate 11/17/2013   PHUR 5.5 04/25/2020  PROTEINUR NEGATIVE 04/09/2021   UROBILINOGEN negative 11/17/2013   LEUKOCYTESUR 3+ (A) 04/25/2020    Lab Results  Component Value Date   CREATININE 0.86 02/16/2023   CREATININE 0.79 08/11/2022   CREATININE 0.75 02/10/2022    Lab Results  Component Value Date   HGBA1C 5.6% 02/01/2013    Lab Results  Component Value Date   HGB 14.0 02/16/2023     ASSESSMENT AND PLAN Ms. Hines is a 68 y.o. with: No diagnosis found.  There are no diagnoses linked to this encounter.  Time spent: I spent *** minutes dedicated to the care of this patient on the date of this encounter to include pre-visit review of records, face-to-face time with the patient discussing *** and post visit documentation and ordering medication/ testing.    Loleta Chance, MD

## 2023-04-02 ENCOUNTER — Encounter: Payer: Self-pay | Admitting: Obstetrics

## 2023-04-02 ENCOUNTER — Ambulatory Visit (INDEPENDENT_AMBULATORY_CARE_PROVIDER_SITE_OTHER): Payer: Medicare Other | Admitting: Obstetrics

## 2023-04-02 VITALS — BP 124/79 | HR 79 | Ht 65.0 in | Wt 204.0 lb

## 2023-04-02 DIAGNOSIS — N952 Postmenopausal atrophic vaginitis: Secondary | ICD-10-CM | POA: Diagnosis not present

## 2023-04-02 DIAGNOSIS — E669 Obesity, unspecified: Secondary | ICD-10-CM

## 2023-04-02 DIAGNOSIS — R3914 Feeling of incomplete bladder emptying: Secondary | ICD-10-CM | POA: Insufficient documentation

## 2023-04-02 DIAGNOSIS — K5901 Slow transit constipation: Secondary | ICD-10-CM | POA: Diagnosis not present

## 2023-04-02 DIAGNOSIS — Z960 Presence of urogenital implants: Secondary | ICD-10-CM

## 2023-04-02 DIAGNOSIS — Z9889 Other specified postprocedural states: Secondary | ICD-10-CM

## 2023-04-02 DIAGNOSIS — N8110 Cystocele, unspecified: Secondary | ICD-10-CM | POA: Insufficient documentation

## 2023-04-02 DIAGNOSIS — N3946 Mixed incontinence: Secondary | ICD-10-CM

## 2023-04-02 LAB — POCT URINALYSIS DIPSTICK
Bilirubin, UA: NEGATIVE
Blood, UA: NEGATIVE
Glucose, UA: NEGATIVE
Ketones, UA: 1.025
Leukocytes, UA: NEGATIVE
Nitrite, UA: NEGATIVE
Protein, UA: NEGATIVE
Spec Grav, UA: 1.025 (ref 1.010–1.025)
Urobilinogen, UA: 0.2 U/dL
pH, UA: 5.5 (ref 5.0–8.0)

## 2023-04-02 NOTE — Assessment & Plan Note (Signed)
-   discussed risk of change in urinary or bowel symptoms, vaginal ulceration, discharge, bleeding, fistula formation. Explained that pt may require multiple sizes and types for fitting.  - pt reports discomfort at time of pessary management - encouraged to increase vaginal estrogen with 1g nightly x 2 weeks followed by 2x/week for therapeutic dosing - consider topical lidocaine use to improve tolerance at time of pessary re-fitting - can increase size of pessary

## 2023-04-02 NOTE — Addendum Note (Signed)
 Addended byWyatt Haste T on: 04/02/2023 12:31 PM   Modules accepted: Level of Service

## 2023-04-02 NOTE — Assessment & Plan Note (Signed)
-   uses probiotics and stool softener every 1-2 day - For constipation, we reviewed the importance of a better bowel regimen.  We also discussed the importance of avoiding chronic straining, as it can exacerbate her pelvic floor symptoms; we discussed treating constipation and straining prior to surgery, as postoperative straining can lead to damage to the repair and recurrence of symptoms. We discussed initiating therapy with increasing fluid intake, fiber supplementation, stool softeners, and laxatives such as miralax.  - encouraged to start titration of fiber supplementation - discussed association with pelvic floor disorders - avoid straining and encouraged squatty potty

## 2023-04-02 NOTE — Assessment & Plan Note (Signed)
-   bladder scan , repeat at follow-up - possibly due to outlet obstruction, anterior vaginal wall noted at introitus with pessary in place - encouraged splinting and pessary refitting to improve sensation of incomplete emptying, pt desires to return for pessary fitting

## 2023-04-02 NOTE — Assessment & Plan Note (Addendum)
-   uses short stemmed 2 1/2 gellhorn pessary with anterior vaginal wall at 0 - For treatment of pelvic organ prolapse, we discussed options for management including expectant management, conservative management, and surgical management, such as Kegels, a pessary, pelvic floor physical therapy, and specific surgical procedures. - encouraged patient to consider pessary refitting due to anterior vaginal wall prolapse to reassess urinary symptoms and sensation of incomplete emptying - currently not sexually active due to space occupying pessary in place, reviewed surgical options if she desires to proceed

## 2023-04-02 NOTE — Assessment & Plan Note (Addendum)
 For symptomatic vaginal atrophy options include lubrication with a water-based lubricant, personal hygiene measures and barrier protection against wetness, and estrogen replacement in the form of vaginal cream, vaginal tablets, or a time-released vaginal ring.   - increase dosing to 1g nightly x 2 weeks followed by 2x/week for therapeutic dosing

## 2023-04-02 NOTE — Patient Instructions (Addendum)
 You have a stage 3 (out of 4) prolapse.  We discussed the fact that it is not life threatening but there are several treatment options. For treatment of pelvic organ prolapse, we discussed options for management including expectant management, conservative management, and surgical management, such as Kegels, a pessary, pelvic floor physical therapy, and specific surgical procedures.     We discussed two options for prolapse repair:  1) vaginal repair without mesh - Pros - safer, no mesh complications - Cons - not as strong as mesh repair, higher risk of recurrence  2) laparoscopic repair with mesh - Pros - stronger, better long-term success - Cons - risks of mesh implant (erosion into vagina or bladder, adhering to the rectum, pain) - these risks are lower than with a vaginal mesh but still exist   For vaginal atrophy (thinning of the vaginal tissue that can cause dryness and burning) and UTI prevention we discussed estrogen replacement in the form of vaginal cream.   Increase vaginal estrogen therapy 2 times weekly at night. This can be placed with your finger or an applicator inside the vagina and around the urethra.    Mixed Incontinence (MUI):  MUI includes symptoms of both Overactive bladder (OAB) and stress incontinence (SUI).    Overactive bladder (OAB) causes bladder urgency, frequency, and having to void at night with or without leakage.  Several  treatment options exist, including behavioral changes (avoiding caffeine, etc), physical therapy, medications, and neuromodulation (ways to change the nerve signals to the bladder).   Stress incontinence (SUI) causes urinary leakage with coughing, laughing, sneezing and occasionally during exercise or bending/lifting.  Treatment options include nonsurgical options such as Kegel (pelvic floor) exercises, physical therapy, pessary (vaginal device similar to a diaphragm to prevent leakage) or surgical options such as a midurethral sling.    Constipation: Our goal is to achieve formed bowel movements daily or every-other-day.  You may need to try different combinations of the following options to find what works best for you - everybody's body works differently so feel free to adjust the dosages as needed.  Some options to help maintain bowel health include:  Dietary changes (more leafy greens, vegetables and fruits; less processed foods) Fiber supplementation (Benefiber, FiberCon, Metamucil or Psyllium). Start slow and increase gradually to full dose. Over-the-counter agents such as: stool softeners (Docusate or Colace) and/or laxatives (Miralax, milk of magnesia)  "Power Pudding" is a natural mixture that may help your constipation.  To make blend 1 cup applesauce, 1 cup wheat bran, and 3/4 cup prune juice, refrigerate and then take 1 tablespoon daily with a large glass of water as needed.   Women should try to eat at least 21 to 25 grams of fiber a day, while men should aim for 30 to 38 grams a day. You can add fiber to your diet with food or a fiber supplement such as psyllium (metamucil), benefiber, or fibercon.   Here's a look at how much dietary fiber is found in some common foods. When buying packaged foods, check the Nutrition Facts label for fiber content. It can vary among brands.  Fruits Serving size Total fiber (grams)*  Raspberries 1 cup 8.0  Pear 1 medium 5.5  Apple, with skin 1 medium 4.5  Banana 1 medium 3.0  Orange 1 medium 3.0  Strawberries 1 cup 3.0   Vegetables Serving size Total fiber (grams)*  Green peas, boiled 1 cup 9.0  Broccoli, boiled 1 cup chopped 5.0  Turnip greens, boiled 1 cup  5.0  Brussels sprouts, boiled 1 cup 4.0  Potato, with skin, baked 1 medium 4.0  Sweet corn, boiled 1 cup 3.5  Cauliflower, raw 1 cup chopped 2.0  Carrot, raw 1 medium 1.5   Grains Serving size Total fiber (grams)*  Spaghetti, whole-wheat, cooked 1 cup 6.0  Barley, pearled, cooked 1 cup 6.0  Bran flakes 3/4 cup  5.5  Quinoa, cooked 1 cup 5.0  Oat bran muffin 1 medium 5.0  Oatmeal, instant, cooked 1 cup 5.0  Popcorn, air-popped 3 cups 3.5  Brown rice, cooked 1 cup 3.5  Bread, whole-wheat 1 slice 2.0  Bread, rye 1 slice 2.0   Legumes, nuts and seeds Serving size Total fiber (grams)*  Split peas, boiled 1 cup 16.0  Lentils, boiled 1 cup 15.5  Black beans, boiled 1 cup 15.0  Baked beans, canned 1 cup 10.0  Chia seeds 1 ounce 10.0  Almonds 1 ounce (23 nuts) 3.5  Pistachios 1 ounce (49 nuts) 3.0  Sunflower kernels 1 ounce 3.0  *Rounded to nearest 0.5 gram. Source: [employer] for [employer], [employer] can continue using your pessary. This will keep the bulge inside and prevent it from getting worse. We can do that for you every 3 months if you are unable to remove it.

## 2023-04-02 NOTE — Assessment & Plan Note (Signed)
-   discussed association with urinary incontinence and pelvic organ prolapse - encouraged dietary changes and lifestyle modification - encouraged to review treatment options with PCP - discussed decreased perioperative risks and recurrence risks after surgical intervention

## 2023-04-02 NOTE — Assessment & Plan Note (Signed)
-   POCT UA negative, bladder scan - urgency > stress  - We discussed the symptoms of overactive bladder (OAB), which include urinary urgency, urinary frequency, nocturia, with or without urge incontinence.  While we do not know the exact etiology of OAB, several treatment options exist. We discussed management including behavioral therapy (decreasing bladder irritants, urge suppression strategies, timed voids, bladder retraining), physical therapy, medication; for refractory cases posterior tibial nerve stimulation, sacral neuromodulation, and intravesical botulinum toxin injection.  For anticholinergic medications, we discussed the potential side effects of anticholinergics including dry eyes, dry mouth, constipation, cognitive impairment and urinary retention. For Beta-3 agonist medication, we discussed the potential side effect of elevated blood pressure which is more likely to occur in individuals with uncontrolled hypertension. - declines medications at this time, encouraged fluid management, caffeine reduction - no change with pessary use, reassess with pessary refitting to r/o incomplete emptying - For treatment of stress urinary incontinence,  non-surgical options include expectant management, weight loss, physical therapy, as well as a pessary.  Surgical options include a midurethral sling, Burch urethropexy, and transurethral injection of a bulking agent. - increase vaginal estrogen use to maintenance dosing - encouraged Kegel exercises

## 2023-04-02 NOTE — Assessment & Plan Note (Signed)
-   S/p TVH, anterior colporrhaphy, possible posterior colporrhaphy for pelvic organ prolapse in 2000 by Dr. Nicholas Lose - will need urodynamics if proceed with surgical intervention

## 2023-04-20 ENCOUNTER — Ambulatory Visit (INDEPENDENT_AMBULATORY_CARE_PROVIDER_SITE_OTHER): Payer: No Typology Code available for payment source | Admitting: Obstetrics and Gynecology

## 2023-04-20 ENCOUNTER — Encounter: Payer: Self-pay | Admitting: Obstetrics and Gynecology

## 2023-04-20 VITALS — BP 142/66 | HR 78

## 2023-04-20 DIAGNOSIS — R3914 Feeling of incomplete bladder emptying: Secondary | ICD-10-CM

## 2023-04-20 DIAGNOSIS — N8110 Cystocele, unspecified: Secondary | ICD-10-CM

## 2023-04-20 DIAGNOSIS — N813 Complete uterovaginal prolapse: Secondary | ICD-10-CM | POA: Diagnosis not present

## 2023-04-20 NOTE — Progress Notes (Signed)
 [facility]   Subjective:     Chief Complaint: Pessary fitting  History of Present Illness: Jane Howard is a 68 y.o. female with stage III pelvic organ prolapse who presents today for a pessary fitting.    Past Medical History: Patient  has a past medical history of Anxiety and Nephrolithiasis.   Past Surgical History: She  has a past surgical history that includes Tonsillectomy; Foot surgery (Bilateral); Abdominal hysterectomy; and Nasal septum surgery.   Medications: She has a current medication list which includes the following prescription(s): allopurinol, aspirin ec, atorvastatin, calcium carb-cholecalciferol, vitamin d3, clonazepam, escitalopram, estradiol, fluticasone, hydrochlorothiazide, lisinopril, multiple vitamin, multiple vitamins-minerals, probiotic product, psyllium, and tamsulosin.   Allergies: Patient is allergic to amoxicillin, macrodantin [nitrofurantoin macrocrystal], and vicodin [hydrocodone-acetaminophen].   Social History: Patient  reports that she has never smoked. She has never used smokeless tobacco. She reports current alcohol use. She reports that she does not use drugs.      Objective:    BP (!) 142/66   Pulse 78  Gen: No apparent distress, A&O x 3. Pelvic Exam: Normal external female genitalia; Bartholin's and Skene's glands normal in appearance; urethral meatus normal in appearance, no urethral masses or discharge.   Attempted a #2 Donut pessary which was expelled with Valsalva Attempted a #3 Donut pessary which was not as supportive as the previous 2 1/4in Gellhorn  A size 5 (2 3/4in)  long stem gellhorn pessary was fitted. It was comfortable, stayed in place with valsalva and was an appropriate size on examination, with one finger fitting between the pessary and the vaginal walls. Patient was able to void and attempt BM without pessary expulsion and anterior wall seemed better supported than previous Gellhorn. Will plan for  Cath PVR at next pessary check.   Assessment/Plan:    Assessment: Jane Howard is a 68 y.o. with stage III pelvic organ prolapse who presents for a pessary fitting. Plan: She was fitted with a 2 3/4in long stem gellhorn pessary. She will keep the pessary in place until next visit. She will use estrogen.   Follow-up in 3 months for a pessary check or sooner as needed. Will do a catheterized PVR at next pessary check to evaluate bladder emptying. If she is still not emptying will consider Urodynamics.   All questions were answered.    Selmer Dominion, NP

## 2023-05-06 ENCOUNTER — Encounter: Payer: Medicare Other | Admitting: Obstetrics and Gynecology

## 2023-06-14 ENCOUNTER — Ambulatory Visit: Payer: Self-pay

## 2023-06-14 NOTE — Telephone Encounter (Signed)
 Copied from CRM [id number]. Topic: Clinical - Red Word Triage >> Jun 14, 2023  3:31 PM Carla L wrote: Red Word that prompted transfer to Nurse Triage: burning while urinating, pressure, having to go to the bathroom frequently, possible UTI   Chief Complaint: Urinary Symptoms  Symptoms: Frequency, Urgency, Dysuria  Frequency: Started Saturday  Pertinent Negatives: Patient denies flank pain, fever  Disposition: [] ED /[] Urgent Care (no appt availability in office) / [x] Appointment(In office/virtual)/ []  [facility]/ [] Home Care/ [] Refused Recommended Disposition /[] [facility]/ []  Follow-up with PCP Additional Notes: JK is being triaged for acute symptoms involving frequency, urgency, and discomfort. In office appointment made for tomorrow morning.   Reason for Disposition  Urinating more frequently than usual (i.e., frequency)  Answer Assessment - Initial Assessment Questions 1. SYMPTOM: "What's the main symptom you're concerned about?" (e.g., frequency, incontinence)     Frequency, Urgency, Dysuria, Darker Urine  2. ONSET: "When did the  symptoms  start?"     Saturday  3. PAIN: "Is there any pain?" If Yes, ask: "How bad is it?" (Scale: 1-10; mild, moderate, severe)     Pressure  4. CAUSE: "What do you think is causing the symptoms?"     Infection  5. OTHER SYMPTOMS: "Do you have any other symptoms?" (e.g., blood in urine, fever, flank pain, pain with urination)     Pain with urination  6. PREGNANCY: "Is there any chance you are pregnant?" "When was your last menstrual period?"     No and No  Protocols used: Urinary Symptoms-A-AH

## 2023-06-15 ENCOUNTER — Ambulatory Visit (INDEPENDENT_AMBULATORY_CARE_PROVIDER_SITE_OTHER): Admitting: Nurse Practitioner

## 2023-06-15 ENCOUNTER — Encounter: Payer: Self-pay | Admitting: Nurse Practitioner

## 2023-06-15 VITALS — BP 118/73 | HR 81 | Temp 97.8°F | Ht 65.0 in | Wt 209.0 lb

## 2023-06-15 DIAGNOSIS — R300 Dysuria: Secondary | ICD-10-CM

## 2023-06-15 DIAGNOSIS — N3090 Cystitis, unspecified without hematuria: Secondary | ICD-10-CM

## 2023-06-15 LAB — URINALYSIS, ROUTINE W REFLEX MICROSCOPIC
Bilirubin, UA: NEGATIVE
Glucose, UA: NEGATIVE
Ketones, UA: NEGATIVE
Nitrite, UA: POSITIVE — AB
Protein,UA: NEGATIVE
Specific Gravity, UA: 1.02 (ref 1.005–1.030)
Urobilinogen, Ur: 0.2 mg/dL (ref 0.2–1.0)
pH, UA: 5.5 (ref 5.0–7.5)

## 2023-06-15 LAB — MICROSCOPIC EXAMINATION
Renal Epithel, UA: NONE SEEN /HPF
WBC, UA: >30 /HPF — AB (ref 0–5)

## 2023-06-15 MED ORDER — SULFAMETHOXAZOLE-TRIMETHOPRIM 800-160 MG PO TABS
1.0000 | ORAL_TABLET | Freq: Two times a day (BID) | ORAL | 0 refills | Status: DC
Start: 2023-06-15 — End: 2023-08-17

## 2023-06-15 NOTE — Progress Notes (Signed)
   Subjective:    Patient ID: Jane Howard, female    DOB: Jan 06, 1956, 68 y.o.   MRN: [id number]   Chief Complaint: Dysuria (Since Saturday)   Dysuria  This is a new problem. The current episode started in the past 7 days. The problem occurs every urination. The problem has been waxing and waning. The quality of the pain is described as burning. The pain is at a severity of 5/10. The pain is moderate. There has been no fever. She is Not sexually active. There is No history of pyelonephritis. Associated symptoms include frequency, hesitancy and urgency. Pertinent negatives include no chills. She has tried nothing for the symptoms. The treatment provided no relief.    Patient Active Problem List   Diagnosis Date Noted   Feeling of incomplete bladder emptying 04/02/2023   Pelvic organ prolapse quantification stage 3 cystocele 04/02/2023   History of pelvic surgery 04/02/2023   Presence of pessary 04/02/2023   Urinary incontinence, mixed 04/01/2023   Vaginal atrophy 04/01/2023   Constipation 04/16/2022   Candidiasis of skin 01/31/2022   Family history of malignant neoplasm of digestive organs 01/31/2022   Primary insomnia 02/06/2021   Gastroesophageal reflux disease without esophagitis 02/06/2020   Vitamin D  deficiency 12/01/2016   Hyperlipidemia 11/27/2016   Obesity (BMI 30-39.9) 11/06/2015   Gout 11/17/2013   Essential hypertension, benign 11/17/2013   GAD (generalized anxiety disorder) 11/17/2013       Review of Systems  Constitutional:  Negative for chills and fever.  Genitourinary:  Positive for dysuria, frequency, hesitancy and urgency.       Objective:   Physical Exam Constitutional:      Appearance: Normal appearance. She is obese.  Cardiovascular:     Rate and Rhythm: Normal rate and regular rhythm.     Heart sounds: Normal heart sounds.  Pulmonary:     Effort: Pulmonary effort is normal.     Breath sounds: Normal breath sounds.  Skin:    General: Skin is  warm.  Neurological:     General: No focal deficit present.     Mental Status: She is alert and oriented to person, place, and time.  Psychiatric:        Mood and Affect: Mood normal.        Behavior: Behavior normal.    BP 118/73   Pulse 81   Temp 97.8 F (36.6 C) (Temporal)   Ht 5\' 5"  (1.651 m)   Wt 209 lb (94.8 kg)   SpO2 98%   BMI 34.78 kg/m         Assessment & Plan:   Jane Howard in today with chief complaint of Dysuria (Since Saturday)   1. Dysuria (Primary) - Urinalysis, Routine w reflex microscopic - Urine Culture  2. Cystitis Take medication as prescribe Cotton underwear Take shower not bath Cranberry juice, yogurt Force fluids AZO over the counter X2 days Culture pending RTO prn  - sulfamethoxazole -trimethoprim  (BACTRIM  DS) 800-160 MG tablet; Take 1 tablet by mouth 2 (two) times daily.  Dispense: 20 tablet; Refill: 0    The above assessment and management plan was discussed with the patient. The patient verbalized understanding of and has agreed to the management plan. Patient is aware to call the clinic if symptoms persist or worsen. Patient is aware when to return to the clinic for a follow-up visit. Patient educated on when it is appropriate to go to the emergency department.   Mary-Margaret Gaylyn Keas, FNP

## 2023-06-15 NOTE — Patient Instructions (Addendum)
 Take medication as prescribe Cotton underwear Take shower not bath Cranberry juice, yogurt Force fluids AZO over the counter X2 days Culture pending RTO prn

## 2023-06-19 LAB — URINE CULTURE

## 2023-06-21 DIAGNOSIS — Z0289 Encounter for other administrative examinations: Secondary | ICD-10-CM

## 2023-06-22 ENCOUNTER — Encounter: Payer: Self-pay | Admitting: Nurse Practitioner

## 2023-06-22 ENCOUNTER — Ambulatory Visit (INDEPENDENT_AMBULATORY_CARE_PROVIDER_SITE_OTHER): Admitting: Nurse Practitioner

## 2023-06-22 VITALS — BP 127/71 | HR 82 | Temp 98.0°F | Ht 65.0 in | Wt 203.0 lb

## 2023-06-22 DIAGNOSIS — E66811 Obesity, class 1: Secondary | ICD-10-CM

## 2023-06-22 DIAGNOSIS — E6609 Other obesity due to excess calories: Secondary | ICD-10-CM | POA: Diagnosis not present

## 2023-06-22 DIAGNOSIS — E782 Mixed hyperlipidemia: Secondary | ICD-10-CM | POA: Diagnosis not present

## 2023-06-22 DIAGNOSIS — I10 Essential (primary) hypertension: Secondary | ICD-10-CM | POA: Diagnosis not present

## 2023-06-22 DIAGNOSIS — Z6833 Body mass index (BMI) 33.0-33.9, adult: Secondary | ICD-10-CM

## 2023-06-22 NOTE — Progress Notes (Signed)
 Office: [phone]  /  Fax: [phone]   Initial Visit  Jane Howard was seen in clinic today to evaluate for obesity. She is interested in losing weight to improve overall health and reduce the risk of weight related complications. She presents today to review program treatment options, initial physical assessment, and evaluation.     She was referred by: Friend or Family  When asked what else they would like to accomplish? She states: Adopt healthier eating patterns, Improve energy levels and physical activity, Improve existing medical conditions, Reduce number of medications, Improve quality of life, Improve appearance, and Improve self-confidence  When asked how has your weight affected you? She states: Contributed to medical problems, Contributed to orthopedic problems or mobility issues, Having fatigue, and Having poor endurance  Some associated conditions: HTN, GERD, GAD, gout, HLD, Vit D def  Contributing factors: Family history of obesity, Moderate to high levels of stress, and Menopause  Weight promoting medications identified: None  Current nutrition plan: None  Current level of physical activity: None  Current or previous pharmacotherapy: Phentermine  Response to medication: Lost weight initially but was unable to sustain weight loss   Past medical history includes:   Past Medical History:  Diagnosis Date   Anxiety    Nephrolithiasis      Objective:   BP 127/71   Pulse 82   Temp 98 F (36.7 C)   Ht 5\' 5"  (1.651 m)   Wt 203 lb (92.1 kg)   SpO2 98%   BMI 33.78 kg/m  She was weighed on the bioimpedance scale: Body mass index is 33.78 kg/m.  Peak Weight:209 lbs , Body Fat%:44.5%, Visceral Fat Rating:13, Weight trend over the last 12 months: Increasing  General:  Alert, oriented and cooperative. Patient is in no acute distress.  Respiratory: Normal respiratory effort, no problems with respiration noted   Gait: able to ambulate independently  Mental  Status: Normal mood and affect. Normal behavior. Normal judgment and thought content.   DIAGNOSTIC DATA REVIEWED:  BMET    Component Value Date/Time   NA 141 02/16/2023 1035   K 4.8 02/16/2023 1035   CL 105 02/16/2023 1035   CO2 23 02/16/2023 1035   GLUCOSE 114 (H) 02/16/2023 1035   GLUCOSE 114 (H) 11/16/2017 1608   BUN 22 02/16/2023 1035   CREATININE 0.86 02/16/2023 1035   CALCIUM  9.4 02/16/2023 1035   GFRNONAA 59 (L) 08/04/2019 1428   GFRAA 68 08/04/2019 1428   Lab Results  Component Value Date   HGBA1C 5.6% 02/01/2013   No results found for: "INSULIN" CBC    Component Value Date/Time   WBC 6.2 02/16/2023 1035   WBC 20.1 (H) 11/16/2017 1608   RBC 4.79 02/16/2023 1035   RBC 4.68 11/16/2017 1608   HGB 14.0 02/16/2023 1035   HCT 43.0 02/16/2023 1035   PLT 247 02/16/2023 1035   MCV 90 02/16/2023 1035   MCH 29.2 02/16/2023 1035   MCH 29.3 11/16/2017 1608   MCHC 32.6 02/16/2023 1035   MCHC 32.9 11/16/2017 1608   RDW 12.1 02/16/2023 1035   Iron/TIBC/Ferritin/ %Sat    Component Value Date/Time   IRON 51 11/27/2016 1709   TIBC 304 11/27/2016 1709   FERRITIN 299 (H) 11/27/2016 1709   IRONPCTSAT 17 11/27/2016 1709   Lipid Panel     Component Value Date/Time   CHOL 185 02/16/2023 1035   TRIG 131 02/16/2023 1035   TRIG 354 (H) 11/17/2013 1657   HDL 54 02/16/2023 1035  HDL 41 11/17/2013 1657   CHOLHDL 3.4 02/16/2023 1035   LDLCALC 108 (H) 02/16/2023 1035   LDLCALC 109 (H) 11/17/2013 1657   Hepatic Function Panel     Component Value Date/Time   PROT 6.6 02/16/2023 1035   ALBUMIN 4.1 02/16/2023 1035   AST 17 02/16/2023 1035   ALT 19 02/16/2023 1035   ALKPHOS 84 02/16/2023 1035   BILITOT 0.4 02/16/2023 1035      Component Value Date/Time   TSH 1.070 11/27/2016 1709     Assessment and Plan:   Essential hypertension, benign Continue to follow up with PCP.  Continue meds as directed  Mixed hyperlipidemia Continue to follow up with PCP.  Continue meds  as directed  Class 1 obesity due to excess calories with serious comorbidity and body mass index (BMI) of 33.0 to 33.9 in adult        Obesity Treatment / Action Plan:  Patient will work on garnering support from family and friends to begin weight loss journey. Will work on eliminating or reducing the presence of highly palatable, calorie dense foods in the home. Will complete provided nutritional and psychosocial assessment questionnaire before the next appointment. Will be scheduled for indirect calorimetry to determine resting energy expenditure in a fasting state.  This will allow us  to create a reduced calorie, high-protein meal plan to promote loss of fat mass while preserving muscle mass. Counseled on the health benefits of losing 5%-15% of total body weight. Was counseled on nutritional approaches to weight loss and benefits of reducing processed foods and consuming plant-based foods and high quality protein as part of nutritional weight management. Was counseled on pharmacotherapy and role as an adjunct in weight management.   Obesity Education Performed Today:  She was weighed on the bioimpedance scale and results were discussed and documented in the synopsis.  We discussed obesity as a disease and the importance of a more detailed evaluation of all the factors contributing to the disease.  We discussed the importance of long term lifestyle changes which include nutrition, exercise and behavioral modifications as well as the importance of customizing this to her specific health and social needs.  We discussed the benefits of reaching a healthier weight to alleviate the symptoms of existing conditions and reduce the risks of the biomechanical, metabolic and psychological effects of obesity.  Jane Howard appears to be in the action stage of change and states they are ready to start intensive lifestyle modifications and behavioral modifications.  30 minutes was spent today on  this visit including the above counseling, pre-visit chart review, and post-visit documentation.  Reviewed by clinician on day of visit: allergies, medications, problem list, medical history, surgical history, family history, social history, and previous encounter notes pertinent to obesity diagnosis.    Crist Dominion Consuela Widener FNP-C

## 2023-07-22 ENCOUNTER — Encounter: Payer: Self-pay | Admitting: Obstetrics and Gynecology

## 2023-07-22 ENCOUNTER — Ambulatory Visit: Admitting: Obstetrics and Gynecology

## 2023-07-22 VITALS — BP 139/84

## 2023-07-22 DIAGNOSIS — N8110 Cystocele, unspecified: Secondary | ICD-10-CM

## 2023-07-22 DIAGNOSIS — R3914 Feeling of incomplete bladder emptying: Secondary | ICD-10-CM

## 2023-07-22 DIAGNOSIS — N3946 Mixed incontinence: Secondary | ICD-10-CM

## 2023-07-22 DIAGNOSIS — N813 Complete uterovaginal prolapse: Secondary | ICD-10-CM

## 2023-07-22 NOTE — Progress Notes (Signed)
  [facility]   Subjective:     Chief Complaint:  Chief Complaint  Patient presents with   Follow-up    Jane Howard is a 68 y.o. female here today for pessary check and PVR   History of Present Illness: Jane Howard is a 68 y.o. female with stage III pelvic organ prolapse who presents for a pessary check. She is using a size #5  long stem gellhorn pessary. The pessary has been working well and she has no complaints. She is using vaginal estrogen. She denies vaginal bleeding.  Past Medical History: Patient  has a past medical history of Anxiety and Nephrolithiasis.   Past Surgical History: She  has a past surgical history that includes Tonsillectomy; Foot surgery (Bilateral); Abdominal hysterectomy; and Nasal septum surgery.   Medications: She has a current medication list which includes the following prescription(s): allopurinol , aspirin ec, atorvastatin , calcium  carb-cholecalciferol, vitamin d3, clonazepam , escitalopram , estradiol , fluticasone , hydrochlorothiazide , lisinopril , multiple vitamin, multiple vitamins-minerals, probiotic product, psyllium, sulfamethoxazole -trimethoprim , and tamsulosin .   Allergies: Patient is allergic to amoxicillin , macrodantin [nitrofurantoin macrocrystal], and vicodin [hydrocodone -acetaminophen ].   Social History: Patient  reports that she has never smoked. She has never used smokeless tobacco. She reports current alcohol use. She reports that she does not use drugs.      Objective:     Post Void Residual - 07/22/23 1112       Post Void Residual   Post Void Residual 139 mL          Physical Exam: BP 139/84  Gen: No apparent distress, A&O x 3. Detailed Urogynecologic Evaluation:  Pelvic Exam: Normal external female genitalia; Bartholin's and Skene's glands normal in appearance; urethral meatus normal in appearance, no urethral masses or discharge. The pessary was noted to be in place. It was removed and cleaned.  Speculum exam revealed no lesions in the vagina. The pessary was replaced. It was comfortable to the patient and fit well.    Assessment/Plan:    Assessment: Jane Howard is a 68 y.o. with stage III pelvic organ prolapse here for a pessary check. She is doing well.  Plan: She will keep the pessary in place until next visit. She will continue to use estrogen. She will follow-up for Urodynamics.   Patient reports she is still considering surgical intervention. Will plan to do urodynamics to assess for occult SUI and then have her follow up with Jane Howard.  All questions were answered.

## 2023-07-29 ENCOUNTER — Encounter: Payer: Self-pay | Admitting: Family Medicine

## 2023-07-29 ENCOUNTER — Ambulatory Visit (INDEPENDENT_AMBULATORY_CARE_PROVIDER_SITE_OTHER): Admitting: Family Medicine

## 2023-07-29 VITALS — BP 139/79 | HR 84 | Temp 98.6°F | Ht 65.0 in | Wt 200.0 lb

## 2023-07-29 DIAGNOSIS — R0602 Shortness of breath: Secondary | ICD-10-CM | POA: Diagnosis not present

## 2023-07-29 DIAGNOSIS — F411 Generalized anxiety disorder: Secondary | ICD-10-CM

## 2023-07-29 DIAGNOSIS — E785 Hyperlipidemia, unspecified: Secondary | ICD-10-CM

## 2023-07-29 DIAGNOSIS — M8580 Other specified disorders of bone density and structure, unspecified site: Secondary | ICD-10-CM

## 2023-07-29 DIAGNOSIS — R5383 Other fatigue: Secondary | ICD-10-CM

## 2023-07-29 DIAGNOSIS — R7309 Other abnormal glucose: Secondary | ICD-10-CM

## 2023-07-29 DIAGNOSIS — I10 Essential (primary) hypertension: Secondary | ICD-10-CM | POA: Diagnosis not present

## 2023-07-29 DIAGNOSIS — E66811 Obesity, class 1: Secondary | ICD-10-CM

## 2023-07-29 DIAGNOSIS — E782 Mixed hyperlipidemia: Secondary | ICD-10-CM

## 2023-07-29 DIAGNOSIS — Z6833 Body mass index (BMI) 33.0-33.9, adult: Secondary | ICD-10-CM

## 2023-07-29 DIAGNOSIS — E6609 Other obesity due to excess calories: Secondary | ICD-10-CM

## 2023-07-29 NOTE — Progress Notes (Signed)
 At a Glance:  Vitals Temp: 98.6 F (37 C) BP: 139/79 Pulse Rate: 84 SpO2: 95 %   Anthropometric Measurements Height: 5' 5 (1.651 m) Weight: 200 lb (90.7 kg) BMI (Calculated): 33.28 Starting Weight: 200lb Peak Weight: 209lb   Body Composition  Body Fat %: 44.8 % Fat Mass (lbs): 90 lbs Muscle Mass (lbs): 105.2 lbs Total Body Water (lbs): 74.2 lbs Visceral Fat Rating : 13   Other Clinical Data RMR: 1656 Fasting: Yes Labs: Yes Today's Visit #: 1 Starting Date: 07/29/23    EKG: Normal sinus rhythm, rate 84. LBBB (unchnaged from EKG 11/16/2017)   Indirect Calorimeter completed today shows a VO2 of 241 and a REE of 1656.  Her calculated basal metabolic rate is 1610 thus her basal metabolic rate is better than expected.  Chief Complaint:  Obesity   Subjective:  Jane Howard (MR# [id number]) is a 68 y.o. female who presents for evaluation and treatment of obesity and related comorbidities.   Jane Howard is currently in the action stage of change and ready to dedicate time achieving and maintaining a healthier weight. Jane Howard is interested in becoming our patient and working on intensive lifestyle modifications including (but not limited to) diet and exercise for weight loss.  Jane Howard has been struggling with her weight. She has been unsuccessful in either losing weight, maintaining weight loss, or reaching her healthy weight goal.  She is retired.  She lives with her husband, son and 42 year old grandson.  Stress levels are high and she had a long history of stress and boredom eating.  She gained weight after her second child was born and again with menopause.  She would like to lose about 40 pounds.  She has a poor support system at home.  She has been craving more sweets especially at night.  Finley's habits were reviewed today and are as follows: Her family eats meals together, her desired weight loss is 40-50 lb, she started gaining weight after pregnancy and  menopause, she has significant food cravings issues, she snacks frequently in the evenings, she skips meals frequently, she is frequently drinking liquids with calories, and she struggles with emotional eating.   Other Fatigue Jane Howard admits to daytime somnolence and admits to waking up still tired. Patient has a history of symptoms of morning fatigue. Jane Howard generally gets 5 or 6 hours of sleep per night, and states that she has difficulty falling asleep. Snoring is present. Apneic episodes are not present. Epworth Sleepiness Score is 14.   Shortness of Breath Karley notes increasing shortness of breath with exercising and seems to be worsening over time with weight gain. She notes getting out of breath sooner with activity than she used to. This has gotten worse recently. Jane Howard denies shortness of breath at rest or orthopnea.   Depression Screen Jane Howard's Food and Mood (modified PHQ-9) score was 22.     06/15/2023    8:59 AM  Depression screen PHQ 2/9  Decreased Interest 0  Down, Depressed, Hopeless 0  PHQ - 2 Score 0     Assessment and Plan:   Other Fatigue Jane Howard does feel that her weight is causing her energy to be lower than it should be. Fatigue may be related to obesity, depression or many other causes. Labs will be ordered, and in the meanwhile, Jane Howard will focus on self care including making healthy food choices, increasing physical activity and focusing on stress reduction.  Shortness of Breath Jane Howard does feel that she gets out of breath  more easily that she used to when she exercises. Jane Howard's shortness of breath appears to be obesity related and exercise induced. She has agreed to work on weight loss and gradually increase exercise to treat her exercise induced shortness of breath. Will continue to monitor closely.  Jane Howard had a positive depression screening. Depression is commonly associated with obesity and often results in emotional eating behaviors.  We will monitor this closely and work on CBT to help improve the non-hunger eating patterns. Referral to Psychology may be required if no improvement is seen as she continues in our clinic.    Problem List Items Addressed This Visit     Essential hypertension, benign Blood pressure is controlled on HCTZ 50 mg 1 daily, lisinopril  40 mg once daily. She denies chest pain, leg edema or headaches.  Continue current antihypertensive occasions look for improvements with weight loss.    GAD (generalized anxiety disorder) She reports high levels of anxiety with emotional eating.  She lacks a good support system at home.  She is on Lexapro  10 mg once daily and Klonopin  0.5 mg twice daily as needed anxiety.  She reports poor sleep at night  Continue current medication regimen.  Look for improvements with stress reduction and improving sleep, good nutrition and regular exercise.  Consider the addition of counseling.    Hyperlipidemia Lab Results  Component Value Date   CHOL 185 02/16/2023   HDL 54 02/16/2023   LDLCALC 108 (H) 02/16/2023   TRIG 131 02/16/2023   CHOLHDL 3.4 02/16/2023  She is on atorvastatin  20 mg once daily and denies issues with myalgias or other side effects. LDL goal less than 100.  Begin prescribed diet which is low in saturated fat and look for improvements over the next 6 months.   Other Visit Diagnoses       SOBOE (shortness of breath on exertion)    -  Primary     Other fatigue       Relevant Orders   EKG 12-Lead (Completed)   TSH   T4, free   T3   Insulin, random   Folate   Vitamin B12     Osteopenia, unspecified location     She has a history of osteopenia currently on calcium  carbonate with vitamin D  1 tab daily and vitamin D  5000 IU once daily.  Continue current supplements.  Vitamin D  level not covered by insurance at this point in time.  Follow-up DEXA scan per PCP every 2 years.      Elevated glucose     She is at high risk for type 2 diabetes with a BMI of  33, high sugar intake, a body fat percentage 44.8 and a visceral fat rating of 13.   Relevant Orders   Hemoglobin A1c     Class 1 obesity due to excess calories with serious comorbidity and body mass index (BMI) of 33.0 to 33.9 in adult           Jane Howard is currently in the action stage of change and her goal is to continue with weight loss efforts. I recommend Jane Howard begin the structured treatment plan as follows:  She has agreed to Category 2 Plan  Exercise goals: Older adults should follow the adult guidelines. When older adults cannot meet the adult guidelines, they should be as physically active as their abilities and conditions will allow.  Behavioral modification strategies:increasing lean protein intake, increase H2O intake, decrease liquid calories, decrease ETOH, increase high fiber foods, decreasing eating  out, no skipping meals, meal planning and cooking strategies, keeping healthy foods in the home, better snacking choices, avoiding temptations, and planning for success  She was informed of the importance of frequent follow-up visits to maximize her success with intensive lifestyle modifications for her multiple health conditions. She was informed we would discuss her lab results at her next visit unless there is a critical issue that needs to be addressed sooner. Jane Howard agreed to keep her next visit at the agreed upon time to discuss these results.  Objective:  General: Cooperative, alert, well developed, in no acute distress. HEENT: Conjunctivae and lids unremarkable. Cardiovascular: Regular rhythm.  Lungs: Normal work of breathing. Neurologic: No focal deficits.   Lab Results  Component Value Date   CREATININE 0.86 02/16/2023   BUN 22 02/16/2023   NA 141 02/16/2023   K 4.8 02/16/2023   CL 105 02/16/2023   CO2 23 02/16/2023   Lab Results  Component Value Date   ALT 19 02/16/2023   AST 17 02/16/2023   ALKPHOS 84 02/16/2023   BILITOT 0.4 02/16/2023   Lab  Results  Component Value Date   HGBA1C 5.6% 02/01/2013   No results found for: INSULIN Lab Results  Component Value Date   TSH 1.070 11/27/2016   Lab Results  Component Value Date   CHOL 185 02/16/2023   HDL 54 02/16/2023   LDLCALC 108 (H) 02/16/2023   TRIG 131 02/16/2023   CHOLHDL 3.4 02/16/2023   Lab Results  Component Value Date   WBC 6.2 02/16/2023   HGB 14.0 02/16/2023   HCT 43.0 02/16/2023   MCV 90 02/16/2023   PLT 247 02/16/2023   Lab Results  Component Value Date   IRON 51 11/27/2016   TIBC 304 11/27/2016   FERRITIN 299 (H) 11/27/2016    Attestation Statements:  Reviewed by clinician on day of visit: allergies, medications, problem list, medical history, surgical history, family history, social history, and previous encounter notes.  Time spent on visit including pre-visit chart review and post-visit charting and face- to face care including nutritional counseling, review of EKG, interpretation of body composition scale and indirect calorimetry and nutrition prescription  was 46 minutes.   Micky Albee, D.O. DABFM, DABOM [facility] [address] [place], [place] [address] [phone]

## 2023-07-30 LAB — HEMOGLOBIN A1C
Est. average glucose Bld gHb Est-mCnc: 117 mg/dL
Hgb A1c MFr Bld: 5.7 % — ABNORMAL HIGH (ref 4.8–5.6)

## 2023-07-30 LAB — T4, FREE: Free T4: 1.21 ng/dL (ref 0.82–1.77)

## 2023-07-30 LAB — INSULIN, RANDOM: INSULIN: 17 u[IU]/mL (ref 2.6–24.9)

## 2023-07-30 LAB — TSH: TSH: 0.933 u[IU]/mL (ref 0.450–4.500)

## 2023-07-30 LAB — T3: T3, Total: 128 ng/dL (ref 71–180)

## 2023-07-30 LAB — FOLATE: Folate: >20 ng/mL (ref >3.0)

## 2023-07-30 LAB — VITAMIN B12: Vitamin B-12: 709 pg/mL (ref 232–1245)

## 2023-08-02 ENCOUNTER — Ambulatory Visit: Payer: Self-pay | Admitting: Family Medicine

## 2023-08-10 ENCOUNTER — Encounter: Payer: Self-pay | Admitting: Obstetrics and Gynecology

## 2023-08-10 ENCOUNTER — Ambulatory Visit (INDEPENDENT_AMBULATORY_CARE_PROVIDER_SITE_OTHER): Admitting: Obstetrics and Gynecology

## 2023-08-10 VITALS — BP 110/72 | HR 78

## 2023-08-10 DIAGNOSIS — R3914 Feeling of incomplete bladder emptying: Secondary | ICD-10-CM

## 2023-08-10 DIAGNOSIS — N393 Stress incontinence (female) (male): Secondary | ICD-10-CM | POA: Diagnosis not present

## 2023-08-10 DIAGNOSIS — R948 Abnormal results of function studies of other organs and systems: Secondary | ICD-10-CM

## 2023-08-10 DIAGNOSIS — N8110 Cystocele, unspecified: Secondary | ICD-10-CM

## 2023-08-10 DIAGNOSIS — R350 Frequency of micturition: Secondary | ICD-10-CM

## 2023-08-10 DIAGNOSIS — N3946 Mixed incontinence: Secondary | ICD-10-CM

## 2023-08-10 LAB — POCT URINALYSIS DIPSTICK
Bilirubin, UA: NEGATIVE
Blood, UA: NEGATIVE
Glucose, UA: NEGATIVE
Ketones, UA: NEGATIVE
Leukocytes, UA: NEGATIVE
Nitrite, UA: NEGATIVE
Protein, UA: NEGATIVE
Spec Grav, UA: 1.02 (ref 1.010–1.025)
Urobilinogen, UA: 0.2 U/dL
pH, UA: 6 (ref 5.0–8.0)

## 2023-08-10 NOTE — Progress Notes (Signed)
 [facility] Urodynamics Procedure  Referring Physician: Gladis Mustard, * Date of Procedure: 08/10/2023  Jane Howard is a 68 y.o. female who presents for urodynamic evaluation. Indication(s) for study: mixed incontinence  Vital Signs: There were no vitals taken for this visit.  Laboratory Results: A catheterized urine specimen revealed:  POC urine:  Lab Results  Component Value Date   COLORU Yellow 08/10/2023   CLARITYU Clear 08/10/2023   GLUCOSEUR Negative 08/10/2023   BILIRUBINUR Negative 08/10/2023   KETONESU Negative 08/10/2023   SPECGRAV 1.020 08/10/2023   RBCUR Negative 08/10/2023   PHUR 6.0 08/10/2023   PROTEINUR Negative 08/10/2023   UROBILINOGEN 0.2 08/10/2023   LEUKOCYTESUR Negative 08/10/2023     Voiding Diary: Deferred  Procedure Timeout:  The correct patient was verified and the correct procedure was verified. The patient was in the correct position and safety precautions were reviewed based on at the patient's history.  Urodynamic Procedure A 49F dual lumen urodynamics catheter was placed under sterile conditions into the patient's bladder. A 49F catheter was placed into the rectum in order to measure abdominal pressure. EMG patches were placed in the appropriate position.  All connections were confirmed and calibrations/adjusted made. Saline was instilled into the bladder through the dual lumen catheters.  Cough/valsalva pressures were measured periodically during filling.  Patient was allowed to void.  The bladder was then emptied of its residual.  UROFLOW: Pessary removed prior to uroflow  Revealed a Qmax of 16 mL/sec.  She voided 124 mL and had a residual of 15 mL.  It was a interrupted pattern and represented normal habits though interpretation limited due to low voided volume.  CMG: This was performed with sterile water in the sitting position at a fill rate of 30 mL/min.    First sensation of fullness was 98 mLs,  First urge  was 152 mLs,  Strong urge was 287 mLs and  Capacity was 346 mLs  Stress incontinence was demonstrated Highest positive Barrier CLPP was 47 cmH20 at 200 ml. (Large loss of urine, approximately 30ccs with cough)  Highest negative Barrier VLPP was 28 cmH20 at 200 ml.  Detrusor function was normal, with no phasic contractions seen.    Compliance:  Normal. End fill detrusor pressure was 0cmH20.    UPP: MUCP with barrier reduction was 29 cm of water.    MICTURITION STUDY: Voiding was performed with reduction using scopettes in the sitting position.  Pdet at Qmax was 23 cm of water.  Qmax was 13 mL/sec.  It was a intermittent pattern.  She voided 146 mL and had a residual of 200 mL.  It was a volitional void, sustained detrusor contraction was present and abdominal straining was not present  EMG: This was performed with patches.  She had voluntary contractions, recruitment with fill was present and urethral sphincter was relaxed with void.  The details of the procedure with the study tracings have been scanned into EPIC.   Urodynamic Impression:  1. Sensation was normal; capacity was normal 2. Stress Incontinence was demonstrated at normal pressures; 3. Detrusor Overactivity was not demonstrated. 4. Emptying was dysfunctional with a elevated PVR, a sustained detrusor contraction present,  abdominal straining not present, normal urethral sphincter activity on EMG.  Plan: - The patient will follow up  to discuss the findings and treatment options.

## 2023-08-11 ENCOUNTER — Other Ambulatory Visit: Payer: Self-pay | Admitting: Nurse Practitioner

## 2023-08-11 DIAGNOSIS — I10 Essential (primary) hypertension: Secondary | ICD-10-CM

## 2023-08-12 ENCOUNTER — Ambulatory Visit (INDEPENDENT_AMBULATORY_CARE_PROVIDER_SITE_OTHER): Admitting: Family Medicine

## 2023-08-12 ENCOUNTER — Encounter: Payer: Self-pay | Admitting: Family Medicine

## 2023-08-12 VITALS — BP 115/73 | HR 81 | Temp 98.2°F | Ht 65.0 in | Wt 195.0 lb

## 2023-08-12 DIAGNOSIS — Z6832 Body mass index (BMI) 32.0-32.9, adult: Secondary | ICD-10-CM

## 2023-08-12 DIAGNOSIS — E66811 Obesity, class 1: Secondary | ICD-10-CM

## 2023-08-12 DIAGNOSIS — Z7289 Other problems related to lifestyle: Secondary | ICD-10-CM

## 2023-08-12 DIAGNOSIS — E65 Localized adiposity: Secondary | ICD-10-CM | POA: Diagnosis not present

## 2023-08-12 DIAGNOSIS — Z9189 Other specified personal risk factors, not elsewhere classified: Secondary | ICD-10-CM

## 2023-08-12 DIAGNOSIS — R7303 Prediabetes: Secondary | ICD-10-CM | POA: Diagnosis not present

## 2023-08-12 DIAGNOSIS — E88819 Insulin resistance, unspecified: Secondary | ICD-10-CM

## 2023-08-12 NOTE — Patient Instructions (Addendum)
 You can have 2 fresh or frozen fruit servings per day (any, portioned out) You can swap out a fruit for the bread with breakfast  Try out [employer] refrigerated salad dressings or plain greek yogurt + powdered ranch  For toast, you can use a little peanut butter OR [place] butter spread with olive oil  GREAT JOB!  Try out lunch alternative on sheet  Ramp up walking time to 30 min 3-4 x a week

## 2023-08-12 NOTE — Progress Notes (Signed)
 Office: [phone]  /  Fax: [phone]  WEIGHT SUMMARY AND BIOMETRICS  Starting Date: 07/29/23  Starting Weight: 200lb   Weight Lost Since Last Visit: 5lb   Vitals Temp: 98.2 F (36.8 C) BP: 115/73 Pulse Rate: 81 SpO2: 98 %   Body Composition  Body Fat %: 43.1 % Fat Mass (lbs): 84.4 lbs Muscle Mass (lbs): 105.8 lbs Total Body Water (lbs): 71.6 lbs Visceral Fat Rating : 12    HPI  Chief Complaint: OBESITY  Jane Howard is here to discuss her progress with her obesity treatment plan. She is on the the Category 2 Plan and states she is following her eating plan approximately 95 % of the time. She states she is exercising 0 minutes 0 times per week.  Interval History:  Since last office visit she is down 5 lb She is up 0.6 lb of muscle mass and down 6.6 lb of body fat She is liking most of the food on her meal plan but does not like sugar free jelly or dressings She needs to 'cook different' for her husband and grandson She denies hunger or cravings She is picking snacks on her list She plans to start walking more- has [employer] but hasn't check out gym options yet  Pharmacotherapy: none  PHYSICAL EXAM:  Blood pressure 115/73, pulse 81, temperature 98.2 F (36.8 C), height 5' 5 (1.651 m), weight 195 lb (88.5 kg), SpO2 98%. Body mass index is 32.45 kg/m.  General: She is overweight, cooperative, alert, well developed, and in no acute distress. PSYCH: Has normal mood, affect and thought process.   Lungs: Normal breathing effort, no conversational dyspnea.  ASSESSMENT AND PLAN  TREATMENT PLAN FOR OBESITY:  Recommended Dietary Goals  Jane Howard is currently in the action stage of change. As such, her goal is to continue weight management plan. She has agreed to the Category 2 Plan. Additional lunch handout given Reviewed dietary change goals on AVS  Behavioral Intervention  We discussed the following Behavioral Modification Strategies today:  increasing lean protein intake to established goals, increasing lower glycemic fruits, increasing fiber rich foods, avoiding skipping meals, increasing water intake , work on meal planning and preparation, keeping healthy foods at home, and continue to work on maintaining a reduced calorie state, getting the recommended amount of protein, incorporating whole foods, making healthy choices, staying well hydrated and practicing mindfulness when eating..  Additional resources provided today: NA  Recommended Physical Activity Goals  Jane Howard has been advised to work up to 150 minutes of moderate intensity aerobic activity a week and strengthening exercises 2-3 times per week for cardiovascular health, weight loss maintenance and preservation of muscle mass.   She has agreed to Start aerobic activity with a goal of 150 minutes a week at moderate intensity.  Add in more regular walking, outdoors or at the gym  Pharmacotherapy changes for the treatment of obesity: none  ASSOCIATED CONDITIONS ADDRESSED TODAY  Prediabetes Lab Results  Component Value Date   HGBA1C 5.7 (H) 07/29/2023   Reviewed lab results with patient Look for improvements in prediabetes with a lower sugar reduced kcal diet, adding in regular exercise and weight reduction.  Aim for a 10% TBW loss (20 lb).   Repeat A1c in 4 mos  Class 1 obesity due to excess calories with serious comorbidity and body mass index (BMI) of 32.0 to 32.9 in adult  Insulin  resistance Reviewed lab results with patient from last visit Fasting insulin  high at 17 Explained how insulin  resistance contributes  to body fat deposition, increased appetite and truncal adiposity  Continue to work on a lower sugar/ modified low carb diet, adding in 30 min of walking most days of the week and weight reduction with a visceral fat rating goal <10. Consider the addition of metformin  Sedentary lifestyle Unchanged We set a goal to increase walking time which is the  type of exercise she prefers This will help her to maintain lean body mass, improve cardiovascular fitness and manage weight loss.  She will check out her local gym options using [employer] or walk outdoors  Central adiposity Improving She has started to see movement on the scale including a reduction in visceral fat rating 13-->12 in 2 weeks on prescribed diet Aim for a VFR <10 and continue to follow with prescribed diet and exercise plan   She was informed of the importance of frequent follow up visits to maximize her success with intensive lifestyle modifications for her multiple health conditions.   ATTESTASTION STATEMENTS:  Reviewed by clinician on day of visit: allergies, medications, problem list, medical history, surgical history, family history, social history, and previous encounter notes pertinent to obesity diagnosis.   I have personally spent 32 minutes total time today in preparation, patient care, nutritional counseling and education,  and documentation for this visit, including the following: review of most recent clinical lab tests, reviewing medical assistant documentation, review and interpretation of bioimpedence results.     Darice Haddock, D.O. DABFM, DABOM [facility] [address] [place], [place] [address] [phone]

## 2023-08-17 ENCOUNTER — Ambulatory Visit (INDEPENDENT_AMBULATORY_CARE_PROVIDER_SITE_OTHER): Payer: PRIVATE HEALTH INSURANCE | Admitting: Nurse Practitioner

## 2023-08-17 ENCOUNTER — Encounter: Payer: Self-pay | Admitting: Nurse Practitioner

## 2023-08-17 VITALS — BP 130/69 | HR 73 | Temp 98.9°F | Ht 65.0 in | Wt 198.0 lb

## 2023-08-17 DIAGNOSIS — I10 Essential (primary) hypertension: Secondary | ICD-10-CM | POA: Diagnosis not present

## 2023-08-17 DIAGNOSIS — M1A9XX0 Chronic gout, unspecified, without tophus (tophi): Secondary | ICD-10-CM

## 2023-08-17 DIAGNOSIS — E669 Obesity, unspecified: Secondary | ICD-10-CM

## 2023-08-17 DIAGNOSIS — E782 Mixed hyperlipidemia: Secondary | ICD-10-CM

## 2023-08-17 DIAGNOSIS — E559 Vitamin D deficiency, unspecified: Secondary | ICD-10-CM

## 2023-08-17 DIAGNOSIS — K5901 Slow transit constipation: Secondary | ICD-10-CM

## 2023-08-17 DIAGNOSIS — F5101 Primary insomnia: Secondary | ICD-10-CM

## 2023-08-17 DIAGNOSIS — F411 Generalized anxiety disorder: Secondary | ICD-10-CM

## 2023-08-17 DIAGNOSIS — K219 Gastro-esophageal reflux disease without esophagitis: Secondary | ICD-10-CM

## 2023-08-17 LAB — LIPID PANEL

## 2023-08-17 MED ORDER — ATORVASTATIN CALCIUM 20 MG PO TABS: 20.0000 mg | ORAL_TABLET | Freq: Every day | ORAL | 1 refills | Status: DC

## 2023-08-17 MED ORDER — ALLOPURINOL 100 MG PO TABS: 100.0000 mg | ORAL_TABLET | Freq: Every day | ORAL | 1 refills | Status: AC

## 2023-08-17 MED ORDER — ESCITALOPRAM OXALATE 10 MG PO TABS: 10.0000 mg | ORAL_TABLET | Freq: Every day | ORAL | 1 refills | Status: DC

## 2023-08-17 MED ORDER — CLONAZEPAM 0.5 MG PO TABS: 0.5000 mg | ORAL_TABLET | Freq: Two times a day (BID) | ORAL | 5 refills | Status: DC | PRN

## 2023-08-17 MED ORDER — HYDROCHLOROTHIAZIDE 50 MG PO TABS: 50.0000 mg | ORAL_TABLET | Freq: Every day | ORAL | 1 refills | Status: DC

## 2023-08-17 MED ORDER — LISINOPRIL 40 MG PO TABS: 40.0000 mg | ORAL_TABLET | Freq: Every day | ORAL | 1 refills | Status: DC

## 2023-08-17 NOTE — Addendum Note (Signed)
 Addended by: Yaritzi Craun, MARY-MARGARET on: 08/17/2023 10:19 AM   Modules accepted: Orders

## 2023-08-17 NOTE — Progress Notes (Signed)
 Subjective:    Patient ID: Jane Howard, female    DOB: 05-08-1955, 68 y.o.   MRN: [id number]   Chief Complaint: medical management of chronic issues     HPI:  Jane Howard is a 68 y.o. who identifies as a female who was assigned female at birth.   Social history: Lives with: her grandson still lives with her Work history: retired   Water engineer in today for follow up of the following chronic medical issues:  1. Essential hypertension, benign No c/o chest pain, sob or headache. Doe snot check blood pressure at home BP Readings from Last 3 Encounters:  08/12/23 115/73  08/10/23 110/72  07/29/23 139/79     2. Mixed hyperlipidemia Doe snot watch diet very closely. No exercise. Lab Results  Component Value Date   CHOL 185 02/16/2023   HDL 54 02/16/2023   LDLCALC 108 (H) 02/16/2023   TRIG 131 02/16/2023   CHOLHDL 3.4 02/16/2023   The 10-year ASCVD risk score (Arnett DK, et al., 2019) is: 7.3%   3. Gastroesophageal reflux disease without esophagitis No recent issues. On no prescription meds for this  4. Slow transit constipation Only has occasionally. Probiotic really helps. Will take a stool softner occasionally  5. GAD (generalized anxiety disorder) Is on combintion of klonopin  and lexapro . Says she is doing well.    08/17/2023   10:02 AM 08/17/2023    9:44 AM 06/15/2023    9:00 AM 08/11/2022   10:22 AM  GAD 7 : Generalized Anxiety Score  Nervous, Anxious, on Edge 0 0 0 0  Control/stop worrying 0 0 0 0  Worry too much - different things 0 0 0 0  Trouble relaxing 0 0 0 0  Restless 0 0 0 0  Easily annoyed or irritable 0 0 0 0  Afraid - awful might happen 0 0 0 0  Total GAD 7 Score 0 0 0 0  Anxiety Difficulty  Not difficult at all Not difficult at all Not difficult at all       08/17/2023   10:02 AM 08/17/2023    9:44 AM 06/15/2023    8:59 AM  Depression screen PHQ 2/9  Decreased Interest 0 0 0  Down, Depressed, Hopeless 0 0 0  PHQ - 2 Score 0 0 0  Altered  sleeping 0    Tired, decreased energy 0    Change in appetite 0    Feeling bad or failure about yourself  0    Trouble concentrating 0    Moving slowly or fidgety/restless 0    Suicidal thoughts 0    PHQ-9 Score 0    Difficult doing work/chores Not difficult at all        Contract signed 02/16/23- urine drug screen today  .  6. Chronic gout without tophus, unspecified cause, unspecified site No recent flare ups  7. Primary insomnia Doing well. Sleeps about 6 hours a night  8. Vitamin D  deficiency Daily vitamin d  supplement Last vitamin D  Lab Results  Component Value Date   VD25OH 46.0 08/11/2022     9. Obesity (BMI 30-39.9) No recent weight changes   Wt Readings from Last 3 Encounters:  08/17/23 198 lb (89.8 kg)  08/12/23 195 lb (88.5 kg)  07/29/23 200 lb (90.7 kg)   BMI Readings from Last 3 Encounters:  08/17/23 32.95 kg/m  08/12/23 32.45 kg/m  07/29/23 33.28 kg/m        New complaints: None today  Allergies  Allergen  Reactions   Amoxicillin  Other (See Comments)    Causes yeast infection   Macrodantin [Nitrofurantoin Macrocrystal]    Vicodin [Hydrocodone -Acetaminophen ]    Outpatient Encounter Medications as of 08/17/2023  Medication Sig   allopurinol  (ZYLOPRIM ) 100 MG tablet Take 1 tablet (100 mg total) by mouth daily.   aspirin EC 81 MG tablet Take 81 mg by mouth daily.   atorvastatin  (LIPITOR) 20 MG tablet Take 1 tablet (20 mg total) by mouth daily.   Calcium  Carb-Cholecalciferol (CALCIUM  500+D) 500-5 MG-MCG TABS Take 1 tablet by mouth daily.   Cholecalciferol (VITAMIN D3) 125 MCG (5000 UT) CAPS Take 1 capsule by mouth daily.   clonazePAM  (KLONOPIN ) 0.5 MG tablet Take 1 tablet (0.5 mg total) by mouth 2 (two) times daily as needed for anxiety.   escitalopram  (LEXAPRO ) 10 MG tablet Take 1 tablet (10 mg total) by mouth daily.   estradiol  (ESTRACE ) 0.1 MG/GM vaginal cream Use 1/2 g vaginally two or three times per week as needed to maintain symptom  relief.   fluticasone  (FLONASE ) 50 MCG/ACT nasal spray PLACE 1 SPRAY INTO BOTH NOSTRILS 2 (TWO) TIMES DAILY AS NEEDED FOR ALLERGIES OR RHINITIS.   hydrochlorothiazide  (HYDRODIURIL ) 50 MG tablet TAKE 1 TABLET BY MOUTH EVERY DAY   lisinopril  (ZESTRIL ) 40 MG tablet Take 1 tablet (40 mg total) by mouth daily.   Multiple Vitamin (MULTI-DAY PO) Take 1 tablet by mouth daily. L-Lysine   Multiple Vitamins-Minerals (EYE VITAMINS PO) Take by mouth.   Probiotic Product (PROBIOTIC PO)    psyllium (METAMUCIL) 58.6 % packet Take 1 packet by mouth daily.   sulfamethoxazole -trimethoprim  (BACTRIM  DS) 800-160 MG tablet Take 1 tablet by mouth 2 (two) times daily.   tamsulosin  (FLOMAX ) 0.4 MG CAPS capsule TAKE 1 CAPSULE (0.4 MG TOTAL) BY MOUTH DAILY AS NEEDED.   No facility-administered encounter medications on file as of 08/17/2023.    Past Surgical History:  Procedure Laterality Date   ABDOMINAL HYSTERECTOMY     FOOT SURGERY Bilateral    NASAL SEPTUM SURGERY     TONSILLECTOMY      Family History  Problem Relation Age of Onset   Hyperlipidemia Mother    Hypertension Mother    Heart disease Mother    Kidney disease Mother    Hyperlipidemia Father    Hypertension Father    Prostate cancer Father        prostate   Heart disease Father    Kidney disease Father    Bladder Cancer Neg Hx    Uterine cancer Neg Hx       Controlled substance contract: 02/11/22     Review of Systems  Constitutional:  Negative for diaphoresis.  Eyes:  Negative for pain.  Respiratory:  Negative for shortness of breath.   Cardiovascular:  Negative for chest pain, palpitations and leg swelling.  Gastrointestinal:  Negative for abdominal pain.  Endocrine: Negative for polydipsia.  Skin:  Negative for rash.  Neurological:  Negative for dizziness, weakness and headaches.  Hematological:  Does not bruise/bleed easily.  All other systems reviewed and are negative.      Objective:   Physical Exam Vitals and nursing  note reviewed.  Constitutional:      General: She is not in acute distress.    Appearance: Normal appearance. She is well-developed.  HENT:     Head: Normocephalic.     Right Ear: Tympanic membrane normal.     Left Ear: Tympanic membrane normal.     Nose: Nose normal.  Mouth/Throat:     Mouth: Mucous membranes are moist.  Eyes:     Pupils: Pupils are equal, round, and reactive to light.  Neck:     Vascular: No carotid bruit or JVD.  Cardiovascular:     Rate and Rhythm: Normal rate and regular rhythm.     Heart sounds: Normal heart sounds.  Pulmonary:     Effort: Pulmonary effort is normal. No respiratory distress.     Breath sounds: Normal breath sounds. No wheezing or rales.  Chest:     Chest wall: No tenderness.  Abdominal:     General: Bowel sounds are normal. There is no distension or abdominal bruit.     Palpations: Abdomen is soft. There is no hepatomegaly, splenomegaly, mass or pulsatile mass.     Tenderness: There is no abdominal tenderness.  Musculoskeletal:        General: Normal range of motion.     Cervical back: Normal range of motion and neck supple.  Lymphadenopathy:     Cervical: No cervical adenopathy.  Skin:    General: Skin is warm and dry.  Neurological:     Mental Status: She is alert and oriented to person, place, and time.     Deep Tendon Reflexes: Reflexes are normal and symmetric.  Psychiatric:        Behavior: Behavior normal.        Thought Content: Thought content normal.        Judgment: Judgment normal.    BP 130/69   Pulse 73   Temp 98.9 F (37.2 C) (Temporal)   Ht 5' 5 (1.651 m)   Wt 198 lb (89.8 kg)   SpO2 100%   BMI 32.95 kg/m           Assessment & Plan:   Jane Howard comes in today with chief complaint of medical management of chronic issues    Diagnosis and orders addressed:  1. Essential hypertension, benign Low sodium diet - CBC with Differential/Platelet - CMP14+EGFR - hydrochlorothiazide   (HYDRODIURIL ) 50 MG tablet; Take 1 tablet (50 mg total) by mouth daily.  Dispense: 90 tablet; Refill: 1 - lisinopril  (ZESTRIL ) 40 MG tablet; Take 1 tablet (40 mg total) by mouth daily.  Dispense: 90 tablet; Refill: 1  2. Mixed hyperlipidemia Low fat diet - Lipid panel - atorvastatin  (LIPITOR) 20 MG tablet; Take 1 tablet (20 mg total) by mouth daily.  Dispense: 90 tablet; Refill: 1  3. Gastroesophageal reflux disease without esophagitis Avoid spicy foods Do not eat 2 hours prior to bedtime  4. Slow transit constipation Increase fiber  5. GAD (generalized anxiety disorder) Stress management - clonazePAM  (KLONOPIN ) 0.5 MG tablet; Take 1 tablet (0.5 mg total) by mouth 2 (two) times daily as needed for anxiety.  Dispense: 60 tablet; Refill: 5 - escitalopram  (LEXAPRO ) 10 MG tablet; Take 1 tablet (10 mg total) by mouth daily.  Dispense: 90 tablet; Refill: 1  6. Chronic gout without tophus, unspecified cause, unspecified site  - allopurinol  (ZYLOPRIM ) 100 MG tablet; Take 1 tablet (100 mg total) by mouth daily.  Dispense: 90 tablet; Refill: 1  7. Primary insomnia Bedtime routine  8. Vitamin D  deficiency Continue vitamin d  supplement - VITAMIN D  25 Hydroxy (Vit-D Deficiency, Fractures)  9. Obesity (BMI 30-39.9) Discussed diet and exercise for person with BMI >25 Will recheck weight in 3-6 months    Labs pending Health Maintenance reviewed Diet and exercise encouraged  Follow up plan: 6 months   Mary-Margaret Gladis, FNP

## 2023-08-17 NOTE — Patient Instructions (Signed)

## 2023-08-18 LAB — CMP14+EGFR
ALT: 20 IU/L (ref 0–32)
AST: 19 IU/L (ref 0–40)
Albumin: 4.5 g/dL (ref 3.9–4.9)
Alkaline Phosphatase: 84 IU/L (ref 44–121)
BUN/Creatinine Ratio: 31 — AB (ref 12–28)
BUN: 31 mg/dL — AB (ref 8–27)
Bilirubin Total: 0.5 mg/dL (ref 0.0–1.2)
CO2: 20 mmol/L (ref 20–29)
Calcium: 10 mg/dL (ref 8.7–10.3)
Chloride: 101 mmol/L (ref 96–106)
Creatinine, Ser: 1.01 mg/dL — AB (ref 0.57–1.00)
Globulin, Total: 2.7 g/dL (ref 1.5–4.5)
Glucose: 101 mg/dL — AB (ref 70–99)
Potassium: 5.1 mmol/L (ref 3.5–5.2)
Sodium: 138 mmol/L (ref 134–144)
Total Protein: 7.2 g/dL (ref 6.0–8.5)
eGFR: 61 mL/min/1.73 (ref >59)

## 2023-08-18 LAB — CBC WITH DIFFERENTIAL/PLATELET
Basophils Absolute: 0.1 x10E3/uL (ref 0.0–0.2)
Basos: 1 %
EOS (ABSOLUTE): 0.1 x10E3/uL (ref 0.0–0.4)
Eos: 1 %
Hematocrit: 43.3 % (ref 34.0–46.6)
Hemoglobin: 13.9 g/dL (ref 11.1–15.9)
Immature Grans (Abs): 0 x10E3/uL (ref 0.0–0.1)
Immature Granulocytes: 0 %
Lymphocytes Absolute: 2.2 x10E3/uL (ref 0.7–3.1)
Lymphs: 33 %
MCH: 29.5 pg (ref 26.6–33.0)
MCHC: 32.1 g/dL (ref 31.5–35.7)
MCV: 92 fL (ref 79–97)
Monocytes Absolute: 0.4 x10E3/uL (ref 0.1–0.9)
Monocytes: 6 %
Neutrophils Absolute: 3.9 x10E3/uL (ref 1.4–7.0)
Neutrophils: 59 %
Platelets: 256 x10E3/uL (ref 150–450)
RBC: 4.71 x10E6/uL (ref 3.77–5.28)
RDW: 12.7 % (ref 11.7–15.4)
WBC: 6.7 x10E3/uL (ref 3.4–10.8)

## 2023-08-18 LAB — LIPID PANEL
Cholesterol, Total: 163 mg/dL (ref 100–199)
HDL: 51 mg/dL (ref >39)
LDL CALC COMMENT:: 3.2 ratio (ref 0.0–4.4)
LDL Chol Calc (NIH): 89 mg/dL (ref 0–99)
Triglycerides: 131 mg/dL (ref 0–149)
VLDL Cholesterol Cal: 23 mg/dL (ref 5–40)

## 2023-08-19 ENCOUNTER — Ambulatory Visit: Payer: Self-pay | Admitting: Nurse Practitioner

## 2023-08-26 NOTE — Progress Notes (Signed)
 [facility] Return Visit  SUBJECTIVE  History of Present Illness: Jane Howard is a 68 y.o. female seen in follow-up for Stage III pelvic organ prolapse, mixed urinary incontinence, constipation, sensation of incomplete emptying, vaginal atrophy, history of anterior colporrhaphy, obesity, and pessary use. Plan at last visit was fluid management, caffeine reduction, low dose vaginal estrogen, fiber supplementation, squatting position for defecation, continue short stem 2 1/2 gellhorn pessary use.   Changed to 2 3/4in long stem gellhorn since 07/22/23, denies bulge symptoms. Denies discharge or bleeding Drinking more water to lose weight, lost 5lbs SUI 1-2x/day and desires to review treatment options UUI 1x/day if she waits too long to void after urge Using vaginal estrogen 2x/week ($70/tube) Uses metamucil gummies 2-3x/week.   Urodynamic Impression 08/10/23:  1. Sensation was normal; capacity was normal 2. Stress Incontinence was demonstrated at normal pressures; 3. Detrusor Overactivity was not demonstrated. 4. Emptying was dysfunctional with a elevated PVR and intermittent pattern, a sustained detrusor contraction present,  abdominal straining not present, normal urethral sphincter activity on EMG. Pdet at Qmax was 23 cm of water.  Qmax was 13 mL/sec.    Past Medical History: Patient  has a past medical history of Anxiety, Back pain, Chest pain, Constipation, Depression, Edema, GERD (gastroesophageal reflux disease), High blood pressure, High cholesterol, Joint pain, Nephrolithiasis, Palpitations, and Vitamin D  deficiency.   Past Surgical History: She  has a past surgical history that includes Tonsillectomy; Foot surgery (Bilateral); Abdominal hysterectomy; and Nasal septum surgery.   Medications: She has a current medication list which includes the following prescription(s): allopurinol , aspirin ec, atorvastatin , calcium  carb-cholecalciferol, vitamin d3,  clonazepam , escitalopram , fluticasone , hydrochlorothiazide , lisinopril , multiple vitamin, multiple vitamins-minerals, probiotic product, psyllium, and estradiol .   Allergies: Patient is allergic to amoxicillin , macrodantin [nitrofurantoin macrocrystal], and vicodin [hydrocodone -acetaminophen ].   Social History: Patient  reports that she has never smoked. She has never used smokeless tobacco. She reports current alcohol use. She reports that she does not use drugs.     OBJECTIVE     Physical Exam: Vitals:   08/27/23 1427  BP: 108/71  Pulse: 82   Gen: No apparent distress, A&O x 3.  Detailed Urogynecologic Evaluation:  Deferred. Prior exam showed:  Straight Catheterization Procedure for PVR: After verbal consent was obtained from the patient for catheterization to assess bladder emptying and residual volume the urethra and surrounding tissues were prepped with betadine and an in and out catheterization was performed.  PVR was .  Urine appeared clear yellow. The patient tolerated the procedure well.       ASSESSMENT AND PLAN    Jane Howard is a 68 y.o. with:  1. Feeling of incomplete bladder emptying   2. Slow transit constipation   3. History of pelvic surgery   4. Pelvic organ prolapse quantification stage 3 cystocele   5. Presence of pessary   6. Urinary incontinence, mixed   7. Vaginal atrophy     Feeling of incomplete bladder emptying Assessment & Plan: - prior bladder scan , repeat today - UDS 08/10/23 with SUI, noted elevated PVR . Pdet at Qmax was 23 cm of water.  Qmax was 13 mL/sec.  - possibly prior outlet obstruction, anterior vaginal wall noted at introitus with pessary in place - no anterior vaginal wall noted with 2 3/4in long stem gellhorn since 07/22/23 - repeat PVR at follow-up   Slow transit constipation Assessment & Plan: - uses probiotics and stool softener every 1-2 day - For constipation,  we reviewed the importance of a better  bowel regimen.  We also discussed the importance of avoiding chronic straining, as it can exacerbate her pelvic floor symptoms; we discussed treating constipation and straining prior to surgery, as postoperative straining can lead to damage to the repair and recurrence of symptoms. We discussed initiating therapy with increasing fluid intake, fiber supplementation, stool softeners, and laxatives such as miralax.  - encouraged to continue titration of fiber supplementation - discussed association with pelvic floor disorders - avoid straining and encouraged squatty potty   History of pelvic surgery Assessment & Plan: - S/p TVH, anterior colporrhaphy, possible posterior colporrhaphy for pelvic organ prolapse in 2000 by Dr. Cary - urodynamics showed SUI with elevated PVR, normal PVR today   Pelvic organ prolapse quantification stage 3 cystocele Assessment & Plan: - uses short stemmed 2 1/2 gellhorn pessary with anterior vaginal wall at 0, reduced prolapse symptoms with 2 3/4in long stem gellhorn - For treatment of pelvic organ prolapse, we discussed options for management including expectant management, conservative management, and surgical management, such as Kegels, a pessary, pelvic floor physical therapy, and specific surgical procedures. - currently not sexually active due to space occupying pessary in place, reviewed surgical options if she desires to proceed  - desires to continue pessary management due to symptomatic relief at this time   Presence of pessary Assessment & Plan: - discussed risk of change in urinary or bowel symptoms, vaginal ulceration, discharge, bleeding, fistula formation. Explained that pt may require multiple sizes and types for fitting.  - pt reports discomfort at time of pessary management, encouraged pt to use topical lidocaine at the time of pessary management to reduce discomfort - encouraged to continue vaginal estrogen 1g 2x/week for therapeutic dosing -  return in 2 months (3 months from prior pessary check)   Urinary incontinence, mixed Assessment & Plan: - 04/02/23 POCT UA negative, bladder scan . Repeat with 2 3/4in long stem gellhorn pessary - urgency > stress prior to pessary refitting, now stress symptoms more bothersome due to 1-2x/day - We discussed the symptoms of overactive bladder (OAB), which include urinary urgency, urinary frequency, nocturia, with or without urge incontinence.  While we do not know the exact etiology of OAB, several treatment options exist. We discussed management including behavioral therapy (decreasing bladder irritants, urge suppression strategies, timed voids, bladder retraining), physical therapy, medication; for refractory cases posterior tibial nerve stimulation, sacral neuromodulation, and intravesical botulinum toxin injection.  For anticholinergic medications, we discussed the potential side effects of anticholinergics including dry eyes, dry mouth, constipation, cognitive impairment and urinary retention. For Beta-3 agonist medication, we discussed the potential side effect of elevated blood pressure which is more likely to occur in individuals with uncontrolled hypertension. - declines medications at this time, encouraged fluid management, caffeine reduction - For treatment of stress urinary incontinence,  non-surgical options include expectant management, weight loss, physical therapy, as well as a pessary.  Surgical options include a midurethral sling, Burch urethropexy, and transurethral injection of a bulking agent. - continue low dose vaginal estrogen use to maintenance dosing - encouraged Kegel exercises - She prefers an office procedure with urethral bulking (Bulkamid). We discussed success rate of approximately 70-80% and possible need for second injection. We reviewed that this is not a permanent procedure and the Bulkamid does become less effective over time. Risks reviewed including  injury to bladder or urethra, UTI, urinary retention and hematuria.  - office to schedule   Vaginal atrophy Assessment & Plan:  For symptomatic vaginal atrophy options include lubrication with a water-based lubricant, personal hygiene measures and barrier protection against wetness, and estrogen replacement in the form of vaginal cream, vaginal tablets, or a time-released vaginal ring.   - continue low dose vaginal estrogen 1g 2x/week for therapeutic dosing   Other orders -     Estradiol ; Use 1/2 g vaginally two or three times per week as needed to maintain symptom relief.  Dispense: 42.5 g; Refill: 1  Time spent: I spent 25 minutes dedicated to the care of this patient on the date of this encounter to include pre-visit review of records, face-to-face time with the patient discussing urethral bulking, mixed urinary incontinence, vaginal atrophy, pessary maintenance, stage III pelvic organ prolapse, sensation of incomplete emptying and post visit documentation and ordering medication/ testing.   Lianne ONEIDA Gillis, MD

## 2023-08-27 ENCOUNTER — Ambulatory Visit (INDEPENDENT_AMBULATORY_CARE_PROVIDER_SITE_OTHER): Admitting: Obstetrics

## 2023-08-27 ENCOUNTER — Encounter: Payer: Self-pay | Admitting: Obstetrics

## 2023-08-27 VITALS — BP 108/71 | HR 82

## 2023-08-27 DIAGNOSIS — N3946 Mixed incontinence: Secondary | ICD-10-CM

## 2023-08-27 DIAGNOSIS — K5901 Slow transit constipation: Secondary | ICD-10-CM | POA: Diagnosis not present

## 2023-08-27 DIAGNOSIS — Z960 Presence of urogenital implants: Secondary | ICD-10-CM

## 2023-08-27 DIAGNOSIS — N952 Postmenopausal atrophic vaginitis: Secondary | ICD-10-CM

## 2023-08-27 DIAGNOSIS — N8110 Cystocele, unspecified: Secondary | ICD-10-CM | POA: Diagnosis not present

## 2023-08-27 DIAGNOSIS — R3914 Feeling of incomplete bladder emptying: Secondary | ICD-10-CM | POA: Diagnosis not present

## 2023-08-27 DIAGNOSIS — Z9889 Other specified postprocedural states: Secondary | ICD-10-CM

## 2023-08-27 MED ORDER — ESTRADIOL 0.1 MG/GM VA CREA: TOPICAL_CREAM | VAGINAL | 1 refills | Status: AC

## 2023-08-27 NOTE — Patient Instructions (Addendum)
 For stress urinary incontinence we discussed an office procedure with urethral bulking (Bulkamid). We discussed success rate of approximately 70-80% and possible need for second injection. We reviewed that this is not a permanent procedure and the Bulkamid does become less effective over time. Risks reviewed including injury to bladder or urethra, UTI, urinary retention and hematuria.   Continue pessary use and pessary check every 3 months.   Please call if you experience any change in urinary or vaginal symptoms.

## 2023-08-30 NOTE — Assessment & Plan Note (Signed)
 For symptomatic vaginal atrophy options include lubrication with a water-based lubricant, personal hygiene measures and barrier protection against wetness, and estrogen replacement in the form of vaginal cream, vaginal tablets, or a time-released vaginal ring.   - continue low dose vaginal estrogen 1g 2x/week for therapeutic dosing

## 2023-08-30 NOTE — Assessment & Plan Note (Signed)
-   prior bladder scan , repeat today - UDS 08/10/23 with SUI, noted elevated PVR . Pdet at Qmax was 23 cm of water.  Qmax was 13 mL/sec.  - possibly prior outlet obstruction, anterior vaginal wall noted at introitus with pessary in place - no anterior vaginal wall noted with 2 3/4in long stem gellhorn since 07/22/23 - repeat PVR at follow-up

## 2023-08-30 NOTE — Assessment & Plan Note (Addendum)
-   04/02/23 POCT UA negative, bladder scan . Repeat with 2 3/4in long stem gellhorn pessary - urgency > stress prior to pessary refitting, now stress symptoms more bothersome due to 1-2x/day - We discussed the symptoms of overactive bladder (OAB), which include urinary urgency, urinary frequency, nocturia, with or without urge incontinence.  While we do not know the exact etiology of OAB, several treatment options exist. We discussed management including behavioral therapy (decreasing bladder irritants, urge suppression strategies, timed voids, bladder retraining), physical therapy, medication; for refractory cases posterior tibial nerve stimulation, sacral neuromodulation, and intravesical botulinum toxin injection.  For anticholinergic medications, we discussed the potential side effects of anticholinergics including dry eyes, dry mouth, constipation, cognitive impairment and urinary retention. For Beta-3 agonist medication, we discussed the potential side effect of elevated blood pressure which is more likely to occur in individuals with uncontrolled hypertension. - declines medications at this time, encouraged fluid management, caffeine reduction - For treatment of stress urinary incontinence,  non-surgical options include expectant management, weight loss, physical therapy, as well as a pessary.  Surgical options include a midurethral sling, Burch urethropexy, and transurethral injection of a bulking agent. - continue low dose vaginal estrogen use to maintenance dosing - encouraged Kegel exercises - She prefers an office procedure with urethral bulking (Bulkamid). We discussed success rate of approximately 70-80% and possible need for second injection. We reviewed that this is not a permanent procedure and the Bulkamid does become less effective over time. Risks reviewed including injury to bladder or urethra, UTI, urinary retention and hematuria.  - office to schedule

## 2023-08-30 NOTE — Assessment & Plan Note (Signed)
-   S/p TVH, anterior colporrhaphy, possible posterior colporrhaphy for pelvic organ prolapse in 2000 by Dr. Cary - urodynamics showed SUI with elevated PVR, normal PVR today

## 2023-08-30 NOTE — Assessment & Plan Note (Signed)
-   discussed risk of change in urinary or bowel symptoms, vaginal ulceration, discharge, bleeding, fistula formation. Explained that pt may require multiple sizes and types for fitting.  - pt reports discomfort at time of pessary management, encouraged pt to use topical lidocaine at the time of pessary management to reduce discomfort - encouraged to continue vaginal estrogen 1g 2x/week for therapeutic dosing - return in 2 months (3 months from prior pessary check)

## 2023-08-30 NOTE — Assessment & Plan Note (Signed)
-   uses short stemmed 2 1/2 gellhorn pessary with anterior vaginal wall at 0, reduced prolapse symptoms with 2 3/4in long stem gellhorn - For treatment of pelvic organ prolapse, we discussed options for management including expectant management, conservative management, and surgical management, such as Kegels, a pessary, pelvic floor physical therapy, and specific surgical procedures. - currently not sexually active due to space occupying pessary in place, reviewed surgical options if she desires to proceed  - desires to continue pessary management due to symptomatic relief at this time

## 2023-08-30 NOTE — Assessment & Plan Note (Signed)
-   uses probiotics and stool softener every 1-2 day - For constipation, we reviewed the importance of a better bowel regimen.  We also discussed the importance of avoiding chronic straining, as it can exacerbate her pelvic floor symptoms; we discussed treating constipation and straining prior to surgery, as postoperative straining can lead to damage to the repair and recurrence of symptoms. We discussed initiating therapy with increasing fluid intake, fiber supplementation, stool softeners, and laxatives such as miralax.  - encouraged to continue titration of fiber supplementation - discussed association with pelvic floor disorders - avoid straining and encouraged squatty potty

## 2023-08-31 ENCOUNTER — Ambulatory Visit (INDEPENDENT_AMBULATORY_CARE_PROVIDER_SITE_OTHER): Admitting: Nurse Practitioner

## 2023-08-31 ENCOUNTER — Encounter: Payer: Self-pay | Admitting: Nurse Practitioner

## 2023-08-31 VITALS — BP 117/72 | HR 85 | Temp 98.2°F | Ht 65.0 in | Wt 191.0 lb

## 2023-08-31 DIAGNOSIS — E88819 Insulin resistance, unspecified: Secondary | ICD-10-CM | POA: Diagnosis not present

## 2023-08-31 DIAGNOSIS — R7303 Prediabetes: Secondary | ICD-10-CM

## 2023-08-31 DIAGNOSIS — E66811 Obesity, class 1: Secondary | ICD-10-CM | POA: Diagnosis not present

## 2023-08-31 DIAGNOSIS — Z6831 Body mass index (BMI) 31.0-31.9, adult: Secondary | ICD-10-CM | POA: Diagnosis not present

## 2023-08-31 NOTE — Progress Notes (Signed)
 Office: [phone]  /  Fax: [phone]  WEIGHT SUMMARY AND BIOMETRICS  Weight Lost Since Last Visit: 4lb  Weight Gained Since Last Visit: 0lb   Vitals Temp: 98.2 F (36.8 C) BP: 117/72 Pulse Rate: 85 SpO2: 98 %   Anthropometric Measurements Height: 5' 5 (1.651 m) Weight: 191 lb (86.6 kg) BMI (Calculated): 31.78 Weight at Last Visit: 195lb Weight Lost Since Last Visit: 4lb Weight Gained Since Last Visit: 0lb Starting Weight: 200lb Total Weight Loss (lbs): 9 lb (4.082 kg)   Body Composition  Body Fat %: 43 % Fat Mass (lbs): 82.4 lbs Muscle Mass (lbs): 103.4 lbs Total Body Water (lbs): 70.6 lbs Visceral Fat Rating : 12   Other Clinical Data Fasting: No Labs: No Today's Visit #: 4 Starting Date: 07/29/23     HPI  Chief Complaint: OBESITY  Jane Howard is here to discuss her progress with her obesity treatment plan. She is on the the Category 2 Plan and states she is following her eating plan approximately 100 % of the time. She states she is exercising 10 minutes 2 days per week.   Interval History:  Since last office visit she has lost 4 pounds.  She is doing well with the meal plan.  She would like to discuss more options for breakfast.  Occ notes some polyphagia and cravings.  She is drinking water, Gatorade zero, bai water and decaf black coffee with collagen. She is walking and playing mini bowling to stay active.    She is down 2 lbs of muscle mass and down 2 lbs of fat mass  Previous pharmacotherapy for medical weight loss:  Phentermine   Bariatric surgery:  Patient has not had bariatric surgery   Insulin  Resistance/prediabetes Last fasting insulin  was 17. A1c was 5.7. Polyphagia:Yes Medication(s): None  Lab Results  Component Value Date   HGBA1C 5.7 (H) 07/29/2023   HGBA1C 5.6% 02/01/2013   Lab Results  Component Value Date   INSULIN  17.0 07/29/2023       PHYSICAL EXAM:  Blood pressure 117/72, pulse 85, temperature 98.2 F (36.8  C), height 5' 5 (1.651 m), weight 191 lb (86.6 kg), SpO2 98%. Body mass index is 31.78 kg/m.  General: She is overweight, cooperative, alert, well developed, and in no acute distress. PSYCH: Has normal mood, affect and thought process.   Extremities: No edema.  Neurologic: No gross sensory or motor deficits. No tremors or fasciculations noted.    DIAGNOSTIC DATA REVIEWED:  BMET    Component Value Date/Time   NA 138 08/17/2023 1021   K 5.1 08/17/2023 1021   CL 101 08/17/2023 1021   CO2 20 08/17/2023 1021   GLUCOSE 101 (H) 08/17/2023 1021   GLUCOSE 114 (H) 11/16/2017 1608   BUN 31 (H) 08/17/2023 1021   CREATININE 1.01 (H) 08/17/2023 1021   CALCIUM  10.0 08/17/2023 1021   GFRNONAA 59 (L) 08/04/2019 1428   GFRAA 68 08/04/2019 1428   Lab Results  Component Value Date   HGBA1C 5.7 (H) 07/29/2023   HGBA1C 5.6% 02/01/2013   Lab Results  Component Value Date   INSULIN  17.0 07/29/2023   Lab Results  Component Value Date   TSH 0.933 07/29/2023   CBC    Component Value Date/Time   WBC 6.7 08/17/2023 1021   WBC 20.1 (H) 11/16/2017 1608   RBC 4.71 08/17/2023 1021   RBC 4.68 11/16/2017 1608   HGB 13.9 08/17/2023 1021   HCT 43.3 08/17/2023 1021   PLT 256 08/17/2023 1021  MCV 92 08/17/2023 1021   MCH 29.5 08/17/2023 1021   MCH 29.3 11/16/2017 1608   MCHC 32.1 08/17/2023 1021   MCHC 32.9 11/16/2017 1608   RDW 12.7 08/17/2023 1021   Iron Studies    Component Value Date/Time   IRON 51 11/27/2016 1709   TIBC 304 11/27/2016 1709   FERRITIN 299 (H) 11/27/2016 1709   IRONPCTSAT 17 11/27/2016 1709   Lipid Panel     Component Value Date/Time   CHOL 163 08/17/2023 1021   TRIG 131 08/17/2023 1021   TRIG 354 (H) 11/17/2013 1657   HDL 51 08/17/2023 1021   HDL 41 11/17/2013 1657   CHOLHDL 3.2 08/17/2023 1021   LDLCALC 89 08/17/2023 1021   LDLCALC 109 (H) 11/17/2013 1657   Hepatic Function Panel     Component Value Date/Time   PROT 7.2 08/17/2023 1021   ALBUMIN 4.5  08/17/2023 1021   AST 19 08/17/2023 1021   ALT 20 08/17/2023 1021   ALKPHOS 84 08/17/2023 1021   BILITOT 0.5 08/17/2023 1021      Component Value Date/Time   TSH 0.933 07/29/2023 1028   Nutritional Lab Results  Component Value Date   VD25OH 46.0 08/11/2022   VD25OH 15.4 (L) 11/27/2016     ASSESSMENT AND PLAN  TREATMENT PLAN FOR OBESITY:  Recommended Dietary Goals  Jane Howard is currently in the action stage of change. As such, her goal is to continue weight management plan. She has agreed to the Category 2 Plan.  Behavioral Intervention  We discussed the following Behavioral Modification Strategies today: increasing lean protein intake to established goals, decreasing simple carbohydrates , increasing vegetables, increasing fiber rich foods, increasing water intake , and continue to work on maintaining a reduced calorie state, getting the recommended amount of protein, incorporating whole foods, making healthy choices, staying well hydrated and practicing mindfulness when eating..  Additional resources provided today: NA  Recommended Physical Activity Goals  Jane Howard has been advised to work up to 150 minutes of moderate intensity aerobic activity a week and strengthening exercises 2-3 times per week for cardiovascular health, weight loss maintenance and preservation of muscle mass.   She has agreed to Think about enjoyable ways to increase daily physical activity and overcoming barriers to exercise, Increase physical activity in their day and reduce sedentary time (increase NEAT)., and continue to gradually increase the amount and intensity of exercise routine   ASSOCIATED CONDITIONS ADDRESSED TODAY  Action/Plan  Insulin  resistance Jane Howard will continue to work on weight loss, exercise, and decreasing simple carbohydrates to help decrease the risk of diabetes. Jane Howard agreed to follow-up with us  as directed to closely monitor her progress.   Prediabetes Jane Howard will  continue to work on weight loss, exercise, and decreasing simple carbohydrates to help decrease the risk of diabetes.    Obesity, Class I, BMI 30-34.9      Goals Start resistance training-showed youtube videos-[facility] Plans to start going to the [facility] in September.  States she needs to wait until her grandson is back in school.  Increase water intake   Return in about 3 weeks (around 09/21/2023).SABRA She was informed of the importance of frequent follow up visits to maximize her success with intensive lifestyle modifications for her multiple health conditions.   ATTESTASTION STATEMENTS:  Reviewed by clinician on day of visit: allergies, medications, problem list, medical history, surgical history, family history, social history, and previous encounter notes.   Time spent on visit including pre-visit chart review and post-visit care and  charting was 30 minutes.    Corean SAUNDERS. Marjorie Lussier FNP-C

## 2023-09-02 ENCOUNTER — Ambulatory Visit: Admitting: Nurse Practitioner

## 2023-09-28 ENCOUNTER — Encounter: Payer: Self-pay | Admitting: Nurse Practitioner

## 2023-09-28 ENCOUNTER — Ambulatory Visit (INDEPENDENT_AMBULATORY_CARE_PROVIDER_SITE_OTHER): Admitting: Nurse Practitioner

## 2023-09-28 VITALS — BP 126/77 | HR 72 | Temp 97.9°F | Ht 65.0 in | Wt 185.0 lb

## 2023-09-28 DIAGNOSIS — R7303 Prediabetes: Secondary | ICD-10-CM

## 2023-09-28 DIAGNOSIS — Z6830 Body mass index (BMI) 30.0-30.9, adult: Secondary | ICD-10-CM

## 2023-09-28 DIAGNOSIS — E88819 Insulin resistance, unspecified: Secondary | ICD-10-CM

## 2023-09-28 DIAGNOSIS — E66811 Obesity, class 1: Secondary | ICD-10-CM | POA: Diagnosis not present

## 2023-09-28 NOTE — Progress Notes (Signed)
 Office: [phone]  /  Fax: [phone]  WEIGHT SUMMARY AND BIOMETRICS  Weight Lost Since Last Visit: 6lb  Weight Gained Since Last Visit: 0lb   Vitals Temp: 97.9 F (36.6 C) BP: 126/77 Pulse Rate: 72 SpO2: 97 %   Anthropometric Measurements Height: 5' 5 (1.651 m) Weight: 185 lb (83.9 kg) BMI (Calculated): 30.79 Weight at Last Visit: 191lb Weight Lost Since Last Visit: 6lb Weight Gained Since Last Visit: 0lb Starting Weight: 200lb Total Weight Loss (lbs): 15 lb (6.804 kg)   Body Composition  Body Fat %: 42.5 % Fat Mass (lbs): 78.6 lbs Muscle Mass (lbs): 101 lbs Total Body Water (lbs): 70.6 lbs Visceral Fat Rating : 12   Other Clinical Data Fasting: Yes Labs: No Today's Visit #: 5 Starting Date: 07/29/23     HPI  Chief Complaint: OBESITY  Jane Howard is here to discuss her progress with her obesity treatment plan. She is on the the Category 2 Plan and states she is following her eating plan approximately 100 % of the time. She states she is exercising 30 minutes 2 days per week.   Interval History:  Since last office visit she has lost 6 pounds. She is tracking and is averaging around 1300-1500 calories and 60-70 grams of protein.  She is trying to drink 64 oz water, hot tea and decaf coffee daily and occ Snapple tea or G2.  She is exercising 2 days per [employer] (started after last visit) and bowling.  She is thinking about joining [employer] once her grandson goes back to school.  Occ notes polyphagia, denies cravings.    Current medication for weight loss:  None   Previous pharmacotherapy for medical weight loss:  Phentermine    Bariatric surgery:  Patient has not had bariatric surgery   Insulin  Resistance/Prediabetes Last fasting insulin  was 17. A1c was 5.7. Polyphagia:Yes Medication(s): None Lab Results  Component Value Date   HGBA1C 5.7 (H) 07/29/2023   HGBA1C 5.6% 02/01/2013   Lab Results  Component Value Date   INSULIN   17.0 07/29/2023      PHYSICAL EXAM:  Blood pressure 126/77, pulse 72, temperature 97.9 F (36.6 C), height 5' 5 (1.651 m), weight 185 lb (83.9 kg), SpO2 97%. Body mass index is 30.79 kg/m.  General: She is overweight, cooperative, alert, well developed, and in no acute distress. PSYCH: Has normal mood, affect and thought process.   Extremities: No edema.  Neurologic: No gross sensory or motor deficits. No tremors or fasciculations noted.    DIAGNOSTIC DATA REVIEWED:  BMET    Component Value Date/Time   NA 138 08/17/2023 1021   K 5.1 08/17/2023 1021   CL 101 08/17/2023 1021   CO2 20 08/17/2023 1021   GLUCOSE 101 (H) 08/17/2023 1021   GLUCOSE 114 (H) 11/16/2017 1608   BUN 31 (H) 08/17/2023 1021   CREATININE 1.01 (H) 08/17/2023 1021   CALCIUM  10.0 08/17/2023 1021   GFRNONAA 59 (L) 08/04/2019 1428   GFRAA 68 08/04/2019 1428   Lab Results  Component Value Date   HGBA1C 5.7 (H) 07/29/2023   HGBA1C 5.6% 02/01/2013   Lab Results  Component Value Date   INSULIN  17.0 07/29/2023   Lab Results  Component Value Date   TSH 0.933 07/29/2023   CBC    Component Value Date/Time   WBC 6.7 08/17/2023 1021   WBC 20.1 (H) 11/16/2017 1608   RBC 4.71 08/17/2023 1021   RBC 4.68 11/16/2017 1608   HGB 13.9 08/17/2023 1021  HCT 43.3 08/17/2023 1021   PLT 256 08/17/2023 1021   MCV 92 08/17/2023 1021   MCH 29.5 08/17/2023 1021   MCH 29.3 11/16/2017 1608   MCHC 32.1 08/17/2023 1021   MCHC 32.9 11/16/2017 1608   RDW 12.7 08/17/2023 1021   Iron Studies    Component Value Date/Time   IRON 51 11/27/2016 1709   TIBC 304 11/27/2016 1709   FERRITIN 299 (H) 11/27/2016 1709   IRONPCTSAT 17 11/27/2016 1709   Lipid Panel     Component Value Date/Time   CHOL 163 08/17/2023 1021   TRIG 131 08/17/2023 1021   TRIG 354 (H) 11/17/2013 1657   HDL 51 08/17/2023 1021   HDL 41 11/17/2013 1657   CHOLHDL 3.2 08/17/2023 1021   LDLCALC 89 08/17/2023 1021   LDLCALC 109 (H) 11/17/2013 1657    Hepatic Function Panel     Component Value Date/Time   PROT 7.2 08/17/2023 1021   ALBUMIN 4.5 08/17/2023 1021   AST 19 08/17/2023 1021   ALT 20 08/17/2023 1021   ALKPHOS 84 08/17/2023 1021   BILITOT 0.5 08/17/2023 1021      Component Value Date/Time   TSH 0.933 07/29/2023 1028   Nutritional Lab Results  Component Value Date   VD25OH 46.0 08/11/2022   VD25OH 15.4 (L) 11/27/2016     ASSESSMENT AND PLAN  TREATMENT PLAN FOR OBESITY:  Recommended Dietary Goals  Jane Howard is currently in the action stage of change. As such, her goal is to continue weight management plan. She has agreed to the Category 2 Plan.  Behavioral Intervention  We discussed the following Behavioral Modification Strategies today: increasing lean protein intake to established goals, decreasing simple carbohydrates , increasing vegetables, increasing fiber rich foods, increasing water intake , work on meal planning and preparation, reading food labels , keeping healthy foods at home, and continue to work on maintaining a reduced calorie state, getting the recommended amount of protein, incorporating whole foods, making healthy choices, staying well hydrated and practicing mindfulness when eating..  Additional resources provided today: NA  Recommended Physical Activity Goals  Jane Howard has been advised to work up to 150 minutes of moderate intensity aerobic activity a week and strengthening exercises 2-3 times per week for cardiovascular health, weight loss maintenance and preservation of muscle mass.   She has agreed to Think about enjoyable ways to increase daily physical activity and overcoming barriers to exercise, Increase physical activity in their day and reduce sedentary time (increase NEAT)., and continue to gradually increase the amount and intensity of exercise routine  Recommended that she start going to [employer].    ASSOCIATED CONDITIONS ADDRESSED TODAY  Action/Plan  Insulin   resistance Will recheck labs in October  Jane Howard will continue to work on weight loss, exercise, and decreasing simple carbohydrates. Jane Howard agreed to follow-up with us  as directed to closely monitor her progress.   Prediabetes Will recheck labs in October  Jane Howard will continue to work on weight loss, exercise, and decreasing simple carbohydrates.    Obesity, Class I, BMI 30-34.9     Goals: Start going to [employer] Continue youtube videos-add an extra day Increase water intake   Recheck labs in October   Return in about 4 weeks (around 10/26/2023).Jane Howard She was informed of the importance of frequent follow up visits to maximize her success with intensive lifestyle modifications for her multiple health conditions.   ATTESTASTION STATEMENTS:  Reviewed by clinician on day of visit: allergies, medications, problem list, medical history, surgical history,  family history, social history, and previous encounter notes.   Time spent on visit including pre-visit chart review and post-visit care and charting was 30 minutes on reviewing her chart, discussing exercise, meal plan, reviewing bioimpedance with the patient and behavorial interventions.     Jane Howard SAUNDERS. Jane Leamy FNP-C

## 2023-10-28 ENCOUNTER — Ambulatory Visit: Admitting: Nurse Practitioner

## 2023-10-28 ENCOUNTER — Encounter: Payer: Self-pay | Admitting: Nurse Practitioner

## 2023-10-28 VITALS — BP 123/74 | HR 72 | Temp 97.9°F | Ht 65.0 in | Wt 178.0 lb

## 2023-10-28 DIAGNOSIS — E669 Obesity, unspecified: Secondary | ICD-10-CM | POA: Diagnosis not present

## 2023-10-28 DIAGNOSIS — E88819 Insulin resistance, unspecified: Secondary | ICD-10-CM | POA: Diagnosis not present

## 2023-10-28 DIAGNOSIS — Z6829 Body mass index (BMI) 29.0-29.9, adult: Secondary | ICD-10-CM | POA: Diagnosis not present

## 2023-10-28 NOTE — Progress Notes (Signed)
 Office: [phone]  /  Fax: [phone]  WEIGHT SUMMARY AND BIOMETRICS  Weight Lost Since Last Visit: 7lb  Weight Gained Since Last Visit: 0lb   Vitals Temp: 97.9 F (36.6 C) BP: 123/74 Pulse Rate: 72 SpO2: 97 %   Anthropometric Measurements Height: 5' 5 (1.651 m) Weight: 178 lb (80.7 kg) BMI (Calculated): 29.62 Weight at Last Visit: 185lb Weight Lost Since Last Visit: 7lb Weight Gained Since Last Visit: 0lb Starting Weight: 200lb Total Weight Loss (lbs): 22 lb (9.979 kg)   Body Composition  Body Fat %: 41.3 % Fat Mass (lbs): 73.6 lbs Muscle Mass (lbs): 99.2 lbs Total Body Water (lbs): 69.6 lbs Visceral Fat Rating : 11   Other Clinical Data Fasting: Yes Labs: No Today's Visit #: 6 Starting Date: 07/29/23     HPI  Chief Complaint: OBESITY  Brookley is here to discuss her progress with her obesity treatment plan. She is on the the Category 2 Plan and states she is following her eating plan approximately 90 % of the time. She states she is exercising 45 minutes 3 days per week.   Interval History:  Since last office visit she has lost 7 pounds.  She is not skipping meals.  She is averaging around 1300 calories. She has increased her protein intake and is averaging around 90-100 grams of protein. She eats out several days per week.  When she eats out, she makes sure she eat a protein and vegetables. She occ notes polyphagia and cravings.   She is snacking on quest chips, protein bars, premier protein shakes.  She is drinking water, vitamin water, G2 and Bai water.  She joined the [employer] and is going 1 day per week.  She is planning to start going 2 days per week and is walking 2 days per week. She reports that she feels good.   Current medication for weight loss:  None    Previous pharmacotherapy for medical weight loss:  Phentermine    Bariatric surgery:  Patient has not had bariatric surgery   Insulin  Resistance Last fasting insulin  was 17. A1c was  5.7. Polyphagia:Yes but has improved Medication(s): None Lab Results  Component Value Date   HGBA1C 5.7 (H) 07/29/2023   HGBA1C 5.6% 02/01/2013   Lab Results  Component Value Date   INSULIN  17.0 07/29/2023     PHYSICAL EXAM:  Blood pressure 123/74, pulse 72, temperature 97.9 F (36.6 C), height 5' 5 (1.651 m), weight 178 lb (80.7 kg), SpO2 97%. Body mass index is 29.62 kg/m.  General: She is overweight, cooperative, alert, well developed, and in no acute distress. PSYCH: Has normal mood, affect and thought process.   Extremities: No edema.  Neurologic: No gross sensory or motor deficits. No tremors or fasciculations noted.    DIAGNOSTIC DATA REVIEWED:  BMET    Component Value Date/Time   NA 138 08/17/2023 1021   K 5.1 08/17/2023 1021   CL 101 08/17/2023 1021   CO2 20 08/17/2023 1021   GLUCOSE 101 (H) 08/17/2023 1021   GLUCOSE 114 (H) 11/16/2017 1608   BUN 31 (H) 08/17/2023 1021   CREATININE 1.01 (H) 08/17/2023 1021   CALCIUM  10.0 08/17/2023 1021   GFRNONAA 59 (L) 08/04/2019 1428   GFRAA 68 08/04/2019 1428   Lab Results  Component Value Date   HGBA1C 5.7 (H) 07/29/2023   HGBA1C 5.6% 02/01/2013   Lab Results  Component Value Date   INSULIN  17.0 07/29/2023   Lab Results  Component Value Date  TSH 0.933 07/29/2023   CBC    Component Value Date/Time   WBC 6.7 08/17/2023 1021   WBC 20.1 (H) 11/16/2017 1608   RBC 4.71 08/17/2023 1021   RBC 4.68 11/16/2017 1608   HGB 13.9 08/17/2023 1021   HCT 43.3 08/17/2023 1021   PLT 256 08/17/2023 1021   MCV 92 08/17/2023 1021   MCH 29.5 08/17/2023 1021   MCH 29.3 11/16/2017 1608   MCHC 32.1 08/17/2023 1021   MCHC 32.9 11/16/2017 1608   RDW 12.7 08/17/2023 1021   Iron Studies    Component Value Date/Time   IRON 51 11/27/2016 1709   TIBC 304 11/27/2016 1709   FERRITIN 299 (H) 11/27/2016 1709   IRONPCTSAT 17 11/27/2016 1709   Lipid Panel     Component Value Date/Time   CHOL 163 08/17/2023 1021   TRIG  131 08/17/2023 1021   TRIG 354 (H) 11/17/2013 1657   HDL 51 08/17/2023 1021   HDL 41 11/17/2013 1657   CHOLHDL 3.2 08/17/2023 1021   LDLCALC 89 08/17/2023 1021   LDLCALC 109 (H) 11/17/2013 1657   Hepatic Function Panel     Component Value Date/Time   PROT 7.2 08/17/2023 1021   ALBUMIN 4.5 08/17/2023 1021   AST 19 08/17/2023 1021   ALT 20 08/17/2023 1021   ALKPHOS 84 08/17/2023 1021   BILITOT 0.5 08/17/2023 1021      Component Value Date/Time   TSH 0.933 07/29/2023 1028   Nutritional Lab Results  Component Value Date   VD25OH 46.0 08/11/2022   VD25OH 15.4 (L) 11/27/2016     ASSESSMENT AND PLAN  TREATMENT PLAN FOR OBESITY:  Recommended Dietary Goals  Hokulani is currently in the action stage of change. As such, her goal is to continue weight management plan. She has agreed to keeping a food journal and adhering to recommended goals of 1400 calories and 90 protein.  Behavioral Intervention  We discussed the following Behavioral Modification Strategies today: increasing lean protein intake to established goals, decreasing simple carbohydrates , increasing vegetables, increasing fiber rich foods, increasing water intake , work on meal planning and preparation, reading food labels , keeping healthy foods at home, planning for success, continue to work on maintaining a reduced calorie state, getting the recommended amount of protein, incorporating whole foods, making healthy choices, staying well hydrated and practicing mindfulness when eating., and increase protein intake, fibrous foods (25 grams per day for women, 30 grams for men) and water to improve satiety and decrease hunger signals. .  Additional resources provided today: NA  Recommended Physical Activity Goals  Akaysha has been advised to work up to 150 minutes of moderate intensity aerobic activity a week and strengthening exercises 2-3 times per week for cardiovascular health, weight loss maintenance and  preservation of muscle mass.   She has agreed to Think about enjoyable ways to increase daily physical activity and overcoming barriers to exercise, Increase physical activity in their day and reduce sedentary time (increase NEAT)., Continue to gradually increase the amount and intensity of exercise routine, and Combine aerobic and strengthening exercises for efficiency and improved cardiometabolic health.    ASSOCIATED CONDITIONS ADDRESSED TODAY  Action/Plan  Insulin  resistance Yachet will continue to work on weight loss, exercise, and decreasing simple carbohydrates to help decrease the risk of diabetes. Apollonia agreed to follow-up with us  as directed to closely monitor her progress.   Will recheck labs at next visit.   She has done well with weight loss  Generalized obesity  BMI 29.0-29.9,adult      Will obtain labs at next visit per patient request.    Return in about 4 weeks (around 11/25/2023).SABRA She was informed of the importance of frequent follow up visits to maximize her success with intensive lifestyle modifications for her multiple health conditions.   ATTESTASTION STATEMENTS:  Reviewed by clinician on day of visit: allergies, medications, problem list, medical history, surgical history, family history, social history, and previous encounter notes.   I personally spent a total of 36 minutes in the care of the patient today including preparing to see the patient, getting/reviewing separately obtained history, performing a medically appropriate exam/evaluation, counseling and educating, and documenting clinical information in the EHR.    Corean SAUNDERS. Maicol Bowland FNP-C

## 2023-11-02 ENCOUNTER — Ambulatory Visit: Admitting: Obstetrics and Gynecology

## 2023-11-17 ENCOUNTER — Telehealth: Payer: Self-pay

## 2023-11-17 MED ORDER — CIPROFLOXACIN HCL 500 MG PO TABS: ORAL_TABLET | ORAL | 0 refills | Status: DC

## 2023-11-17 NOTE — Telephone Encounter (Signed)
 Jane Howard is called to provide pre-procedural instructions for her [] Cystoscopy, [] Bladder Botox or [x] Urethral Bulking. She is scheduled on 11/18/2023.  The patient is not having worsening urinary symptoms to her urinary patterns. The patient is not experiencing any UTI symptoms. [] The patient is scheduled to come in 2-3 days prior for a non-provider visit to run a POC Urinalysis.  [] The patient cannot come in for a pre-procedural Urinalysis therefore discussed with provider to determine treatment prior to procedure.   [] The patient has been prescribed the pre-procedural antibiotics. The patient is reminded to take the prescribed antibiotics the morning of the procedure, an hour before and the morning of the following day.   [] The patient has not previously been prescribed the pre-procedural anitbiotics, review with patient her medication allergies to determine the antibiotic prescription needed.  [] Since no allergies, a prescription forCipro 500mg  #2, take 1 the morning of the procedure and 1 the morning after the procedure and one the morning after the procedure.  [] Since the patient is allergic to Sulfa , the patient will be prescribed Macrobid 100mg  #2, take 1 the morning of the procedure and 1 the morning after the procedure.    The patient is reminded to arrive 5 minutes prior to the scheduled appointment time and that a urine sample will need to be collected the upon arrival for the procedure.

## 2023-11-18 ENCOUNTER — Ambulatory Visit: Admitting: Obstetrics

## 2023-11-18 ENCOUNTER — Encounter: Payer: Self-pay | Admitting: Obstetrics

## 2023-11-18 VITALS — BP 117/65 | HR 77

## 2023-11-18 DIAGNOSIS — N3946 Mixed incontinence: Secondary | ICD-10-CM

## 2023-11-18 DIAGNOSIS — N393 Stress incontinence (female) (male): Secondary | ICD-10-CM | POA: Diagnosis not present

## 2023-11-18 LAB — POCT URINALYSIS DIP (CLINITEK)
Bilirubin, UA: NEGATIVE
Blood, UA: NEGATIVE
Glucose, UA: NEGATIVE mg/dL
Ketones, POC UA: NEGATIVE mg/dL
Leukocytes, UA: NEGATIVE
Nitrite, UA: NEGATIVE
POC PROTEIN,UA: NEGATIVE
Spec Grav, UA: 1.025 (ref 1.010–1.025)
Urobilinogen, UA: 0.2 U/dL
pH, UA: 6 (ref 5.0–8.0)

## 2023-11-18 NOTE — Progress Notes (Signed)
 Bulkamid Injection  CC: 68 y.o. y.o. F with stress dominant mixed urinary incontinence who presents for transurethral Bulkamid injection.  Changed to 2 3/4in long stem gellhorn since 07/22/23, denies bulge symptoms. Denies discharge or bleeding Drinking more water to lose weight, lost 5lbs SUI 1-2x/day  UUI 1x/day if she waits too long to void after urge Using vaginal estrogen 2x/week ($70/tube) Uses metamucil gummies 2-3x/week.   Urodynamic Impression 08/10/23:  1. Sensation was normal; capacity was normal 2. Stress Incontinence was demonstrated at normal pressures; 3. Detrusor Overactivity was not demonstrated. 4. Emptying was dysfunctional with a elevated PVR and intermittent pattern, a sustained detrusor contraction present,  abdominal straining not present, normal urethral sphincter activity on EMG. Pdet at Qmax was 23 cm of water.  Qmax was 13 mL/sec.  Patient signed her consent form.  She started antibiotic prophylaxis today.  Today's Vitals   11/18/23 1110  BP: 117/65  Pulse: 77    Results for orders placed or performed in visit on 11/18/23 (from the past 24 hours)  POCT URINALYSIS DIP (CLINITEK)     Status: None   Collection Time: 11/18/23  2:40 PM  Result Value Ref Range   Color, UA yellow yellow   Clarity, UA clear clear   Glucose, UA negative negative mg/dL   Bilirubin, UA negative negative   Ketones, POC UA negative negative mg/dL   Spec Grav, UA 8.974 8.989 - 1.025   Blood, UA negative negative   pH, UA 6.0 5.0 - 8.0   POC PROTEIN,UA negative negative, trace   Urobilinogen, UA 0.2 0.2 or 1.0 E.U./dL   Nitrite, UA Negative Negative   Leukocytes, UA Negative Negative    Procedure: Time out was performed. The bladder was catheterized and 10 ml of 2% lidocaine jelly placed in the urethra. A urethral block was performed by injecting 3ml of 1% lidocaine with epinephrine at 3 and 9 o'clock adjacent to the urethra.  The needle was primed.  The cystoscope  was inserted to the level of the bladder neck.  The needle was inserted 2 cm and the scope was pulled back into the urethra 2 cm.  The needle was inserted bevel up at the 5 o'clock position and the Bulkamid was injected to obtain coaptation.  This was repeated at the 2 o'clock,  10 o'clock and 7 o'clock positions.   A total of 2- 1ml syringes were used and good circumferential coaptation was noted.  The patient tolerated the procedure well. She was asked to void after the procedure.  Post-Void Residual (PVR) by Bladder Scan: In order to evaluate bladder emptying, we discussed obtaining a postvoid residual and she agreed to this procedure.  Procedure: The ultrasound unit was placed on the patient's abdomen in the suprapubic region after the patient had voided. A PVR of 43 ml was obtained by bladder scan.  Pelvic Exam: Normal external female genitalia; Bartholin's and Skene's glands normal in appearance; urethral meatus normal in appearance, no urethral masses or discharge. The 2 3/4in long stem gellhorn pessary was noted to be in place. It was removed and cleaned. Speculum exam revealed no lesions in the vagina. The pessary was replaced. It was comfortable to the patient and fit well.    Post Void Residual - 11/18/23 1212       Post Void Residual   Post Void Residual 43 mL           ASSESSMENT: 68 y.o. y.o. s/p transurethral Bulkamid injection for stress incontinence.   -  return in 3 months for pessary check - continue vaginal estrogen  PLAN: Patient will follow up in 4 weeks to reassess. Voiding and post-procedure precautions were given. She will return for heavy bleeding, fevers, dysuria lasting beyond today and incomplete emptying.  All questions were answered.  Lianne ONEIDA Gillis, MD

## 2023-11-18 NOTE — Patient Instructions (Signed)
 Taking Care of Yourself after Urodynamics, Cystoscopy, Bulkamid Injection, or Botox Injection   Drink plenty of water for a day or two following your procedure. Try to have about 8 ounces (one cup) at a time, and do this 6 times or more per day unless you have fluid restrictitons AVOID irritative beverages such as coffee, tea, soda, alcoholic or citrus drinks for a day or two, as this may cause burning with urination.  For the first 1-2 days after the procedure, your urine may be pink or red in color. You may have some blood in your urine as a normal side effect of the procedure. Large amounts of bleeding or difficulty urinating are NOT normal. Call the nurse line if this happens or go to the nearest Emergency Room if the bleeding is heavy or you cannot urinate at all and it is after hours. If you had a Bulkamid injection in the urethra and need to be catheterized, ask for a pediatric catheter to be used (size 10 or 12-French) so the material is not pushed out of place.   You may experience some discomfort or a burning sensation with urination after having this procedure. You can use over the counter Azo or pyridium  to help with burning and follow the instructions on the packaging. If it does not improve within 1-2 days, or other symptoms appear (fever, chills, or difficulty urinating) call the office to speak to a nurse.  You may return to normal daily activities such as work, school, driving, exercising and housework on the day of the procedure. If your doctor gave you a prescription, take it as ordered.    Continue vaginal estrogen 1g twice a week and metamucil use.

## 2023-12-02 ENCOUNTER — Ambulatory Visit: Admitting: Nurse Practitioner

## 2023-12-02 ENCOUNTER — Encounter: Payer: Self-pay | Admitting: Nurse Practitioner

## 2023-12-02 VITALS — BP 123/74 | HR 68 | Temp 98.0°F | Ht 65.0 in | Wt 174.0 lb

## 2023-12-02 DIAGNOSIS — Z79899 Other long term (current) drug therapy: Secondary | ICD-10-CM

## 2023-12-02 DIAGNOSIS — R7303 Prediabetes: Secondary | ICD-10-CM

## 2023-12-02 DIAGNOSIS — E782 Mixed hyperlipidemia: Secondary | ICD-10-CM

## 2023-12-02 DIAGNOSIS — E669 Obesity, unspecified: Secondary | ICD-10-CM

## 2023-12-02 DIAGNOSIS — Z6828 Body mass index (BMI) 28.0-28.9, adult: Secondary | ICD-10-CM

## 2023-12-02 DIAGNOSIS — R748 Abnormal levels of other serum enzymes: Secondary | ICD-10-CM | POA: Diagnosis not present

## 2023-12-02 DIAGNOSIS — E559 Vitamin D deficiency, unspecified: Secondary | ICD-10-CM

## 2023-12-02 NOTE — Progress Notes (Signed)
 Office: [phone]  /  Fax: [phone]  WEIGHT SUMMARY AND BIOMETRICS  Weight Lost Since Last Visit: 4lb  Weight Gained Since Last Visit: 0lb   Vitals Temp: 98 F (36.7 C) BP: 123/74 Pulse Rate: 68 SpO2: 98 %   Anthropometric Measurements Height: 5' 5 (1.651 m) Weight: 174 lb (78.9 kg) BMI (Calculated): 28.96 Weight at Last Visit: 178lb Weight Lost Since Last Visit: 4lb Weight Gained Since Last Visit: 0lb Starting Weight: 200lb Total Weight Loss (lbs): 26 lb (11.8 kg)   Body Composition  Body Fat %: 40.5 % Fat Mass (lbs): 70.6 lbs Muscle Mass (lbs): 98.4 lbs Total Body Water (lbs): 68.8 lbs Visceral Fat Rating : 11   Other Clinical Data Fasting: Yes Labs: No Today's Visit #: 7 Starting Date: 07/29/23     HPI  Chief Complaint: OBESITY  Jane Howard is here to discuss her progress with her obesity treatment plan. She is on the the Category 2 Plan and states she is following her eating plan approximately 90 % of the time. She states she is exercising 60+ minutes 1-2 days per week.   Interval History:  Since last office visit she has lost 4 pounds. She is averaging around 1300 calories and 90-100 grams of protein.  She is drinking water, vitamin water, G2, decaf coffee and Bai water.  She is going to the [employer] 1-2 days per week-Silver sneakers and resistance training. Her son in law is a Systems analyst and is helping her to come up with a training plan.   Current medication for weight loss:  None    Previous pharmacotherapy for medical weight loss:  Phentermine    Bariatric surgery:  Patient has not had bariatric surgery   Elevated creat Denies OTC NSAIDS-stopped taking after last labs Last alcohol intake was over 2 months ago  Prediabetes Last A1c was 5.7 and fasting insulin  was 17.0  Medication(s): none Polyphagia:Yes Lab Results  Component Value Date   HGBA1C 5.7 (H) 07/29/2023   HGBA1C 5.6% 02/01/2013   Lab Results  Component Value Date    INSULIN  17.0 07/29/2023   Vit D deficiency  She is taking Vit D and calcium  OTC  Denies side effects.  Denies nausea, vomiting or muscle weakness.    Lab Results  Component Value Date   VD25OH 46.0 08/11/2022   VD25OH 15.4 (L) 11/27/2016     Hyperlipidemia Medication(s): Lipitor 20mg . Denies side effects.   Cardiovascular risk factors: advanced age (older than 68 for men, 63 for women), dyslipidemia, and hypertension  Lab Results  Component Value Date   CHOL 163 08/17/2023   HDL 51 08/17/2023   LDLCALC 89 08/17/2023   TRIG 131 08/17/2023   CHOLHDL 3.2 08/17/2023   Lab Results  Component Value Date   ALT 20 08/17/2023   AST 19 08/17/2023   ALKPHOS 84 08/17/2023   BILITOT 0.5 08/17/2023   The 10-year ASCVD risk score (Arnett DK, et al., 2019) is: 8%   Values used to calculate the score:     Age: 68 years     Clincally relevant sex: Female     Is Non-Hispanic African American: No     Diabetic: No     Tobacco smoker: No     Systolic Blood Pressure: 123 mmHg     Is BP treated: Yes     HDL Cholesterol: 51 mg/dL     Total Cholesterol: 163 mg/dL    PHYSICAL EXAM:  Blood pressure 123/74, pulse 68, temperature 98 F (36.7  C), height 5' 5 (1.651 m), weight 174 lb (78.9 kg), SpO2 98%. Body mass index is 28.96 kg/m.  General: She is overweight, cooperative, alert, well developed, and in no acute distress. PSYCH: Has normal mood, affect and thought process.   Extremities: No edema.  Neurologic: No gross sensory or motor deficits. No tremors or fasciculations noted.    DIAGNOSTIC DATA REVIEWED:  BMET    Component Value Date/Time   NA 138 08/17/2023 1021   K 5.1 08/17/2023 1021   CL 101 08/17/2023 1021   CO2 20 08/17/2023 1021   GLUCOSE 101 (H) 08/17/2023 1021   GLUCOSE 114 (H) 11/16/2017 1608   BUN 31 (H) 08/17/2023 1021   CREATININE 1.01 (H) 08/17/2023 1021   CALCIUM  10.0 08/17/2023 1021   GFRNONAA 59 (L) 08/04/2019 1428   GFRAA 68 08/04/2019 1428   Lab  Results  Component Value Date   HGBA1C 5.7 (H) 07/29/2023   HGBA1C 5.6% 02/01/2013   Lab Results  Component Value Date   INSULIN  17.0 07/29/2023   Lab Results  Component Value Date   TSH 0.933 07/29/2023   CBC    Component Value Date/Time   WBC 6.7 08/17/2023 1021   WBC 20.1 (H) 11/16/2017 1608   RBC 4.71 08/17/2023 1021   RBC 4.68 11/16/2017 1608   HGB 13.9 08/17/2023 1021   HCT 43.3 08/17/2023 1021   PLT 256 08/17/2023 1021   MCV 92 08/17/2023 1021   MCH 29.5 08/17/2023 1021   MCH 29.3 11/16/2017 1608   MCHC 32.1 08/17/2023 1021   MCHC 32.9 11/16/2017 1608   RDW 12.7 08/17/2023 1021   Iron Studies    Component Value Date/Time   IRON 51 11/27/2016 1709   TIBC 304 11/27/2016 1709   FERRITIN 299 (H) 11/27/2016 1709   IRONPCTSAT 17 11/27/2016 1709   Lipid Panel     Component Value Date/Time   CHOL 163 08/17/2023 1021   TRIG 131 08/17/2023 1021   TRIG 354 (H) 11/17/2013 1657   HDL 51 08/17/2023 1021   HDL 41 11/17/2013 1657   CHOLHDL 3.2 08/17/2023 1021   LDLCALC 89 08/17/2023 1021   LDLCALC 109 (H) 11/17/2013 1657   Hepatic Function Panel     Component Value Date/Time   PROT 7.2 08/17/2023 1021   ALBUMIN 4.5 08/17/2023 1021   AST 19 08/17/2023 1021   ALT 20 08/17/2023 1021   ALKPHOS 84 08/17/2023 1021   BILITOT 0.5 08/17/2023 1021      Component Value Date/Time   TSH 0.933 07/29/2023 1028   Nutritional Lab Results  Component Value Date   VD25OH 46.0 08/11/2022   VD25OH 15.4 (L) 11/27/2016     ASSESSMENT AND PLAN  TREATMENT PLAN FOR OBESITY:  Recommended Dietary Goals  Jane Howard is currently in the action stage of change. As such, her goal is to continue weight management plan. She has agreed to keeping a food journal and adhering to recommended goals of 1400 calories and 90 protein.    Behavioral Intervention  We discussed the following Behavioral Modification Strategies today: increasing lean protein intake to established goals,  decreasing simple carbohydrates , increasing vegetables, increasing fiber rich foods, avoiding skipping meals, increasing water intake , reading food labels , keeping healthy foods at home, continue to practice mindfulness when eating, planning for success, continue to work on maintaining a reduced calorie state, getting the recommended amount of protein, incorporating whole foods, making healthy choices, staying well hydrated and practicing mindfulness when eating., and increase protein  intake, fibrous foods (25 grams per day for women, 30 grams for men) and water to improve satiety and decrease hunger signals. .  Additional resources provided today: NA  Recommended Physical Activity Goals  Latarshia has been advised to work up to 150 minutes of moderate intensity aerobic activity a week and strengthening exercises 2-3 times per week for cardiovascular health, weight loss maintenance and preservation of muscle mass.   She has agreed to Think about enjoyable ways to increase daily physical activity and overcoming barriers to exercise, Increase physical activity in their day and reduce sedentary time (increase NEAT)., Start strengthening exercises with a goal of 2-3 sessions a week , Continue to gradually increase the amount and intensity of exercise routine, Increase volume of physical activity to a goal of 240 minutes a week, and Combine aerobic and strengthening exercises for efficiency and improved cardiometabolic health.   ASSOCIATED CONDITIONS ADDRESSED TODAY  Action/Plan  Elevated creatine kinase -     Comprehensive metabolic panel with GFR  Prediabetes -     Hemoglobin A1c -     Insulin , random  Vitamin D  deficiency -     VITAMIN D  25 Hydroxy (Vit-D Deficiency, Fractures)  Mixed hyperlipidemia -     Lipid Panel With LDL/HDL Ratio  Managed by PCP.   Continue Lipitor as directed.   Medication management -     Comprehensive metabolic panel with GFR  Generalized obesity  BMI  28.0-28.9,adult Patient has overall done well with weight loss.       Goals:  Increase resistance training and core exercises     Return in about 8 weeks (around 01/27/2024).SABRA She was informed of the importance of frequent follow up visits to maximize her success with intensive lifestyle modifications for her multiple health conditions.   ATTESTASTION STATEMENTS:  Reviewed by clinician on day of visit: allergies, medications, problem list, medical history, surgical history, family history, social history, and previous encounter notes.     Corean SAUNDERS. Shaarav Ripple FNP-C

## 2023-12-03 LAB — COMPREHENSIVE METABOLIC PANEL WITH GFR
ALT: 18 IU/L (ref 0–32)
AST: 20 IU/L (ref 0–40)
Albumin: 4.3 g/dL (ref 3.9–4.9)
Alkaline Phosphatase: 92 IU/L (ref 49–135)
BUN/Creatinine Ratio: 34 — ABNORMAL HIGH (ref 12–28)
BUN: 30 mg/dL — ABNORMAL HIGH (ref 8–27)
Bilirubin Total: 0.4 mg/dL (ref 0.0–1.2)
CO2: 23 mmol/L (ref 20–29)
Calcium: 10.3 mg/dL (ref 8.7–10.3)
Chloride: 102 mmol/L (ref 96–106)
Creatinine, Ser: 0.88 mg/dL (ref 0.57–1.00)
Globulin, Total: 2.9 g/dL (ref 1.5–4.5)
Glucose: 103 mg/dL — ABNORMAL HIGH (ref 70–99)
Potassium: 4.8 mmol/L (ref 3.5–5.2)
Sodium: 139 mmol/L (ref 134–144)
Total Protein: 7.2 g/dL (ref 6.0–8.5)
eGFR: 72 mL/min/1.73 (ref >59)

## 2023-12-03 LAB — LIPID PANEL WITH LDL/HDL RATIO
Cholesterol, Total: 184 mg/dL (ref 100–199)
HDL: 55 mg/dL (ref >39)
LDL Chol Calc (NIH): 108 mg/dL — ABNORMAL HIGH (ref 0–99)
LDL/HDL Ratio: 2 ratio (ref 0.0–3.2)
Triglycerides: 118 mg/dL (ref 0–149)
VLDL Cholesterol Cal: 21 mg/dL (ref 5–40)

## 2023-12-03 LAB — HEMOGLOBIN A1C
Est. average glucose Bld gHb Est-mCnc: 120 mg/dL
Hgb A1c MFr Bld: 5.8 % — ABNORMAL HIGH (ref 4.8–5.6)

## 2023-12-03 LAB — VITAMIN D 25 HYDROXY (VIT D DEFICIENCY, FRACTURES): Vit D, 25-Hydroxy: 85.6 ng/mL (ref 30.0–100.0)

## 2023-12-03 LAB — INSULIN, RANDOM: INSULIN: 14 u[IU]/mL (ref 2.6–24.9)

## 2023-12-21 ENCOUNTER — Encounter: Payer: Self-pay | Admitting: Obstetrics

## 2023-12-21 ENCOUNTER — Ambulatory Visit (INDEPENDENT_AMBULATORY_CARE_PROVIDER_SITE_OTHER): Admitting: Obstetrics

## 2023-12-21 VITALS — BP 125/57 | HR 75

## 2023-12-21 DIAGNOSIS — Z960 Presence of urogenital implants: Secondary | ICD-10-CM

## 2023-12-21 DIAGNOSIS — N952 Postmenopausal atrophic vaginitis: Secondary | ICD-10-CM

## 2023-12-21 DIAGNOSIS — N3946 Mixed incontinence: Secondary | ICD-10-CM

## 2023-12-21 DIAGNOSIS — R3914 Feeling of incomplete bladder emptying: Secondary | ICD-10-CM

## 2023-12-21 MED ORDER — TROSPIUM CHLORIDE ER 60 MG PO CP24: 1.0000 | ORAL_CAPSULE | Freq: Every evening | ORAL | 2 refills | Status: DC

## 2023-12-21 NOTE — Addendum Note (Signed)
 Addended byBETHA GUADLUPE DULL T on: 12/21/2023 01:23 PM   Modules accepted: Orders

## 2023-12-21 NOTE — Progress Notes (Signed)
 [facility] Return Visit  SUBJECTIVE  History of Present Illness: Jane Howard is a 68 y.o. female seen in follow-up for Stage III pelvic organ prolapse, mixed urinary incontinence, constipation, sensation of incomplete emptying, vaginal atrophy, history of anterior colporrhaphy, obesity, and pessary use. Plan at last visit was fluid management, caffeine reduction, low dose vaginal estrogen, fiber supplementation, squatting position for defecation, continue short stem 2 1/2 gellhorn pessary use.   S/p Bulkamid injection 11/18/23 Urinary frequency reduced after recent UTI treatment SUI resolved since bulkamid Using 2 3/4in long stem gellhorn since 07/22/23, denies bulge symptoms. Last pessary check 11/18/23 at the time of urethral bulking. Denies discharge or bleeding Drinking more water to lose weight, net lost 25lbs and exercises regularly UUI 1x/day if she waits too long to void after urge or with first void in the morning Tries to avoid fluid intake around 8pm, sleeps around 10pm Reports snoring, denies sleep apnea Using vaginal estrogen 2x/week ($70/tube with insurance, now with [employer]), desires to increase due to improvement of UTI symptoms when she uses vaginal estrogen Uses metamucil gummies 2-3x/week.   Urodynamic Impression 08/10/23:  1. Sensation was normal; capacity was normal 2. Stress Incontinence was demonstrated at normal pressures; 3. Detrusor Overactivity was not demonstrated. 4. Emptying was dysfunctional with a elevated PVR and intermittent pattern, a sustained detrusor contraction present,  abdominal straining not present, normal urethral sphincter activity on EMG. Pdet at Qmax was 23 cm of water.  Qmax was 13 mL/sec.    Past Medical History: Patient  has a past medical history of Anxiety, Back pain, Chest pain, Constipation, Depression, Edema, GERD (gastroesophageal reflux disease), High blood pressure, High cholesterol, Joint pain,  Nephrolithiasis, Palpitations, and Vitamin D  deficiency.   Past Surgical History: She  has a past surgical history that includes Tonsillectomy; Foot surgery (Bilateral); Abdominal hysterectomy; and Nasal septum surgery.   Medications: She has a current medication list which includes the following prescription(s): allopurinol , aspirin ec, atorvastatin , calcium  carb-cholecalciferol, vitamin d3, ciprofloxacin , clonazepam , escitalopram , estradiol , fluticasone , hydrochlorothiazide , lisinopril , multiple vitamin, multiple vitamins-minerals, probiotic product, psyllium, and trospium chloride.   Allergies: Patient is allergic to amoxicillin , macrodantin [nitrofurantoin macrocrystal], and vicodin [hydrocodone -acetaminophen ].   Social History: Patient  reports that she has never smoked. She has never used smokeless tobacco. She reports current alcohol use. She reports that she does not use drugs.     OBJECTIVE     Physical Exam: Vitals:   12/21/23 1250  BP: (!) 125/57  Pulse: 75   Gen: No apparent distress, A&O x 3.  Detailed Urogynecologic Evaluation:  Deferred. Prior exam showed:   Post Void Residual - 12/21/23 1252       Post Void Residual   Post Void Residual 8 mL           ASSESSMENT AND PLAN    Jane Howard is a 68 y.o. with:  1. Urinary incontinence, mixed   2. Presence of pessary   3. Vaginal atrophy   4. Feeling of incomplete bladder emptying      Urinary incontinence, mixed Assessment & Plan: - 04/02/23 POCT UA negative, bladder scan . Repeat with 2 3/4in long stem gellhorn pessary and 8mL on repeat today - urgency > stress prior to pessary refitting, now 1x/day with 1st void - SUI resolved after bulkamid 11/18/23 - We discussed the symptoms of overactive bladder (OAB), which include urinary urgency, urinary frequency, nocturia, with or without urge incontinence.  While we do not know the exact etiology  of OAB, several treatment options exist. We discussed  management including behavioral therapy (decreasing bladder irritants, urge suppression strategies, timed voids, bladder retraining), physical therapy, medication; for refractory cases posterior tibial nerve stimulation, sacral neuromodulation, and intravesical botulinum toxin injection.  For anticholinergic medications, we discussed the potential side effects of anticholinergics including dry eyes, dry mouth, constipation, cognitive impairment and urinary retention. For Beta-3 agonist medication, we discussed the potential side effect of elevated blood pressure which is more likely to occur in individuals with uncontrolled hypertension. - encouraged to continue fluid management, caffeine reduction - Rx to start Trospium 60mg  XL, pt to call if it is cost prohibitive - For treatment of stress urinary incontinence,  non-surgical options include expectant management, weight loss, physical therapy, as well as a pessary.  Surgical options include a midurethral sling, Burch urethropexy, and transurethral injection of a bulking agent. - continue low dose vaginal estrogen, increase to 3x/week - encouraged Kegel exercises  Orders: -     Trospium Chloride ER; Take 1 capsule (60 mg total) by mouth at bedtime.  Dispense: 30 capsule; Refill: 2  Presence of pessary Assessment & Plan: - discussed risk of change in urinary or bowel symptoms, vaginal ulceration, discharge, bleeding, fistula formation. Explained that pt may require multiple sizes and types for fitting.  - pt reports discomfort at time of pessary management, encouraged pt to use topical lidocaine at the time of pessary management to reduce discomfort - increase vaginal estrogen 1g 3x/week for therapeutic dosing - return in 2 months (3 months from prior pessary check)   Vaginal atrophy Assessment & Plan: - For symptomatic vaginal atrophy options include lubrication with a water-based lubricant, personal hygiene measures and barrier protection  against wetness, and estrogen replacement in the form of vaginal cream, vaginal tablets, or a time-released vaginal ring.   - increase low dose vaginal estrogen 1g 3x/week to reassess lower urinary tract symptoms   Feeling of incomplete bladder emptying Assessment & Plan: - prior bladder scan , repeat today - UDS 08/10/23 with SUI, noted elevated PVR . Pdet at Qmax was 23 cm of water.  Qmax was 13 mL/sec.  - possibly prior outlet obstruction, anterior vaginal wall noted at introitus with pessary in place - no anterior vaginal wall noted with 2 3/4in long stem gellhorn since 07/22/23 - repeat PVR 8mL with pessary in place   Lianne ONEIDA Gillis, MD

## 2023-12-21 NOTE — Assessment & Plan Note (Signed)
-   discussed risk of change in urinary or bowel symptoms, vaginal ulceration, discharge, bleeding, fistula formation. Explained that pt may require multiple sizes and types for fitting.  - pt reports discomfort at time of pessary management, encouraged pt to use topical lidocaine at the time of pessary management to reduce discomfort - increase vaginal estrogen 1g 3x/week for therapeutic dosing - return in 2 months (3 months from prior pessary check)

## 2023-12-21 NOTE — Assessment & Plan Note (Signed)
-   prior bladder scan , repeat today - UDS 08/10/23 with SUI, noted elevated PVR . Pdet at Qmax was 23 cm of water.  Qmax was 13 mL/sec.  - possibly prior outlet obstruction, anterior vaginal wall noted at introitus with pessary in place - no anterior vaginal wall noted with 2 3/4in long stem gellhorn since 07/22/23 - repeat PVR 8mL with pessary in place

## 2023-12-21 NOTE — Assessment & Plan Note (Signed)
-   For symptomatic vaginal atrophy options include lubrication with a water-based lubricant, personal hygiene measures and barrier protection against wetness, and estrogen replacement in the form of vaginal cream, vaginal tablets, or a time-released vaginal ring.   - increase low dose vaginal estrogen 1g 3x/week to reassess lower urinary tract symptoms

## 2023-12-21 NOTE — Patient Instructions (Addendum)
 Continue vaginal estrogen 3x/week.   We discussed the symptoms of overactive bladder (OAB), which include urinary urgency, urinary frequency, night-time urination, with or without urge incontinence.  We discussed management including behavioral therapy (decreasing bladder irritants by following a bladder diet, urge suppression strategies, timed voids, bladder retraining), physical therapy, medication; and for refractory cases posterior tibial nerve stimulation, sacral neuromodulation, and intravesical botulinum toxin injection.   For anticholinergic medications, we discussed the potential side effects of anticholinergics including dry eyes, dry mouth, constipation, rare risks of cognitive impairment and urinary retention. You were given prescription for Trospium.  It can take a month to start working so give it time, but if you have bothersome side effects call sooner and we can try a different medication.  Call us  if you have trouble filling the prescription or if it's not covered by your insurance.  Please visit the website below to sign up for an account. We will need an email address to send along with your prescription to verify your prescription once you have signed up.  [email or web address]  Trospium Chloride ER Capsule Extended Release  60mg   30 count $31.11  Or    Trospium Chloride (2 times a day dosing) Tablet  20mg   60 count  $14.03   Please call if you experience any change in urinary or vaginal symptoms.

## 2023-12-21 NOTE — Assessment & Plan Note (Signed)
-   04/02/23 POCT UA negative, bladder scan . Repeat with 2 3/4in long stem gellhorn pessary and 8mL on repeat today - urgency > stress prior to pessary refitting, now 1x/day with 1st void - SUI resolved after bulkamid 11/18/23 - We discussed the symptoms of overactive bladder (OAB), which include urinary urgency, urinary frequency, nocturia, with or without urge incontinence.  While we do not know the exact etiology of OAB, several treatment options exist. We discussed management including behavioral therapy (decreasing bladder irritants, urge suppression strategies, timed voids, bladder retraining), physical therapy, medication; for refractory cases posterior tibial nerve stimulation, sacral neuromodulation, and intravesical botulinum toxin injection.  For anticholinergic medications, we discussed the potential side effects of anticholinergics including dry eyes, dry mouth, constipation, cognitive impairment and urinary retention. For Beta-3 agonist medication, we discussed the potential side effect of elevated blood pressure which is more likely to occur in individuals with uncontrolled hypertension. - encouraged to continue fluid management, caffeine reduction - Rx to start Trospium 60mg  XL, pt to call if it is cost prohibitive - For treatment of stress urinary incontinence,  non-surgical options include expectant management, weight loss, physical therapy, as well as a pessary.  Surgical options include a midurethral sling, Burch urethropexy, and transurethral injection of a bulking agent. - continue low dose vaginal estrogen, increase to 3x/week - encouraged Kegel exercises

## 2024-01-10 ENCOUNTER — Ambulatory Visit: Admitting: Nurse Practitioner

## 2024-01-10 ENCOUNTER — Encounter: Payer: Self-pay | Admitting: Nurse Practitioner

## 2024-01-10 VITALS — BP 110/72 | HR 81 | Temp 98.0°F | Ht 65.0 in | Wt 171.0 lb

## 2024-01-10 DIAGNOSIS — R7303 Prediabetes: Secondary | ICD-10-CM

## 2024-01-10 DIAGNOSIS — E669 Obesity, unspecified: Secondary | ICD-10-CM

## 2024-01-10 DIAGNOSIS — R638 Other symptoms and signs concerning food and fluid intake: Secondary | ICD-10-CM

## 2024-01-10 DIAGNOSIS — E559 Vitamin D deficiency, unspecified: Secondary | ICD-10-CM | POA: Diagnosis not present

## 2024-01-10 DIAGNOSIS — E782 Mixed hyperlipidemia: Secondary | ICD-10-CM

## 2024-01-10 DIAGNOSIS — Z6828 Body mass index (BMI) 28.0-28.9, adult: Secondary | ICD-10-CM

## 2024-01-10 MED ORDER — BUPROPION HCL ER (SR) 150 MG PO TB12: 150.0000 mg | ORAL_TABLET | Freq: Every day | ORAL | 0 refills | Status: DC

## 2024-01-10 NOTE — Progress Notes (Signed)
 Office: [phone]  /  Fax: [phone]  WEIGHT SUMMARY AND BIOMETRICS  Weight Lost Since Last Visit: 3lb  Weight Gained Since Last Visit: 0lb   Vitals Temp: 98 F (36.7 C) BP: 110/72 Pulse Rate: 81 SpO2: 98 %   Anthropometric Measurements Height: 5' 5 (1.651 m) Weight: 171 lb (77.6 kg) BMI (Calculated): 28.46 Weight at Last Visit: 174lb Weight Lost Since Last Visit: 3lb Weight Gained Since Last Visit: 0lb Starting Weight: 200lb Total Weight Loss (lbs): 29 lb (13.2 kg)   Body Composition  Body Fat %: 40.4 % Fat Mass (lbs): 69.2 lbs Muscle Mass (lbs): 97 lbs Total Body Water (lbs): 68.2 lbs Visceral Fat Rating : 11   Other Clinical Data Fasting: Yes Labs: No Today's Visit #: 8 Starting Date: 07/29/23     HPI  Chief Complaint: OBESITY  Jane Howard is here to discuss her progress with her obesity treatment plan. She is on the the Category 2 Plan and states she is following her eating plan approximately 90 % of the time. She states she is exercising 60+ minutes 2 days per week.   Interval History:  Since last office visit she has lost 3 pounds.  She is surprised she lost weight.  She went on vacation, celebrated her birthday and celebrated Thanksgiving. She is doing well with the meal plan. She is struggling with cravings especially sweets in the evening.  Notes polyphagia. She is drinking water daily.   She is going to the [facility] 2 days per week-45 min class, treadmill, elliptical and resistance training.    Her highest weight was 208 lbs.    Current medication for weight loss:  None    Previous pharmacotherapy for medical weight loss:  Phentermine    Bariatric surgery:  Patient has not had bariatric surgery   Vit D deficiency  She is taking Vit D OTC-unsure of dose.  Denies side effects.  Denies nausea, vomiting or muscle weakness.    Lab Results  Component Value Date   VD25OH 85.6 12/02/2023   VD25OH 46.0 08/11/2022   VD25OH 15.4 (L) 11/27/2016     Hyperlipidemia Medication(s): Lipitor 20mg . Denies side effects.    Lab Results  Component Value Date   CHOL 184 12/02/2023   HDL 55 12/02/2023   LDLCALC 108 (H) 12/02/2023   TRIG 118 12/02/2023   CHOLHDL 3.2 08/17/2023   Lab Results  Component Value Date   ALT 18 12/02/2023   AST 20 12/02/2023   ALKPHOS 92 12/02/2023   BILITOT 0.4 12/02/2023   The 10-year ASCVD risk score (Arnett DK, et al., 2019) is: 7.4%   Values used to calculate the score:     Age: 68 years     Clincally relevant sex: Female     Is Non-Hispanic African American: No     Diabetic: No     Tobacco smoker: No     Systolic Blood Pressure: 110 mmHg     Is BP treated: Yes     HDL Cholesterol: 55 mg/dL     Total Cholesterol: 184 mg/dL   Prediabetes Last J8r was 5.8 and fasting insulin  was 14.0 Medication(s): None Polyphagia:Yes Lab Results  Component Value Date   HGBA1C 5.8 (H) 12/02/2023   HGBA1C 5.7 (H) 07/29/2023   HGBA1C 5.6% 02/01/2013   Lab Results  Component Value Date   INSULIN  14.0 12/02/2023   INSULIN  17.0 07/29/2023   PHYSICAL EXAM:  Blood pressure 110/72, pulse 81, temperature 98 F (36.7 C), height 5' 5 (1.651  m), weight 171 lb (77.6 kg), SpO2 98%. Body mass index is 28.46 kg/m.  General: She is overweight, cooperative, alert, well developed, and in no acute distress. PSYCH: Has normal mood, affect and thought process.   Extremities: No edema.  Neurologic: No gross sensory or motor deficits. No tremors or fasciculations noted.    DIAGNOSTIC DATA REVIEWED:  BMET    Component Value Date/Time   NA 139 12/02/2023 0921   K 4.8 12/02/2023 0921   CL 102 12/02/2023 0921   CO2 23 12/02/2023 0921   GLUCOSE 103 (H) 12/02/2023 0921   GLUCOSE 114 (H) 11/16/2017 1608   BUN 30 (H) 12/02/2023 0921   CREATININE 0.88 12/02/2023 0921   CALCIUM  10.3 12/02/2023 0921   GFRNONAA 59 (L) 08/04/2019 1428   GFRAA 68 08/04/2019 1428   Lab Results  Component Value Date   HGBA1C 5.8 (H)  12/02/2023   HGBA1C 5.6% 02/01/2013   Lab Results  Component Value Date   INSULIN  14.0 12/02/2023   INSULIN  17.0 07/29/2023   Lab Results  Component Value Date   TSH 0.933 07/29/2023   CBC    Component Value Date/Time   WBC 6.7 08/17/2023 1021   WBC 20.1 (H) 11/16/2017 1608   RBC 4.71 08/17/2023 1021   RBC 4.68 11/16/2017 1608   HGB 13.9 08/17/2023 1021   HCT 43.3 08/17/2023 1021   PLT 256 08/17/2023 1021   MCV 92 08/17/2023 1021   MCH 29.5 08/17/2023 1021   MCH 29.3 11/16/2017 1608   MCHC 32.1 08/17/2023 1021   MCHC 32.9 11/16/2017 1608   RDW 12.7 08/17/2023 1021   Iron Studies    Component Value Date/Time   IRON 51 11/27/2016 1709   TIBC 304 11/27/2016 1709   FERRITIN 299 (H) 11/27/2016 1709   IRONPCTSAT 17 11/27/2016 1709   Lipid Panel     Component Value Date/Time   CHOL 184 12/02/2023 0921   TRIG 118 12/02/2023 0921   TRIG 354 (H) 11/17/2013 1657   HDL 55 12/02/2023 0921   HDL 41 11/17/2013 1657   CHOLHDL 3.2 08/17/2023 1021   LDLCALC 108 (H) 12/02/2023 0921   LDLCALC 109 (H) 11/17/2013 1657   Hepatic Function Panel     Component Value Date/Time   PROT 7.2 12/02/2023 0921   ALBUMIN 4.3 12/02/2023 0921   AST 20 12/02/2023 0921   ALT 18 12/02/2023 0921   ALKPHOS 92 12/02/2023 0921   BILITOT 0.4 12/02/2023 0921      Component Value Date/Time   TSH 0.933 07/29/2023 1028   Nutritional Lab Results  Component Value Date   VD25OH 85.6 12/02/2023   VD25OH 46.0 08/11/2022   VD25OH 15.4 (L) 11/27/2016     ASSESSMENT AND PLAN  TREATMENT PLAN FOR OBESITY:  Recommended Dietary Goals  Jane Howard is currently in the action stage of change. As such, her goal is to continue weight management plan. She has agreed to keeping a food journal and adhering to recommended goals of 1300 calories and 90+ grams of protein.  Behavioral Intervention  We discussed the following Behavioral Modification Strategies today: increasing lean protein intake to  established goals, decreasing simple carbohydrates , increasing vegetables, increasing fiber rich foods, increasing water intake , work on tracking and journaling calories using tracking application, celebration eating strategies, continue to work on maintaining a reduced calorie state, getting the recommended amount of protein, incorporating whole foods, making healthy choices, staying well hydrated and practicing mindfulness when eating., and increase protein intake, fibrous foods (25  grams per day for women, 30 grams for men) and water to improve satiety and decrease hunger signals. .  Additional resources provided today: NA  Recommended Physical Activity Goals  Analia has been advised to work up to 150 minutes of moderate intensity aerobic activity a week and strengthening exercises 2-3 times per week for cardiovascular health, weight loss maintenance and preservation of muscle mass.   She has agreed to Think about enjoyable ways to increase daily physical activity and overcoming barriers to exercise, Increase physical activity in their day and reduce sedentary time (increase NEAT)., Continue to gradually increase the amount and intensity of exercise routine, Increase volume of physical activity to a goal of 240 minutes a week, and Combine aerobic and strengthening exercises for efficiency and improved cardiometabolic health.   Pharmacotherapy We discussed various medication options to help [facility] with her weight loss efforts and we both agreed to start Wellburin SR 150mg  off label for cravings.  Side effects discussed.  ASSOCIATED CONDITIONS ADDRESSED TODAY  Action/Plan  Vitamin D  deficiency Patient is unsure what Vit D dose she is taking.  I recommended cutting the dose in half.  To let me know dose at her next visit  Mixed hyperlipidemia Managed by PCP.  Continue Lipitor as directed. I would recommend to discuss with PCP to see if she wants to adjust her Lipitor dose.     Prediabetes Last A1c increased from 5.7 to 5.8.   Consider Metformin in the future Would like to see how she dose with the addition of Wellbutrin.  See if will help with cravings.  If not, will discuss at next visit  Abnormal craving -     Start buPROPion HCl ER (SR); Take 1 tablet (150 mg total) by mouth daily.  Dispense: 30 tablet; Refill: 0. Side effects discussed.       Labs reviewed in chart with patient from 12/02/23   Return in about 4 weeks (around 02/07/2024).Jane Howard She was informed of the importance of frequent follow up visits to maximize her success with intensive lifestyle modifications for her multiple health conditions.   ATTESTASTION STATEMENTS:  Reviewed by clinician on day of visit: allergies, medications, problem list, medical history, surgical history, family history, social history, and previous encounter notes.    Jane Howard. Jane Thieme FNP-C

## 2024-02-01 ENCOUNTER — Other Ambulatory Visit: Payer: Self-pay | Admitting: Nurse Practitioner

## 2024-02-01 DIAGNOSIS — R638 Other symptoms and signs concerning food and fluid intake: Secondary | ICD-10-CM

## 2024-02-03 ENCOUNTER — Other Ambulatory Visit: Payer: Self-pay | Admitting: Nurse Practitioner

## 2024-02-07 ENCOUNTER — Ambulatory Visit: Admitting: Nurse Practitioner

## 2024-02-07 ENCOUNTER — Encounter: Payer: Self-pay | Admitting: Nurse Practitioner

## 2024-02-07 VITALS — BP 122/78 | HR 82 | Temp 98.6°F | Ht 65.0 in | Wt 167.0 lb

## 2024-02-07 DIAGNOSIS — R638 Other symptoms and signs concerning food and fluid intake: Secondary | ICD-10-CM | POA: Diagnosis not present

## 2024-02-07 DIAGNOSIS — Z6827 Body mass index (BMI) 27.0-27.9, adult: Secondary | ICD-10-CM | POA: Diagnosis not present

## 2024-02-07 DIAGNOSIS — E669 Obesity, unspecified: Secondary | ICD-10-CM | POA: Diagnosis not present

## 2024-02-07 NOTE — Progress Notes (Signed)
 "  Office: [phone]  /  Fax: [phone]  WEIGHT SUMMARY AND BIOMETRICS  Weight Lost Since Last Visit: 4lb  Weight Gained Since Last Visit: 0lb   Vitals Temp: 98.6 F (37 C) BP: 122/78 Pulse Rate: 82 SpO2: 98 %   Anthropometric Measurements Height: 5' 5 (1.651 m) Weight: 167 lb (75.8 kg) BMI (Calculated): 27.79 Weight at Last Visit: 171lb Weight Lost Since Last Visit: 4lb Weight Gained Since Last Visit: 0lb Starting Weight: 200lb Total Weight Loss (lbs): 3 lb (1.361 kg)   Body Composition  Body Fat %: 39.9 % Fat Mass (lbs): 66.8 lbs Muscle Mass (lbs): 95.2 lbs Total Body Water (lbs): 67.6 lbs Visceral Fat Rating : 10   Other Clinical Data Fasting: no Labs: No Today's Visit #: 9 Starting Date: 07/29/23     HPI  Chief Complaint: OBESITY  Jane Howard is here to discuss her progress with her obesity treatment plan. She is on the the Category 2 Plan and states she is following her eating plan approximately 75-80 % of the time. She states she is exercising 45-60 minutes 2 days per week.   Interval History:  Since last office visit she has lost 4 pounds.  She restarted taking Lexapro  10mg  last week.  Feels it is overall helping with her cravings. She is surprised she lost weight.  She notes she has been eating more sweets over the holidays. She is meeting her protein goals.  She is drinking water, G2, coffee and vitamin water.  She is exercising 2 days per week-cardio treadmill 10-15 mins and classes 45 mins.  Her highest weight was 208 lbs.     Current medication for weight loss:  None    Previous pharmacotherapy for medical weight loss:  Phentermine    Bariatric surgery:  Patient has not had bariatric surgery   Abnormal cravings Started Wellbutrin  SR 150mg  after last visit.  She took it for 2-3 weeks and stopped due to side effects of dry mouth.    PHYSICAL EXAM:  Blood pressure 122/78, pulse 82, temperature 98.6 F (37 C), height 5' 5 (1.651 m),  weight 167 lb (75.8 kg), SpO2 98%. Body mass index is 27.79 kg/m.  General: She is overweight, cooperative, alert, well developed, and in no acute distress. PSYCH: Has normal mood, affect and thought process.   Extremities: No edema.  Neurologic: No gross sensory or motor deficits. No tremors or fasciculations noted.    DIAGNOSTIC DATA REVIEWED:  BMET    Component Value Date/Time   NA 139 12/02/2023 0921   K 4.8 12/02/2023 0921   CL 102 12/02/2023 0921   CO2 23 12/02/2023 0921   GLUCOSE 103 (H) 12/02/2023 0921   GLUCOSE 114 (H) 11/16/2017 1608   BUN 30 (H) 12/02/2023 0921   CREATININE 0.88 12/02/2023 0921   CALCIUM  10.3 12/02/2023 0921   GFRNONAA 59 (L) 08/04/2019 1428   GFRAA 68 08/04/2019 1428   Lab Results  Component Value Date   HGBA1C 5.8 (H) 12/02/2023   HGBA1C 5.6% 02/01/2013   Lab Results  Component Value Date   INSULIN  14.0 12/02/2023   INSULIN  17.0 07/29/2023   Lab Results  Component Value Date   TSH 0.933 07/29/2023   CBC    Component Value Date/Time   WBC 6.7 08/17/2023 1021   WBC 20.1 (H) 11/16/2017 1608   RBC 4.71 08/17/2023 1021   RBC 4.68 11/16/2017 1608   HGB 13.9 08/17/2023 1021   HCT 43.3 08/17/2023 1021   PLT 256 08/17/2023 1021  MCV 92 08/17/2023 1021   MCH 29.5 08/17/2023 1021   MCH 29.3 11/16/2017 1608   MCHC 32.1 08/17/2023 1021   MCHC 32.9 11/16/2017 1608   RDW 12.7 08/17/2023 1021   Iron Studies    Component Value Date/Time   IRON 51 11/27/2016 1709   TIBC 304 11/27/2016 1709   FERRITIN 299 (H) 11/27/2016 1709   IRONPCTSAT 17 11/27/2016 1709   Lipid Panel     Component Value Date/Time   CHOL 184 12/02/2023 0921   TRIG 118 12/02/2023 0921   TRIG 354 (H) 11/17/2013 1657   HDL 55 12/02/2023 0921   HDL 41 11/17/2013 1657   CHOLHDL 3.2 08/17/2023 1021   LDLCALC 108 (H) 12/02/2023 0921   LDLCALC 109 (H) 11/17/2013 1657   Hepatic Function Panel     Component Value Date/Time   PROT 7.2 12/02/2023 0921   ALBUMIN 4.3  12/02/2023 0921   AST 20 12/02/2023 0921   ALT 18 12/02/2023 0921   ALKPHOS 92 12/02/2023 0921   BILITOT 0.4 12/02/2023 0921      Component Value Date/Time   TSH 0.933 07/29/2023 1028   Nutritional Lab Results  Component Value Date   VD25OH 85.6 12/02/2023   VD25OH 46.0 08/11/2022   VD25OH 15.4 (L) 11/27/2016     ASSESSMENT AND PLAN  TREATMENT PLAN FOR OBESITY:  Recommended Dietary Goals  Jane Howard is currently in the action stage of change. As such, her goal is to continue weight management plan. She has agreed to keeping a food journal and adhering to recommended goals of 1400-1500 calories and 80+ grams of protein.  Behavioral Intervention  We discussed the following Behavioral Modification Strategies today: increasing lean protein intake to established goals, decreasing simple carbohydrates , increasing vegetables, increasing fiber rich foods, increasing water intake , work on meal planning and preparation, work on tracking and journaling calories using tracking application, reading food labels , keeping healthy foods at home, planning for success, better snacking choices, celebration eating strategies, continue to work on maintaining a reduced calorie state, getting the recommended amount of protein, incorporating whole foods, making healthy choices, staying well hydrated and practicing mindfulness when eating., and increase protein intake, fibrous foods (25 grams per day for women, 30 grams for men) and water to improve satiety and decrease hunger signals. .  Additional resources provided today: NA  Recommended Physical Activity Goals  Jane Howard has been advised to work up to 150 minutes of moderate intensity aerobic activity a week and strengthening exercises 2-3 times per week for cardiovascular health, weight loss maintenance and preservation of muscle mass.   She has agreed to Think about enjoyable ways to increase daily physical activity and overcoming barriers to  exercise, Increase physical activity in their day and reduce sedentary time (increase NEAT)., Start strengthening exercises with a goal of 2-3 sessions a week , Continue to gradually increase the amount and intensity of exercise routine, Increase volume of physical activity to a goal of 240 minutes a week, and Combine aerobic and strengthening exercises for efficiency and improved cardiometabolic health.   ASSOCIATED CONDITIONS ADDRESSED TODAY  Action/Plan  Abnormal craving She is overall doing better concerning her cravings.  Will continue to monitor Doing better since starting Lexapro .  To let me know if worsens or persist   Generalized obesity  BMI 27.0-27.9,adult Today we discussed the importance of increasing protein goals and adding in resistance training to maintain/preserve muscle mass and help to prevent bone loss.  She is scheduled for CPE 02/14/24  Labs reviewed in chart from 12/02/23   Return in about 4 weeks (around 03/06/2024).SABRA She was informed of the importance of frequent follow up visits to maximize her success with intensive lifestyle modifications for her multiple health conditions.   ATTESTASTION STATEMENTS:  Reviewed by clinician on day of visit: allergies, medications, problem list, medical history, surgical history, family history, social history, and previous encounter notes.   I personally spent a total of 30 minutes in the care of the patient today including preparing to see the patient, getting/reviewing separately obtained history, counseling and educating, documenting clinical information in the EHR, and communicating results.    Corean SAUNDERS. Nakeyia Menden FNP-C "

## 2024-02-14 ENCOUNTER — Encounter: Payer: Self-pay | Admitting: Nurse Practitioner

## 2024-02-14 ENCOUNTER — Ambulatory Visit (INDEPENDENT_AMBULATORY_CARE_PROVIDER_SITE_OTHER): Payer: Self-pay | Admitting: Nurse Practitioner

## 2024-02-14 ENCOUNTER — Ambulatory Visit: Admitting: Nurse Practitioner

## 2024-02-14 VITALS — BP 104/66 | HR 76 | Temp 97.2°F | Ht 65.0 in | Wt 170.0 lb

## 2024-02-14 DIAGNOSIS — K5901 Slow transit constipation: Secondary | ICD-10-CM

## 2024-02-14 DIAGNOSIS — E669 Obesity, unspecified: Secondary | ICD-10-CM | POA: Diagnosis not present

## 2024-02-14 DIAGNOSIS — I10 Essential (primary) hypertension: Secondary | ICD-10-CM

## 2024-02-14 DIAGNOSIS — F411 Generalized anxiety disorder: Secondary | ICD-10-CM | POA: Diagnosis not present

## 2024-02-14 DIAGNOSIS — E782 Mixed hyperlipidemia: Secondary | ICD-10-CM

## 2024-02-14 DIAGNOSIS — K219 Gastro-esophageal reflux disease without esophagitis: Secondary | ICD-10-CM | POA: Diagnosis not present

## 2024-02-14 DIAGNOSIS — M1A9XX0 Chronic gout, unspecified, without tophus (tophi): Secondary | ICD-10-CM | POA: Diagnosis not present

## 2024-02-14 DIAGNOSIS — N3946 Mixed incontinence: Secondary | ICD-10-CM

## 2024-02-14 DIAGNOSIS — F5101 Primary insomnia: Secondary | ICD-10-CM

## 2024-02-14 DIAGNOSIS — E559 Vitamin D deficiency, unspecified: Secondary | ICD-10-CM

## 2024-02-14 LAB — CMP14+EGFR
ALT: 28 IU/L (ref 0–32)
AST: 22 IU/L (ref 0–40)
Albumin: 4.1 g/dL (ref 3.9–4.9)
Alkaline Phosphatase: 83 IU/L (ref 49–135)
BUN/Creatinine Ratio: 30 — ABNORMAL HIGH (ref 12–28)
BUN: 24 mg/dL (ref 8–27)
Bilirubin Total: 0.3 mg/dL (ref 0.0–1.2)
CO2: 24 mmol/L (ref 20–29)
Calcium: 9.7 mg/dL (ref 8.7–10.3)
Chloride: 103 mmol/L (ref 96–106)
Creatinine, Ser: 0.8 mg/dL (ref 0.57–1.00)
Globulin, Total: 2.6 g/dL (ref 1.5–4.5)
Glucose: 106 mg/dL — ABNORMAL HIGH (ref 70–99)
Potassium: 5 mmol/L (ref 3.5–5.2)
Sodium: 140 mmol/L (ref 134–144)
Total Protein: 6.7 g/dL (ref 6.0–8.5)
eGFR: 80 mL/min/1.73

## 2024-02-14 LAB — LIPID PANEL
Chol/HDL Ratio: 3.1 ratio (ref 0.0–4.4)
Cholesterol, Total: 170 mg/dL (ref 100–199)
HDL: 55 mg/dL
LDL Chol Calc (NIH): 94 mg/dL (ref 0–99)
Triglycerides: 119 mg/dL (ref 0–149)
VLDL Cholesterol Cal: 21 mg/dL (ref 5–40)

## 2024-02-14 LAB — CBC WITH DIFFERENTIAL/PLATELET
Basophils Absolute: 0.1 x10E3/uL (ref 0.0–0.2)
Basos: 1 %
EOS (ABSOLUTE): 0.1 x10E3/uL (ref 0.0–0.4)
Eos: 1 %
Hematocrit: 42.1 % (ref 34.0–46.6)
Hemoglobin: 13.7 g/dL (ref 11.1–15.9)
Immature Grans (Abs): 0 x10E3/uL (ref 0.0–0.1)
Immature Granulocytes: 0 %
Lymphocytes Absolute: 1.9 x10E3/uL (ref 0.7–3.1)
Lymphs: 30 %
MCH: 29.5 pg (ref 26.6–33.0)
MCHC: 32.5 g/dL (ref 31.5–35.7)
MCV: 91 fL (ref 79–97)
Monocytes Absolute: 0.3 x10E3/uL (ref 0.1–0.9)
Monocytes: 5 %
Neutrophils Absolute: 3.8 x10E3/uL (ref 1.4–7.0)
Neutrophils: 63 %
Platelets: 258 x10E3/uL (ref 150–450)
RBC: 4.65 x10E6/uL (ref 3.77–5.28)
RDW: 12 % (ref 11.7–15.4)
WBC: 6.1 x10E3/uL (ref 3.4–10.8)

## 2024-02-14 MED ORDER — ESCITALOPRAM OXALATE 10 MG PO TABS: 10.0000 mg | ORAL_TABLET | Freq: Every day | ORAL | 1 refills | Status: AC

## 2024-02-14 MED ORDER — CLONAZEPAM 0.5 MG PO TABS: 0.5000 mg | ORAL_TABLET | Freq: Two times a day (BID) | ORAL | 5 refills | Status: AC | PRN

## 2024-02-14 MED ORDER — ATORVASTATIN CALCIUM 20 MG PO TABS: 20.0000 mg | ORAL_TABLET | Freq: Every day | ORAL | 1 refills | Status: AC

## 2024-02-14 MED ORDER — LISINOPRIL 40 MG PO TABS: 40.0000 mg | ORAL_TABLET | Freq: Every day | ORAL | 1 refills | Status: AC

## 2024-02-14 MED ORDER — HYDROCHLOROTHIAZIDE 50 MG PO TABS: 50.0000 mg | ORAL_TABLET | Freq: Every day | ORAL | 1 refills | Status: AC

## 2024-02-14 NOTE — Patient Instructions (Signed)

## 2024-02-14 NOTE — Progress Notes (Signed)
 "  Subjective:    Patient ID: Jane Howard, female    DOB: 1955-12-04, 69 y.o.   MRN: [id number]   Chief Complaint: medical management of chronic issues     HPI:  Jane Howard is a 69 y.o. who identifies as a female who was assigned female at birth.   Social history: Lives with: her grandson still lives with her Work history: retired   Water Engineer in today for follow up of the following chronic medical issues:  1. Essential hypertension, benign No c/o chest pain, sob or headache. Doe snot check blood pressure at home BP Readings from Last 3 Encounters:  02/07/24 122/78  01/10/24 110/72  12/21/23 (!) 125/57     2. Mixed hyperlipidemia Does not watch diet very closely. No exercise. Lab Results  Component Value Date   CHOL 184 12/02/2023   HDL 55 12/02/2023   LDLCALC 108 (H) 12/02/2023   TRIG 118 12/02/2023   CHOLHDL 3.2 08/17/2023   The 10-year ASCVD risk score (Arnett DK, et al., 2019) is: 9%   3. Gastroesophageal reflux disease without esophagitis No recent issues. On no prescription meds for this  4. Slow transit constipation Only has occasionally. Probiotic really helps. Will take a stool softner occasionally  5. GAD (generalized anxiety disorder) Is on combintion of klonopin  and lexapro . Says she is doing well.    08/17/2023   10:02 AM 08/17/2023    9:44 AM 06/15/2023    9:00 AM 08/11/2022   10:22 AM  GAD 7 : Generalized Anxiety Score  Nervous, Anxious, on Edge 0 0 0 0  Control/stop worrying 0 0 0 0  Worry too much - different things 0 0 0 0  Trouble relaxing 0 0 0 0  Restless 0 0 0 0  Easily annoyed or irritable 0 0 0 0  Afraid - awful might happen 0 0 0 0  Total GAD 7 Score 0 0 0 0  Anxiety Difficulty  Not difficult at all Not difficult at all Not difficult at all       02/14/2024    9:49 AM 08/17/2023   10:02 AM 08/17/2023    9:44 AM  Depression screen PHQ 2/9  Decreased Interest 0 0 0  Down, Depressed, Hopeless 0 0 0  PHQ - 2 Score 0 0 0  Altered  sleeping  0   Tired, decreased energy  0   Change in appetite  0   Feeling bad or failure about yourself   0   Trouble concentrating  0   Moving slowly or fidgety/restless  0   Suicidal thoughts  0   PHQ-9 Score  0    Difficult doing work/chores  Not difficult at all      Data saved with a previous flowsheet row definition   Contract signed 02/14/24- urine drug screen today  .  6. Chronic gout without tophus, unspecified cause, unspecified site No recent flare ups  7. Primary insomnia Doing well. Sleeps about 6 hours a night  8. Vitamin D  deficiency Daily vitamin d  supplement Last vitamin D  Lab Results  Component Value Date   VD25OH 85.6 12/02/2023     9. Urinary incontinence Sees Dr. Guadlupe yearly. Was last seen on 12/21/23. She gets transurethral Bulkamid injection. She is doing well, incontinence some better since last injection.  10. Obesity (BMI 30-39.9) No recent weight changes  Wt Readings from Last 3 Encounters:  02/14/24 170 lb (77.1 kg)  02/07/24 167 lb (75.8 kg)  01/10/24  171 lb (77.6 kg)   BMI Readings from Last 3 Encounters:  02/14/24 28.29 kg/m  02/07/24 27.79 kg/m  01/10/24 28.46 kg/m    New complaints: None today  Allergies  Allergen Reactions   Amoxicillin  Other (See Comments)    Causes yeast infection   Macrodantin [Nitrofurantoin Macrocrystal]    Vicodin [Hydrocodone -Acetaminophen ]    Outpatient Encounter Medications as of 02/14/2024  Medication Sig   allopurinol  (ZYLOPRIM ) 100 MG tablet Take 1 tablet (100 mg total) by mouth daily.   aspirin EC 81 MG tablet Take 81 mg by mouth daily.   atorvastatin  (LIPITOR) 20 MG tablet Take 1 tablet (20 mg total) by mouth daily.   Calcium  Carb-Cholecalciferol (CALCIUM  500+D) 500-5 MG-MCG TABS Take 1 tablet by mouth daily.   Cholecalciferol (VITAMIN D3) 125 MCG (5000 UT) CAPS Take 1 capsule by mouth daily.   ciprofloxacin  (CIPRO ) 500 MG tablet Take 1 tablet the morning of your procedure and one the  evening after your procedure.   clonazePAM  (KLONOPIN ) 0.5 MG tablet Take 1 tablet (0.5 mg total) by mouth 2 (two) times daily as needed for anxiety.   escitalopram  (LEXAPRO ) 10 MG tablet Take 1 tablet (10 mg total) by mouth daily.   estradiol  (ESTRACE ) 0.1 MG/GM vaginal cream Use 1/2 g vaginally two or three times per week as needed to maintain symptom relief.   fluticasone  (FLONASE ) 50 MCG/ACT nasal spray PLACE 1 SPRAY INTO BOTH NOSTRILS 2 (TWO) TIMES DAILY AS NEEDED FOR ALLERGIES OR RHINITIS.   hydrochlorothiazide  (HYDRODIURIL ) 50 MG tablet Take 1 tablet (50 mg total) by mouth daily.   lisinopril  (ZESTRIL ) 40 MG tablet Take 1 tablet (40 mg total) by mouth daily.   Multiple Vitamin (MULTI-DAY PO) Take 1 tablet by mouth daily. L-Lysine   Multiple Vitamins-Minerals (EYE VITAMINS PO) Take by mouth.   Probiotic Product (PROBIOTIC PO)    psyllium (METAMUCIL) 58.6 % packet Take 1 packet by mouth daily.   Trospium  Chloride 60 MG CP24 Take 1 capsule (60 mg total) by mouth at bedtime.   No facility-administered encounter medications on file as of 02/14/2024.    Past Surgical History:  Procedure Laterality Date   ABDOMINAL HYSTERECTOMY     FOOT SURGERY Bilateral    NASAL SEPTUM SURGERY     TONSILLECTOMY      Family History  Problem Relation Age of Onset   Hyperlipidemia Mother    Hypertension Mother    Heart disease Mother    Kidney disease Mother    Hyperlipidemia Father    Hypertension Father    Prostate cancer Father        prostate   Heart disease Father    Kidney disease Father    Bladder Cancer Neg Hx    Uterine cancer Neg Hx       Controlled substance contract: 02/11/22     Review of Systems  Constitutional:  Negative for diaphoresis.  Eyes:  Negative for pain.  Respiratory:  Negative for shortness of breath.   Cardiovascular:  Negative for chest pain, palpitations and leg swelling.  Gastrointestinal:  Negative for abdominal pain.  Endocrine: Negative for polydipsia.   Skin:  Negative for rash.  Neurological:  Negative for dizziness, weakness and headaches.  Hematological:  Does not bruise/bleed easily.  All other systems reviewed and are negative.      Objective:   Physical Exam Vitals and nursing note reviewed.  Constitutional:      General: She is not in acute distress.    Appearance: Normal  appearance. She is well-developed.  HENT:     Head: Normocephalic.     Right Ear: Tympanic membrane normal.     Left Ear: Tympanic membrane normal.     Nose: Nose normal.     Mouth/Throat:     Mouth: Mucous membranes are moist.  Eyes:     Pupils: Pupils are equal, round, and reactive to light.  Neck:     Vascular: No carotid bruit or JVD.  Cardiovascular:     Rate and Rhythm: Normal rate and regular rhythm.     Heart sounds: Normal heart sounds.  Pulmonary:     Effort: Pulmonary effort is normal. No respiratory distress.     Breath sounds: Normal breath sounds. No wheezing or rales.  Chest:     Chest wall: No tenderness.  Abdominal:     General: Bowel sounds are normal. There is no distension or abdominal bruit.     Palpations: Abdomen is soft. There is no hepatomegaly, splenomegaly, mass or pulsatile mass.     Tenderness: There is no abdominal tenderness.  Musculoskeletal:        General: Normal range of motion.     Cervical back: Normal range of motion and neck supple.  Lymphadenopathy:     Cervical: No cervical adenopathy.  Skin:    General: Skin is warm and dry.  Neurological:     Mental Status: She is alert and oriented to person, place, and time.     Deep Tendon Reflexes: Reflexes are normal and symmetric.  Psychiatric:        Behavior: Behavior normal.        Thought Content: Thought content normal.        Judgment: Judgment normal.    BP 104/66   Pulse 76   Temp (!) 97.2 F (36.2 C) (Temporal)   Ht 5' 5 (1.651 m)   Wt 170 lb (77.1 kg)   SpO2 98%   BMI 28.29 kg/m        Assessment & Plan:   Jane Howard comes in  today with chief complaint of medical management of chronic issues    Diagnosis and orders addressed:  1. Essential hypertension, benign Low sodium diet - CBC with Differential/Platelet - CMP14+EGFR - hydrochlorothiazide  (HYDRODIURIL ) 50 MG tablet; Take 1 tablet (50 mg total) by mouth daily.  Dispense: 90 tablet; Refill: 1 - lisinopril  (ZESTRIL ) 40 MG tablet; Take 1 tablet (40 mg total) by mouth daily.  Dispense: 90 tablet; Refill: 1  2. Mixed hyperlipidemia Low fat diet - Lipid panel - atorvastatin  (LIPITOR) 20 MG tablet; Take 1 tablet (20 mg total) by mouth daily.  Dispense: 90 tablet; Refill: 1  3. Gastroesophageal reflux disease without esophagitis Avoid spicy foods Do not eat 2 hours prior to bedtime  4. Slow transit constipation Increase fiber  5. GAD (generalized anxiety disorder) Stress management - clonazePAM  (KLONOPIN ) 0.5 MG tablet; Take 1 tablet (0.5 mg total) by mouth 2 (two) times daily as needed for anxiety.  Dispense: 60 tablet; Refill: 5 - escitalopram  (LEXAPRO ) 10 MG tablet; Take 1 tablet (10 mg total) by mouth daily.  Dispense: 90 tablet; Refill: 1  6. Chronic gout without tophus, unspecified cause, unspecified site  - allopurinol  (ZYLOPRIM ) 100 MG tablet; Take 1 tablet (100 mg total) by mouth daily.  Dispense: 90 tablet; Refill: 1  7. Primary insomnia Bedtime routine  8. Vitamin D  deficiency Continue vitamin d  supplement - VITAMIN D  25 Hydroxy (Vit-D Deficiency, Fractures)  9. Obesity (BMI 30-39.9)  Discussed diet and exercise for person with BMI >25 Will recheck weight in 3-6 months    Labs pending Health Maintenance reviewed Diet and exercise encouraged  Follow up plan: 6 months   Mary-Margaret Gladis, FNP  "

## 2024-02-15 ENCOUNTER — Ambulatory Visit: Payer: Self-pay | Admitting: Nurse Practitioner

## 2024-02-16 LAB — TOXASSURE SELECT 13 (MW), URINE

## 2024-02-17 ENCOUNTER — Ambulatory Visit: Admitting: Obstetrics and Gynecology

## 2024-02-20 ENCOUNTER — Telehealth: Payer: Self-pay

## 2024-02-20 NOTE — Telephone Encounter (Signed)
 Left patient a message explaining the office will be reaching out this week to get her appointment rescheduled, and apologized for the inconvenience.

## 2024-02-21 ENCOUNTER — Ambulatory Visit: Admitting: Obstetrics and Gynecology

## 2024-03-08 NOTE — Progress Notes (Unsigned)
 [facility] Return Visit  SUBJECTIVE  History of Present Illness: Jane Howard is a 69 y.o. female seen in follow-up for Stage III pelvic organ prolapse, mixed urinary incontinence, constipation, sensation of incomplete emptying, vaginal atrophy, history of anterior colporrhaphy, obesity, and pessary use. Plan at last visit was trial of Trospium  60mg , fluid management, caffeine reduction, low dose vaginal estrogen, fiber supplementation, squatting position for defecation, continue short stem 2 1/2 gellhorn pessary use.   S/p Bulkamid injection 11/18/23 Urinary frequency reduced after recent UTI treatment SUI resolved since bulkamid Using 2 3/4in long stem gellhorn since 07/22/23, denies bulge symptoms. Last pessary check 11/18/23 at the time of urethral bulking. Denies discharge or bleeding Drinking more water to lose weight, net lost 25lbs and exercises regularly UUI 1x/day if she waits too long to void after urge or with first void in the morning Tries to avoid fluid intake around 8pm, sleeps around 10pm Reports snoring, denies sleep apnea Using vaginal estrogen 2x/week ($70/tube with insurance, now with [facility]), desires to increase due to improvement of UTI symptoms when she uses vaginal estrogen Uses metamucil gummies 2-3x/week.   Urodynamic Impression 08/10/23:  1. Sensation was normal; capacity was normal 2. Stress Incontinence was demonstrated at normal pressures; 3. Detrusor Overactivity was not demonstrated. 4. Emptying was dysfunctional with a elevated PVR and intermittent pattern, a sustained detrusor contraction present,  abdominal straining not present, normal urethral sphincter activity on EMG. Pdet at Qmax was 23 cm of water.  Qmax was 13 mL/sec.    Past Medical History: Patient  has a past medical history of Anxiety, Back pain, Chest pain, Constipation, Depression, Edema, GERD (gastroesophageal reflux disease), High blood pressure, High cholesterol,  Joint pain, Nephrolithiasis, Palpitations, and Vitamin D  deficiency.   Past Surgical History: She  has a past surgical history that includes Tonsillectomy; Foot surgery (Bilateral); Abdominal hysterectomy; and Nasal septum surgery.   Medications: She has a current medication list which includes the following prescription(s): allopurinol , aspirin ec, atorvastatin , calcium  carb-cholecalciferol, vitamin d3, clonazepam , escitalopram , estradiol , fluticasone , hydrochlorothiazide , lisinopril , multiple vitamin, multiple vitamins-minerals, probiotic product, psyllium, and trospium  chloride.   Allergies: Patient is allergic to amoxicillin , macrodantin [nitrofurantoin macrocrystal], and vicodin [hydrocodone -acetaminophen ].   Social History: Patient  reports that she has never smoked. She has never used smokeless tobacco. She reports current alcohol use. She reports that she does not use drugs.     OBJECTIVE     Physical Exam: There were no vitals filed for this visit.  Gen: No apparent distress, A&O x 3.  Detailed Urogynecologic Evaluation:  Deferred. Prior exam showed:      ASSESSMENT AND PLAN    Ms. Hartsfield is a 69 y.o. with:  No diagnosis found.    There are no diagnoses linked to this encounter. Lianne ONEIDA Gillis, MD

## 2024-03-09 ENCOUNTER — Encounter: Payer: Self-pay | Admitting: Obstetrics

## 2024-03-09 ENCOUNTER — Ambulatory Visit: Admitting: Obstetrics

## 2024-03-09 VITALS — BP 122/84 | HR 71

## 2024-03-09 DIAGNOSIS — K5901 Slow transit constipation: Secondary | ICD-10-CM

## 2024-03-09 DIAGNOSIS — N8110 Cystocele, unspecified: Secondary | ICD-10-CM

## 2024-03-09 DIAGNOSIS — N952 Postmenopausal atrophic vaginitis: Secondary | ICD-10-CM

## 2024-03-09 DIAGNOSIS — N3946 Mixed incontinence: Secondary | ICD-10-CM

## 2024-03-09 DIAGNOSIS — Z960 Presence of urogenital implants: Secondary | ICD-10-CM | POA: Diagnosis not present

## 2024-03-09 MED ORDER — TROSPIUM CHLORIDE ER 60 MG PO CP24: 1.0000 | ORAL_CAPSULE | Freq: Every evening | ORAL | 3 refills | Status: AC

## 2024-03-09 NOTE — Patient Instructions (Addendum)
 You are opting to continue a short stem 2 1/2 gellhorn pessary. This will keep the bulge inside and prevent it from getting worse.   Please return if you experience any change in urinary or vaginal symptoms.   Increase your fiber supplementation to optimize stool consistency.   Continue vaginal estrogen 1g twice a week.   Continue Trospium  daily.

## 2024-03-09 NOTE — Assessment & Plan Note (Signed)
-   uses short stemmed 2 1/2 gellhorn pessary with anterior vaginal wall at 0, reduced prolapse symptoms with 2 3/4in long stem gellhorn - For treatment of pelvic organ prolapse, we discussed options for management including expectant management, conservative management, and surgical management, such as Kegels, a pessary, pelvic floor physical therapy, and specific surgical procedures. - currently not sexually active due to space occupying pessary in place, reviewed surgical options if she desires to proceed. Pt desires to continue pessary management due to symptomatic relief at this time

## 2024-03-09 NOTE — Assessment & Plan Note (Signed)
-   discussed risk of change in urinary or bowel symptoms, vaginal ulceration, discharge, bleeding, fistula formation. Explained that pt may require multiple sizes and types for fitting.  - pt reports discomfort at time of pessary management, encouraged pt to use topical lidocaine at the time of pessary management to reduce discomfort - continue vaginal estrogen 1g 3x/week for therapeutic dosing - return in 3 months for pessary check

## 2024-03-09 NOTE — Assessment & Plan Note (Signed)
-   For symptomatic vaginal atrophy options include lubrication with a water-based lubricant, personal hygiene measures and barrier protection against wetness, and estrogen replacement in the form of vaginal cream, vaginal tablets, or a time-released vaginal ring.   - continue low dose vaginal estrogen 1g 2-3x/week due to prior lower urinary tract symptoms and pessary use

## 2024-03-09 NOTE — Assessment & Plan Note (Signed)
-   uses probiotics and stool softener every 1-2 day - For constipation, we reviewed the importance of a better bowel regimen.  We also discussed the importance of avoiding chronic straining, as it can exacerbate her pelvic floor symptoms; we discussed treating constipation and straining prior to surgery, as postoperative straining can lead to damage to the repair and recurrence of symptoms. We discussed initiating therapy with increasing fluid intake, fiber supplementation, stool softeners, and laxatives such as miralax.  - encouraged to continue titration of fiber supplementation to optimize stool consistency due to starting Trospium  - discussed association with pelvic floor disorders - avoid straining and encouraged squatty potty

## 2024-03-09 NOTE — Assessment & Plan Note (Signed)
-   04/02/23 POCT UA negative, bladder scan . Repeat with 2 3/4in long stem gellhorn pessary and 8mL on repeat - urgency > stress prior to pessary refitting, now 1x/day with 1st void. Prior discussion of sleep study due to snoring - SUI resolved after bulkamid 11/18/23 - We discussed the symptoms of overactive bladder (OAB), which include urinary urgency, urinary frequency, nocturia, with or without urge incontinence.  While we do not know the exact etiology of OAB, several treatment options exist. We discussed management including behavioral therapy (decreasing bladder irritants, urge suppression strategies, timed voids, bladder retraining), physical therapy, medication; for refractory cases posterior tibial nerve stimulation, sacral neuromodulation, and intravesical botulinum toxin injection.  For anticholinergic medications, we discussed the potential side effects of anticholinergics including dry eyes, dry mouth, constipation, cognitive impairment and urinary retention. For Beta-3 agonist medication, we discussed the potential side effect of elevated blood pressure which is more likely to occur in individuals with uncontrolled hypertension. - continue fluid management, caffeine reduction - continue Trospium  60mg  XL due to relief, Rx refill to [facility] due to cost - For treatment of stress urinary incontinence,  non-surgical options include expectant management, weight loss, physical therapy, as well as a pessary.  Surgical options include a midurethral sling, Burch urethropexy, and transurethral injection of a bulking agent. - continue low dose vaginal estrogen 2-3x/week - encouraged Kegel exercises

## 2024-03-27 ENCOUNTER — Ambulatory Visit: Admitting: Nurse Practitioner

## 2024-06-16 ENCOUNTER — Ambulatory Visit: Admitting: Obstetrics

## 2024-08-15 ENCOUNTER — Ambulatory Visit: Admitting: Nurse Practitioner

## 2024-08-16 ENCOUNTER — Ambulatory Visit: Admitting: Nurse Practitioner
